# Patient Record
Sex: Female | Born: 1985 | Race: White | Hispanic: No | Marital: Single | Smoking: Former smoker
Health system: Southern US, Community
[De-identification: ages and names within clinical notes are randomized; demographics above are authoritative.]

## PROBLEM LIST (undated history)

## (undated) DIAGNOSIS — F32A Depression, unspecified: Secondary | ICD-10-CM

## (undated) DIAGNOSIS — F319 Bipolar disorder, unspecified: Secondary | ICD-10-CM

## (undated) DIAGNOSIS — F329 Major depressive disorder, single episode, unspecified: Secondary | ICD-10-CM

## (undated) DIAGNOSIS — F419 Anxiety disorder, unspecified: Secondary | ICD-10-CM

## (undated) DIAGNOSIS — I10 Essential (primary) hypertension: Secondary | ICD-10-CM

## (undated) HISTORY — PX: TONSILLECTOMY: SUR1361

## (undated) HISTORY — PX: WISDOM TOOTH EXTRACTION: SHX21

## (undated) HISTORY — DX: Bipolar disorder, unspecified: F31.9

## (undated) HISTORY — PX: DILATION AND CURETTAGE OF UTERUS: SHX78

---

## 2007-02-26 ENCOUNTER — Other Ambulatory Visit: Admission: RE | Admit: 2007-02-26 | Discharge: 2007-02-26 | Payer: Self-pay | Admitting: Family Medicine

## 2008-05-30 ENCOUNTER — Inpatient Hospital Stay (HOSPITAL_COMMUNITY): Admission: EM | Admit: 2008-05-30 | Discharge: 2008-05-31 | Payer: Self-pay | Admitting: Emergency Medicine

## 2011-03-22 ENCOUNTER — Inpatient Hospital Stay (HOSPITAL_COMMUNITY): Admission: AD | Admit: 2011-03-22 | Payer: Self-pay | Source: Home / Self Care | Admitting: Family Medicine

## 2011-04-05 NOTE — Consult Note (Signed)
Ana Best, WARWICK             ACCOUNT NO.:  [id number]   MEDICAL RECORD NO.:  [id number]          PATIENT TYPE:  INP   LOCATION:  2917                         FACILITY:  [facility]   PHYSICIAN:  Antonietta Breach, M.D.  DATE OF BIRTH:  1986/11/03   DATE OF CONSULTATION:  05/30/2008  DATE OF DISCHARGE:                                 CONSULTATION   REASON FOR CONSULTATION:  Drug overdose.   REQUESTING PHYSICIAN:  [employer], Ramiro Harvest.   HISTORY OF PRESENT ILLNESS:  Ms. Lagan is a 25 year old female  admitted to the [facility] on May 30, 2008, after a heroin  overdose.   The patient states that she drank too much alcohol and then took her  normal quantity of heroin IV.  She had no intention of killing herself.  She has not had any suicidal thoughts.  She concludes that the heroin  that she received was unusually potent.  The patient was discovered by a  friend quickly after the patient became comatose.  The friend applied  CPR.  By the time that emergency medical services arrived, the patient  was in cardiac arrest.  CPR was continued, and the patient was  intubated.  She became conscious and try to extubate herself in the  emergency room.  Once she was consistently conscious, she was extubated.   By the time of the undersigned's visit, the patient has remained alert,  oriented and extubated.   The patient does not have any thoughts of harming herself or others.  She has no hallucinations or delusions.  Her orientation is intact.  Her  memory function is normal.  She is cooperative with bedside care.   She has constructive regret about her substance abuse and a desire to  enter sobriety and abstinence from substance abuse.   PAST PSYCHIATRIC HISTORY:  The patient has successfully been treated  with Wellbutrin for depression.  She states that her maintenance dosage  is 150 mg SR b.i.d.  Previous trials with Prozac resulted in a very  negative emotional  state.   Therefore, for her anxiety, she has been treated with Klonopin.  She  denies ever abusing the Klonopin.  She states that she rarely uses  alcohol.   She states that her anxiety is intolerable and that the Klonopin has  helped her to maintain a normal life.   She does admit that she became involved in the heroin habit recently  with her ex-boyfriend.   FAMILY PSYCHIATRIC HISTORY:  None known.   SOCIAL HISTORY:  The patient has 3-1/2 years of college.  She is getting  ready to re-enroll at [employer].  She has single. She has been staying with  a friend.   PAST MEDICAL HISTORY:  Status post heroin overdose and cardiac arrest.   MEDICATIONS:  MAR is reviewed.  The patient is on:  1. Wellbutrin 100 mg b.i.d.  2. Klonopin 0.5 mg b.i.d.  3. She is also on an Ativan taper.  4. Thiamine 100 mg daily.  5. A multivitamin daily.   No known drug allergies.   Her TCA screen  was positive.  INR normal.  Platelets 273, hemoglobin  12.1.  SGOT 35, SGPT 23.  Urine drug screen was positive for THC,  opioids and benzodiazepines.  Alcohol level was 145 upon arrival to the  emergency room.  Urine HCG negative.  Tylenol and aspirin negative.   REVIEW OF SYSTEMS:  Noncontributory.   MENTAL STATUS EXAM:  Ms. Louis is alert and oriented to all spheres.  Her affect is broad and appropriate.  Mood within normal limits.  Memory  function intact except for the blackout.  Speech normal.  Thought  process:  Logical, coherent, goal-directed.  No looseness of  associations.  Thought content:  No thoughts of harming herself, no  thoughts of harming others, no delusions, no hallucinations.  Insight is  intact.  Judgment intact.   ASSESSMENT:  AXIS I:  293.84 anxiety disorder not otherwise specified.  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  Opioid dependence, polysubstance dependence.  AXIS II:  Deferred.  AXIS III:  See past medical history above.  AXIS IV:  Primary support  group.  AXIS V:  55.   Ms. Tineo is not at risk to harm herself or others.  She does agree to  call emergency services immediately for any thoughts of harming herself,  thoughts of harming others or other psychiatric emergency symptoms.   The undersigned provided ego-supportive psychotherapy and education.  The undersigned recommended that the patient enter an inpatient chemical  dependency rehabilitation program.  However, the patient declined, and  she is no longer committable now that she is recovered from her  intoxicated state.   The patient wants to enter outpatient 12-step method and groups.   RECOMMENDATIONS:  Would increase the patient's Wellbutrin back to 150 mg  SR p.o. b.i.d. which has been her affect antidepressant prevention  dosage.  Would ask the social worker to set this patient up with  outpatient psychiatric follow-up which can be found at one of the  clinics attached to [facility], [facility] or [facility].  Also, the [facility] mental health center is an option.   Would ask the social worker to help this patient obtain 12-step  materials and information on meetings.     Antonietta Breach, M.D.  Electronically Signed    JW/MEDQ  D:  05/30/2008  T:  05/30/2008  Job:  [id number]

## 2011-04-05 NOTE — Discharge Summary (Signed)
[name], Best NO.:  [id number]   MEDICAL RECORD NO.:  [id number]          PATIENT TYPE:  INP   LOCATION:  2917                         FACILITY:  [facility]   PHYSICIAN:  Ramiro Harvest, MD    DATE OF BIRTH:  1985/12/05   DATE OF ADMISSION:  05/30/2008  DATE OF DISCHARGE:  05/31/2008                               DISCHARGE SUMMARY   ATTENDING PHYSICIAN:  Ramiro Harvest, MD.   PRIMARY CARE PHYSICIAN:  Duncan Dull, MD, of [facility].   DISCHARGE DIAGNOSES:  1. Drug overdose.  2. Depression.  3. Anxiety.  4. Polysubstance abuse.  5. Hypokalemia.   DISCHARGE MEDICATIONS:  1. Wellbutrin SR 150 mg p.o. b.i.d.  2. Klonopin 0.5 mg 1-1/2 tablets p.o. q.a.m. and q.p.m.  3. Ambien 10 mg p.o. nightly.  4. Tylenol Extra Strength 500 mg 1 tablet every 4 hours as needed for      pain.   DISPOSITION AND FOLLOWUP:  The patient will be discharged home under the  care of her mother to her mother's home.  The patient was advised of a  12-step program.  The patient will need outpatient psychiatric followup  at one of the clinics attached to [facility] or  [facility] as an option.  The  patient is also to follow up with PCP in 2 weeks, followup basic  metabolic profile needs to be checked, to followup on the patient's  electrolytes, and also to followup on the patient's polysubstance abuse  to make sure that the patient has followed up with outpatient  psychiatric program and our possible 12-step program as well.   CONSULTANTS:  Dr. Jeanie Sewer of psychiatry was consulted.  The patient  was seen on May 30, 2008.   PROCEDURES PERFORMED:  A chest x-ray was performed on May 30, 2008,  that showed no evidence of acute cardiopulmonary disease.   BRIEF ADMISSION HISTORY AND PHYSICAL:  Ana Best is a 25 year old  white female with history of polysubstance abuse who presented to the  ED; via EMS, in  cardiopulmonary arrest secondary to a drug overdose.  History was obtained from the patient and per ED records.  The patient  stated that she shot a dose of heroin the night prior to admission and  went unresponsive and cannot remember after that.  Per records of  friends/family members, started CPR and called 911; when paramedics  arrived, the patient was unresponsive, apneic, and pulseless.  The  patient was intubated immediately and CPR was continued for 5 minutes.  Pulse was noted and the blood pressure 115 was palpated.  The patient  was brought to the emergency room.  In the emergency room, the patient  was following commands, alert and oriented, and seemed neurologically  intact and thus extubated.  The patient was talking and following  commands.  The patient states that she had been under a lot of stress  recently and had used a dose of heroin.  The patient denied any chest  pain.  No shortness of breath, no nausea, no vomiting, no abdominal  pain, no hematemesis, no hematochezia, no focal neurological deficits,  and no other associated symptoms.  Labs obtained in the ED showed a EKG  with normal sinus rhythm.  Chest x-ray was normal.  CBC within normal  limits.  Sodium of 132, potassium of 2.9, chloride of 98, bicarb 80, BUN  9, creatinine 0.79, glucose 167, bilirubin 1.3, and rest of the liver  function tests were within normal limits.  Point-of-care cardiac markers  were negative.  UA was planned.  Acetaminophen level was less than 10.  Salicylate level was less than 4.  Alcohol level was 145.  UDS was  positive for benzos, opiates, and THCs.  Serum TCA's were also positive.  We called to admit the patient for further evaluation and management.   PHYSICAL EXAMINATION ON ADMISSION:  VITAL SIGNS:  Temperature 97.6,  blood pressure 135/78, pulse of 99, respiratory rate 16, and satting  100% on 2 L nasal cannula.  GENERAL:  The patient in bed, crying, speaking in full sentences,  in no  apparent distress.  HEENT:  Normocephalic and atraumatic.  Pupils are equal, round, and  reactive to light.  Sluggishly, extraocular movements intact.  Oropharynx was clear.  No lesions.  No exudate.  NECK:  Supple.  No lymphadenopathy.  LUNGS:  Clear to auscultation bilaterally.  No wheezes, crackles, or  rhonchi.  CARDIOVASCULAR:  Regular rate and rhythm.  No murmurs, rubs, or gallops.  ABDOMEN:  Soft, nontender, and nondistended.  Positive bowel sounds.  No  rebound or guarding.  EXTREMITIES:  No clubbing, cyanosis, or edema.  NEURO:  The patient was alert and oriented x3.  Cranial nerves II-XII  were grossly intact.  No focal deficits.  Sensation is intact.  Cerebellum is intact.  Gait was not tested.   ADMISSION LABORATORY DATA:  White count 9.7, hemoglobin 12.1, platelets  273, hematocrit 35.4, sodium 132, potassium 2.9, chloride 98, bicarb 18,  BUN 9, creatinine 0.79, glucose 167, calcium of 8.5, bilirubin 1.3, alk  phosphatase 48, AST 35, ALT 23, protein 6.8, and albumin 4.4.  Point-of-  care cardiac markers; CK-MB less than 1, troponin-I less than 0.05, and  myoglobin 30.7.  Urine pregnancy was negative.  Acetaminophen level was  less than 10.  Salicylate level less than 4.  Alcohol level 145.  Amylase 20.  UA was yellow and clear.  Specific gravity of 1.011, pH of  5, glucose negative, bilirubin negative, ketones negative, blood  negative, protein negative, urobilinogen 0.2, nitrite negative, and  leukocytes negative.  UDS was positive for benzos, opiates, and THCs.  Serum tricyclic was positive.  Chest x-ray showed no evidence of acute  cardiopulmonary disease.  EKG was normal sinus rhythm with a T-wave  inversion in lead III only.   HOSPITAL COURSE:  1. Drug overdose.  The patient was admitted to the step-down unit for      close observation.  Urine checked every 2 hours and magnesium level      was checked, which was 2.0.  TSH was checked, which was within       normal limits.  The patient was placed on seizure precautions,      placed on oxygen, and IV fluids.  The patient's potassium was also      monitored as well.  The patient remained stable and psychiatric      consult was also obtained.  The patient was seen in consultation by      Dr. Jeanie Sewer for drug overdose.  It  was felt that the patient was      not at risk to harm herself or others.  The patient agreed to call      emergency services immediately if she had any thoughts of hurting      herself or hurting others, and also to call for other psychiatric      emergency symptoms.  The patient was provided some supportive      psychotherapy information and education.  It was recommended to the      patient to enter inpatient chemical dependency rehab program;      however, the patient declined.  The patient was not committable at      that time and the patient had recovered from her intoxicated state.      The patient wanted to attend outpatient 12-method step group and      the patient was also advised to follow up with outpatient      psychiatric group.  The patient remained stable.  There were no      evidence of seizures throughout the hospitalization.  The patient's      respiratory status was stable throughout the hospitalization.  The      patient did not have any arrhythmias during the hospitalization and      the patient will be discharged in stable and improved condition.  2. Depression, stable.  The patient was maintained on her home dose of      Wellbutrin SR 150 mg twice a day.  3. Anxiety, stable.  The patient was put on some Ativan as needed, and      the patient was monitored.  4. Polysubstance abuse, stable.  See on problem #1.  5. Hypokalemia.  The patient's potassium was repeated and magnesium      level was checked, which was within normal limits.  The patient      will be discharged home in stable and improved condition to the      care of her mother.   DISCHARGE  VITAL SIGNS:  Vital signs on the day of discharge; temperature  98.3, blood pressure 102/56, pulse of 65, respiratory rate 17, and  satting 98% on room air.   DISCHARGE LABS:  Sodium 136, potassium 3.7, chloride 106, bicarb 23, BUN  4, creatinine 0.63, glucose of 77, bilirubin 1.3, alk phosphatase 43,  AST 20, ALT 18, protein 5.6, albumin 3.5, and calcium of 8.3.  CBC:  White count 9.0, hemoglobin 10.8, platelets 201, hematocrit 31.8, and  magnesium of 2.0.   It was a pleasure taking care of Ana Best.      Ramiro Harvest, MD  Electronically Signed     DT/MEDQ  D:  05/31/2008  T:  05/31/2008  Job:  [id number]   cc:   Duncan Dull, M.D.  Antonietta Breach, M.D.

## 2011-04-05 NOTE — H&P (Signed)
NAMEOMAYA, Best             ACCOUNT NO.:  [id number]   MEDICAL RECORD NO.:  [id number]          PATIENT TYPE:  INP   LOCATION:  2917                         FACILITY:  [facility]   PHYSICIAN:  Donnetta Hutching, MD         DATE OF BIRTH:  [birth year]   DATE OF ADMISSION:  05/30/2008  DATE OF DISCHARGE:                              HISTORY & PHYSICAL   PRIMARY CARE PHYSICIAN:  Duncan Dull, M.D.   HISTORY OF PRESENT ILLNESS:  Ana Best is a 25 year old white  female with history of polysubstance abuse who presented to the ED via  EMS in cardiopulmonary arrest secondary to a drug overdose.  History was  obtained from the patient and ED records.  The patient states she shot a  dose of heroin last night and went unresponsive.  Per records,  friends/family members started CPR and called 911.  When paramedics  arrived, the patient was unresponsive, apneic, and pulseless.  The  patient was intubated immediately, and CPR was continued for 5 minutes,  pulse was noted, and a blood pressure of 115 was palpated.  The patient  was brought to the emergency room.  In the ED, the patient was pulling  at the tube,  following commands, alert and oriented, and seemed  neurologically intact.  The patient was thus extubated, talking, and  following commands.  The patient states that she has been under a lot of  stress recently. Patient denied any chest pain, no SOB, no cough, no  nausea no diarrhea, no constipation, no abdominal pain, no hemetemesis,  no melena, no hematochezia, no focal neurological sxs, no other  associated symptoms.   Labs obtained in the ED:  EKG normal sinus rhythm.  Chest x-ray was  normal.  CBC was within normal limits.  Sodium of 132, potassium of 2.9,  chloride of 98, bicarb 80, BUN 9, creatinine 0.79, glucose 167,  bilirubin 1.3.  Rest of the liver function tests were within normal  limits.  Point-of-care cardiac markers were negative.  UA was bland.  Acetaminophen level  less than 10.  Salicylate level less than 4.  Alcohol level 145.  UDS was positive for benzos, opiates, and THCs.  TCAs was also positive.  We were called to admit the patient for further  evaluation and management.   ALLERGIES:  No known drug allergies.   PAST MEDICAL HISTORY:  1. Polysubstance abuse.  2. Depression.  3. Anxiety.   HOME MEDICATIONS:  1. The patient is on Wellbutrin, dose unknown.  2. Klonopin 0.5 mg p.o. q.a.m. and q.p.m.  3. Ambien 10 mg p.o. nightly.   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  Positive tobacco use, positive alcohol use, positive  IV drug use of heroin.  Also, uses cocaine, marijuana, and ecstasy.   FAMILY HISTORY:  Mother alive at age 58 and healthy.  Father alive at  age 53 with anxiety.  One brother alive at age 66 with OCD.   REVIEW OF SYSTEMS:  As per HPI, otherwise negative.  A 14-point review  of systems reviewed and was negative.   PHYSICAL EXAMINATION:  VITALS:  Temperature 97.6, blood pressure 135/78,  pulse of 99, respiratory rate 16, satting 100% on 2 L nasal cannula.  GENERAL:  Patient in bed, crying, speaking in full sentences, in no  apparent distress.  HEENT:  Normocephalic, atraumatic.  Pupils equal, round, and reactive to  light sluggishly.  Extraocular movements intact.  Oropharynx is clear.  No lesions, no exudates.  NECK:  Supple.  No lymphadenopathy.  LUNGS:  Clear to auscultation bilaterally.  No wheezes, no crackles.  No  rhonchi.  CARDIOVASCULAR:  Regular rate and rhythm.  No murmurs, rubs, or gallops.  ABDOMEN:  Soft, nontender, nondistended.  Positive bowel sounds.  No  rebound, no guarding.  EXTREMITIES:  No clubbing, cyanosis or edema.  NEUROLOGIC:  The patient is alert and oriented x3.  Cranial nerves II  through XII are grossly intact.  No focal deficit.  Sensation is intact.  Cerebellum is intact.  Gait not tested.   LABORATORY DATA:  CBC:  White count  9.7, hemoglobin 12.1, platelets 273,  hematocrit 35.4, sodium 132, potassium 2.9, chloride 98, bicarbonate 18,  BUN 9, creatinine 0.79, glucose 167, calcium of 8.5, bilirubin 1.3, alk  phosphatase 48, AST 35, ALT 23, protein 6.8, and albumin 4.4.  Point-of-  care cardiac markers, CK-MB less than 1, troponin I less than 0.05,  myoglobin 30.7.  Urine pregnancy test negative.  Acetaminophen level  less than 10.  Salicylate level less than 4.  Alcohol level 145.  Amylase of 20.  UA was yellow, clear, specific gravity 1.011, pH of 5,  glucose negative, bilirubin negative, ketones negative, blood negative,  protein negative, urobilinogen 0.2, nitrite negative, and leukocytes  negative.  UDS positive for benzos, opiates, and THC.  Serum tricyclics  was positive.  Chest x-ray:  No evidence of acute cardiopulmonary  disease.  EKG with normal sinus rhythm and T-wave inversion in lead III  only.   ASSESSMENT AND PLAN:  Ana Best is a 25 year old female with past  medical history of depression, anxiety, and polysubstance abuse who  presents to the ED with a cardiopulmonary arrest after a drug overdose.  1. Drug overdose.  We will admit the patient to step-down unit for      close observation.  Neuro checks every 2 hours.  Check a magnesium      level.  Check a TSH.  Check coags, seizure precautions.  Place on      oxygen, IV fluids.  Replete potassium and monitor.  We will get a      psychiatric consult for further evaluation.  The patient also is      referred for psychiatric evaluation.  The patient denies any      suicidal or homicidal ideation.  2. Depression.  The patient denies any suicidal or homicidal ideation.      We will continue home dose Wellbutrin.  We will get a psychiatric      consult.  3. Anxiety.  Continue home dose Klonopin.  4. Polysubstance abuse.  Monitor in step-down unit.  Seizure      precautions.  Ativan, withdrawal protocol, and psych consult.  5. Hypokalemia.   Check a magnesium level.  Replete potassium.  Recheck      BMET at 1300 hours.  6. Prophylaxis.  Protonix for GI prophylaxis.  SCDs for DVT      prophylaxis.   It has been a pleasure taking care of Ana Best.  Ramiro Harvest, MD  Electronically Signed     ______________________________  Donnetta Hutching, MD    DT/MEDQ  D:  05/30/2008  T:  05/30/2008  Job:  [id number]   cc:   Duncan Dull, M.D.

## 2011-08-18 LAB — RAPID URINE DRUG SCREEN, HOSP PERFORMED
Barbiturates: NOT DETECTED
Opiates: POSITIVE — AB
Tetrahydrocannabinol: POSITIVE — AB

## 2011-08-18 LAB — POCT PREGNANCY, URINE
Operator id: 277751
Preg Test, Ur: NEGATIVE

## 2011-08-18 LAB — DIFFERENTIAL
Lymphocytes Relative: 44
Monocytes Absolute: 0.7
Monocytes Relative: 8
Neutro Abs: 4.6

## 2011-08-18 LAB — URINALYSIS, ROUTINE W REFLEX MICROSCOPIC
Glucose, UA: NEGATIVE
Hgb urine dipstick: NEGATIVE
Specific Gravity, Urine: 1.011
Urobilinogen, UA: 0.2
pH: 5

## 2011-08-18 LAB — CBC
HCT: 35.4 — ABNORMAL LOW
MCHC: 34
MCV: 89.9
Platelets: 201
Platelets: 273
RDW: 11.8

## 2011-08-18 LAB — COMPREHENSIVE METABOLIC PANEL
AST: 20
Albumin: 4.4
Alkaline Phosphatase: 48
BUN: 4 — ABNORMAL LOW
BUN: 9
CO2: 23
Calcium: 8.3 — ABNORMAL LOW
Creatinine, Ser: 0.63
Creatinine, Ser: 0.79
GFR calc Af Amer: >60
GFR calc non Af Amer: >60
Potassium: 2.9 — ABNORMAL LOW
Total Protein: 6.8

## 2011-08-18 LAB — ACETAMINOPHEN LEVEL: Acetaminophen (Tylenol), Serum: <10 — ABNORMAL LOW

## 2011-08-18 LAB — POCT CARDIAC MARKERS
CKMB, poc: <1 — ABNORMAL LOW
Myoglobin, poc: 30.7
Troponin i, poc: <0.05

## 2011-08-18 LAB — BASIC METABOLIC PANEL
BUN: 6
CO2: 24
Chloride: 105
Creatinine, Ser: 0.56

## 2011-08-18 LAB — MAGNESIUM: Magnesium: 2

## 2011-08-18 LAB — PROTIME-INR: Prothrombin Time: 16.4 — ABNORMAL HIGH

## 2011-08-18 LAB — AMYLASE: Amylase: 20 — ABNORMAL LOW

## 2011-08-31 ENCOUNTER — Inpatient Hospital Stay (HOSPITAL_COMMUNITY)
Admission: AD | Admit: 2011-08-31 | Discharge: 2011-09-02 | DRG: 759 | Disposition: A | Discharge status: Home / Self Care | Payer: PRIVATE HEALTH INSURANCE | Source: Ambulatory Visit | Attending: Family Medicine | Admitting: Family Medicine

## 2011-08-31 ENCOUNTER — Encounter (HOSPITAL_COMMUNITY): Payer: Self-pay

## 2011-08-31 DIAGNOSIS — N760 Acute vaginitis: Principal | ICD-10-CM | POA: Diagnosis present

## 2011-08-31 DIAGNOSIS — N764 Abscess of vulva: Secondary | ICD-10-CM

## 2011-08-31 DIAGNOSIS — N762 Acute vulvitis: Secondary | ICD-10-CM

## 2011-08-31 DIAGNOSIS — N949 Unspecified condition associated with female genital organs and menstrual cycle: Secondary | ICD-10-CM | POA: Diagnosis present

## 2011-08-31 HISTORY — DX: Anxiety disorder, unspecified: F41.9

## 2011-08-31 HISTORY — DX: Major depressive disorder, single episode, unspecified: F32.9

## 2011-08-31 HISTORY — DX: Depression, unspecified: F32.A

## 2011-08-31 LAB — BASIC METABOLIC PANEL
BUN: 10 mg/dL (ref 6–23)
Calcium: 9.3 mg/dL (ref 8.4–10.5)
GFR calc non Af Amer: >90 mL/min (ref >90)
Glucose, Bld: 100 mg/dL — ABNORMAL HIGH (ref 70–99)

## 2011-08-31 MED ORDER — SODIUM CHLORIDE 0.9 % IJ SOLN: 3.0000 mL | INTRAMUSCULAR | Status: DC | PRN

## 2011-08-31 MED ORDER — OXYCODONE-ACETAMINOPHEN 5-325 MG PO TABS
1.0000 | ORAL_TABLET | ORAL | Status: DC | PRN
  Administered 2011-08-31 – 2011-09-01 (×5): 2 via ORAL
  Administered 2011-09-02: 1 via ORAL
  Filled 2011-08-31 (×2): qty 2
  Filled 2011-08-31: qty 1
  Filled 2011-08-31 (×3): qty 2

## 2011-08-31 MED ORDER — SODIUM CHLORIDE 0.9 % IJ SOLN
3.0000 mL | Freq: Two times a day (BID) | INTRAMUSCULAR | Status: DC
  Administered 2011-09-01 (×3): 3 mL via INTRAVENOUS

## 2011-08-31 MED ORDER — ARIPIPRAZOLE 10 MG PO TABS
10.0000 mg | ORAL_TABLET | Freq: Every day | ORAL | Status: DC
  Administered 2011-08-31 – 2011-09-01 (×2): 10 mg via ORAL
  Filled 2011-08-31 (×2): qty 1

## 2011-08-31 MED ORDER — PROMETHAZINE HCL 25 MG/ML IJ SOLN: 12.5000 mg | INTRAMUSCULAR | Status: DC | PRN

## 2011-08-31 MED ORDER — MORPHINE SULFATE 4 MG/ML IJ SOLN: 1.0000 mg | INTRAMUSCULAR | Status: DC | PRN

## 2011-08-31 MED ORDER — ALUM & MAG HYDROXIDE-SIMETH 200-200-20 MG/5ML PO SUSP: 30.0000 mL | ORAL | Status: DC | PRN

## 2011-08-31 MED ORDER — MENTHOL 3 MG MT LOZG: 1.0000 | LOZENGE | OROMUCOSAL | Status: DC | PRN

## 2011-08-31 MED ORDER — VANCOMYCIN HCL IN DEXTROSE 1-5 GM/200ML-% IV SOLN
1000.0000 mg | Freq: Two times a day (BID) | INTRAVENOUS | Status: DC
  Administered 2011-08-31 – 2011-09-02 (×4): 1000 mg via INTRAVENOUS
  Filled 2011-08-31 (×5): qty 200

## 2011-08-31 MED ORDER — ARIPIPRAZOLE 10 MG PO TABS
10.0000 mg | ORAL_TABLET | Freq: Every day | ORAL | Status: DC
  Filled 2011-08-31 (×2): qty 1

## 2011-08-31 MED ORDER — TEMAZEPAM 15 MG PO CAPS: 15.0000 mg | ORAL_CAPSULE | Freq: Every evening | ORAL | Status: DC | PRN

## 2011-08-31 MED ORDER — BISACODYL 10 MG RE SUPP: 10.0000 mg | Freq: Every day | RECTAL | Status: DC | PRN

## 2011-08-31 MED ORDER — IBUPROFEN 600 MG PO TABS
600.0000 mg | ORAL_TABLET | Freq: Four times a day (QID) | ORAL | Status: DC | PRN
  Administered 2011-09-02: 600 mg via ORAL
  Filled 2011-08-31: qty 1

## 2011-08-31 MED ORDER — PNEUMOCOCCAL VAC POLYVALENT 25 MCG/0.5ML IJ INJ
0.5000 mL | INJECTION | INTRAMUSCULAR | Status: DC
  Filled 2011-08-31: qty 0.5

## 2011-08-31 MED ORDER — CLONAZEPAM 1 MG PO TABS: 2.0000 mg | ORAL_TABLET | Freq: Every evening | ORAL | Status: DC | PRN

## 2011-08-31 MED ORDER — LAMOTRIGINE 150 MG PO TABS
150.0000 mg | ORAL_TABLET | Freq: Every day | ORAL | Status: DC
  Administered 2011-08-31 – 2011-09-01 (×2): 150 mg via ORAL
  Filled 2011-08-31 (×2): qty 1

## 2011-08-31 MED ORDER — SODIUM CHLORIDE 0.9 % IV SOLN
250.0000 mL | INTRAVENOUS | Status: DC
  Administered 2011-08-31: 1000 mL via INTRAVENOUS

## 2011-08-31 MED ORDER — GUAIFENESIN 100 MG/5ML PO SOLN: 15.0000 mL | ORAL | Status: DC | PRN

## 2011-08-31 MED ORDER — DOCUSATE SODIUM 100 MG PO CAPS
100.0000 mg | ORAL_CAPSULE | Freq: Every day | ORAL | Status: DC
  Administered 2011-08-31 – 2011-09-02 (×3): 100 mg via ORAL
  Filled 2011-08-31 (×3): qty 1

## 2011-08-31 MED ORDER — LAMOTRIGINE 150 MG PO TABS
150.0000 mg | ORAL_TABLET | Freq: Every day | ORAL | Status: DC
  Filled 2011-08-31 (×2): qty 1

## 2011-08-31 NOTE — H&P (Signed)
  Ana Best is an 25 y.o. [id number] Caucasian female.   Chief Complaint: Labial swelling and pain HPI: Patient comes in today with 3 day h/o labial swelling.  She states that it started out as a bump and she tried to pop it.  It is on the right side.  It has gotten increasingly bigger and more painful.  It is not draining.  She has not taken anything for it.  She denies fever, chills, headache, nausea, vomiting.  Past Medical History  Diagnosis Date  . Anxiety   . Depression     Past Surgical History  Procedure Date  . Tonsillectomy   . Dilation and curettage of uterus   . Wisdom tooth extraction     No family history on file. Social History:  does not have a smoking history on file. She does not have any smokeless tobacco history on file. Her alcohol and drug histories not on file.  Allergies: No Known Allergies  No current facility-administered medications on file as of 08/31/2011.   No current outpatient prescriptions on file as of 08/31/2011.    No results found for this or any previous visit (from the past 48 hour(s)). No results found.  A comprehensive review of systems was negative except for: Constitutional: positive for fatigue and appetite change Neurological: positive for headaches  Blood pressure 103/72, pulse 102, temperature 98.7 F (37.1 C), temperature source Oral, resp. rate 20, height 5\' 5"  (1.651 m), weight 65.318 kg (144 lb), last menstrual period 08/16/2011. General: alert and cooperative Resp: clear to auscultation bilaterally Cardio: regular rate and rhythm, S1, S2 normal, no murmur, click, rub or gallop GI: soft, non-tender; bowel sounds normal; no masses,  no organomegaly Extremities: extremities normal, atraumatic, no cyanosis or edema GU: reveals erythematous, indurated right labia majora, no fluctuance. HEENT:  [place]/AT, sclera anicteric Neck:  Supple, Nml thyroid Neuro:  Oriented x 3 Skin: C/D/I except in GU.  WBC: 20K at Urgent Care  today.  Assessment/Plan Right Labial abscess Admit for pain control and IV antibiotics.  Vern Prestia S 08/31/2011, 12:32 PM

## 2011-08-31 NOTE — ED Provider Notes (Signed)
Chart reviewed and agree with management and plan.  

## 2011-08-31 NOTE — ED Provider Notes (Signed)
History     CSN: [id number] Arrival date & time: 08/31/2011  9:37 AM  Chief Complaint  Patient presents with  . Bartholin's Cyst    HPI Ana Best is a 25 y.o. female who was evaluated earlier today at [facility] for swelling and pain of right labia. Symptoms started 3 days ago with small knot. Patient tried to squeeze the area to see if it would drain and since the it has gotten much worse. Uses OC's for birth control. No history of STD's. Last pap smear 2 years ago and was normal.  Past Medical History  Diagnosis Date  . Anxiety   . Depression     Past Surgical History  Procedure Date  . Tonsillectomy   . Dilation and curettage of uterus   . Wisdom tooth extraction     No family history on file.  History  Substance Use Topics  . Smoking status: Not on file  . Smokeless tobacco: Not on file  . Alcohol Use:     OB History    Grav Para Term Preterm Abortions TAB SAB Ect Mult Living   2    2 2           Review of Systems  Constitutional: Positive for appetite change and fatigue. Negative for fever, chills and diaphoresis.  HENT: Negative for ear pain, congestion, sore throat, neck pain, neck stiffness, dental problem and sinus pressure.   Eyes: Negative for photophobia, pain and discharge.  Respiratory: Negative for cough, chest tightness and wheezing.   Cardiovascular: Negative.   Gastrointestinal: Negative for nausea, vomiting, abdominal pain, diarrhea, constipation and abdominal distention.  Genitourinary: Negative for dysuria, frequency, flank pain and difficulty urinating.       Swelling and redness of right labia.  Musculoskeletal: Positive for back pain. Negative for myalgias and gait problem.  Skin:       See GU  Neurological: Positive for headaches. Negative for dizziness, speech difficulty, weakness, light-headedness and numbness.  Psychiatric/Behavioral: Negative for confusion and agitation. The patient is nervous/anxious.    Bipolar    Allergies  Review of patient's allergies indicates no known allergies.  Home Medications  No current outpatient prescriptions on file.  BP 103/72  Pulse 102  Temp(Src) 98.7 F (37.1 C) (Oral)  Resp 20  Ht 5\' 5"  (1.651 m)  Wt 144 lb (65.318 kg)  BMI 23.96 kg/m2  LMP 08/16/2011  Physical Exam  Nursing note and vitals reviewed. Constitutional: She is oriented to person, place, and time. She appears well-developed and well-nourished.  HENT:  Head: Normocephalic.  Eyes: EOM are normal.  Neck: Neck supple.  Cardiovascular:       tachycardia  Pulmonary/Chest: Effort normal.  Abdominal: Soft. There is no tenderness.  Genitourinary:       Right labia with edema, erythema and tenderness. I can not appreciate any swelling of the Bartholin's gland. There is no fluctuant area at this time to drain.   Musculoskeletal: Normal range of motion.       Right inguinal nodes tender.  Neurological: She is alert and oriented to person, place, and time. No cranial nerve deficit.  Skin: Skin is warm and dry.   Per Urgent Care documents the patient has an elevated white count. ED Course: Consult with Dr. Shawnie Pons and she evaluated the patient in [facility]. Will admit for IV antibiotics.  Procedures   Assessment: Right labial abscess   Cellulitis  Plan:  Admit for IV antibiotics   Dr. Shawnie Pons witting  admission orders. MDM          [name], NP 08/31/11 1245

## 2011-08-31 NOTE — Progress Notes (Signed)
Pt states noted small cyst 3 days ago, squeezed it, then cyst became larger. Whole r side of labia is red and swollen. Denies vag d/c changes. Did try warm bath.

## 2011-08-31 NOTE — Progress Notes (Signed)
Pt states she was seen at the [facility] and sent to [facility] for a Bartholin cyst. Pt is in a lot of pain and has been unable to sleep.

## 2011-09-01 DIAGNOSIS — N764 Abscess of vulva: Secondary | ICD-10-CM

## 2011-09-01 LAB — CBC
HCT: 32.8 % — ABNORMAL LOW (ref 36.0–46.0)
MCH: 30.6 pg (ref 26.0–34.0)
MCV: 92.9 fL (ref 78.0–100.0)
Platelets: 264 10*3/uL (ref 150–400)
RBC: 3.53 MIL/uL — ABNORMAL LOW (ref 3.87–5.11)
RDW: 12.8 % (ref 11.5–15.5)
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
## 2011-09-01 NOTE — Progress Notes (Signed)
  Ana Best is a 25 y.o. female patient. 1. Labial abscess    Past Medical History  Diagnosis Date  . Anxiety   . Depression    Current Facility-Administered Medications  Medication Dose Route Frequency Provider Last Rate Last Dose  . 0.9 %  sodium chloride infusion  250 mL Intravenous Continuous Reva Bores, MD 1 mL/hr at 08/31/11 1328 1,000 mL at 08/31/11 1328  . alum & mag hydroxide-simeth (MAALOX/MYLANTA) 200-200-20 MG/5ML suspension 30 mL  30 mL Oral Q4H PRN Reva Bores, MD      . ARIPiprazole (ABILIFY) tablet 10 mg  10 mg Oral QHS Reva Bores, MD   10 mg at 08/31/11 2214  . bisacodyl (DULCOLAX) suppository 10 mg  10 mg Rectal Daily PRN Reva Bores, MD      . clonazePAM [name]) tablet 2 mg  2 mg Oral QHS PRN Reva Bores, MD      . docusate sodium (COLACE) capsule 100 mg  100 mg Oral Daily Reva Bores, MD   100 mg at 08/31/11 1637  . guaiFENesin (ROBITUSSIN) 100 MG/5ML solution 300 mg  15 mL Oral Q4H PRN Reva Bores, MD      . ibuprofen (ADVIL,MOTRIN) tablet 600 mg  600 mg Oral Q6H PRN Reva Bores, MD      . lamoTRIgine (LAMICTAL) tablet 150 mg  150 mg Oral QHS Reva Bores, MD   150 mg at 08/31/11 2214  . menthol-cetylpyridinium (CEPACOL) lozenge 3 mg  1 lozenge Oral Q2H PRN Reva Bores, MD      . morphine 4 MG/ML injection 1-2 mg  1-2 mg Intravenous Q3H PRN Reva Bores, MD      . oxyCODONE-acetaminophen (PERCOCET) 5-325 MG per tablet 1-2 tablet  1-2 tablet Oral Q3H PRN Reva Bores, MD   2 tablet at 08/31/11 1832  . pneumococcal 23 valent vaccine (PNU-IMMUNE) injection 0.5 mL  0.5 mL Intramuscular Tomorrow-1000 Reva Bores, MD      . promethazine (PHENERGAN) injection 12.5 mg  12.5 mg Intravenous Q4H PRN Reva Bores, MD      . sodium chloride 0.9 % injection 3 mL  3 mL Intravenous Q12H Reva Bores, MD   3 mL at 09/01/11 0154  . sodium chloride 0.9 % injection 3 mL  3 mL Intravenous PRN Reva Bores, MD      . temazepam (RESTORIL) capsule 15-30  mg  15-30 mg Oral QHS PRN Reva Bores, MD      . vancomycin (VANCOCIN) IVPB 1000 mg/200 mL premix  1,000 mg Intravenous Q12H Reva Bores, MD   1,000 mg at 09/01/11 0154  . DISCONTD: ARIPiprazole (ABILIFY) tablet 10 mg  10 mg Oral Daily Reva Bores, MD      . DISCONTD: lamoTRIgine (LAMICTAL) tablet 150 mg  150 mg Oral Daily Reva Bores, MD       No Known Allergies Active Problems:  * No active hospital problems. *   Blood pressure 94/54, pulse 68, temperature 98.5 F (36.9 C), temperature source Oral, resp. rate 18, height 5\' 5"  (1.651 m), weight 65.772 kg (145 lb), last menstrual period 08/16/2011, SpO2 97.00%.  Subjective: Still having some shooting pains in vulva, but overall somewhat improved  Objective: generalized fullness of left vulva, no discrete pocket Assessment & Plan: Left labial abscess- change to dicloxacillin as she is MRSA negative  [name] C. 09/01/2011

## 2011-09-01 NOTE — Progress Notes (Signed)
  Pt examined at 9 am today.  Area marked with black marker.  At 7 pm, the area is slightly improved (approx 10%) better.  Pt still having lots of pain.  No fluctuant area for I & D.  Pt advised to stay and receive more IV antibiotics.  Pt and mother agreeable.  Reiterated no more shaving. Adelae Yodice H. 7:11 PM

## 2011-09-02 DIAGNOSIS — N762 Acute vulvitis: Secondary | ICD-10-CM

## 2011-09-02 MED ORDER — OXYCODONE-ACETAMINOPHEN 5-325 MG PO TABS: 1.0000 | ORAL_TABLET | ORAL | Status: AC | PRN

## 2011-09-02 MED ORDER — SULFAMETHOXAZOLE-TRIMETHOPRIM 800-160 MG PO TABS: 1.0000 | ORAL_TABLET | Freq: Two times a day (BID) | ORAL | Status: AC

## 2011-09-02 NOTE — Discharge Summary (Signed)
Physician Discharge Summary  Patient ID: Ana Best MRN: [id number] DOB/AGE: [birth year] 25 y.o.  Admit date: 08/31/2011 Discharge date: 09/02/2011  Admission Diagnoses:  Discharge Diagnoses:  Active Problems:  Cellulitis of labia majora   Discharged Condition: good  Hospital Course: Pt admitted 2 days ago for pain and swelling of left labia.  Pt had elevated white count.  Diagnosis consistent with labia majora cellulitis.  There was no fluctuant area.  Pt placed on antibiotics for 2 days with 75% improvement.  Will continue antibiotics for 10 days at home.  Consults: none  Significant Diagnostic Studies: labs: cbc  Treatments: antibiotics: vancomycin  Discharge Exam: Blood pressure 95/58, pulse 69, temperature 97.7 F (36.5 C), temperature source Oral, resp. rate 16, height 5\' 5"  (1.651 m), weight 68.04 kg (150 lb), last menstrual period 08/16/2011, SpO2 97.00%.  See progress note  Disposition:    Current Discharge Medication List    START taking these medications   Details  oxyCODONE-acetaminophen (ROXICET) 5-325 MG per tablet Take 1 tablet by mouth every 4 (four) hours as needed for pain. Qty: 15 tablet, Refills: 0    sulfamethoxazole-trimethoprim (BACTRIM DS) 800-160 MG per tablet Take 1 tablet by mouth 2 (two) times daily. Qty: 20 tablet, Refills: 0      CONTINUE these medications which have NOT CHANGED   Details  ARIPiprazole (ABILIFY) 10 MG tablet Take 10 mg by mouth daily.      clonazePAM (KLONOPIN) 2 MG tablet Take 2 mg by mouth at bedtime as needed. Patient takes for anxiety     lamoTRIgine (LAMICTAL) 150 MG tablet Take 150 mg by mouth daily.         Follow-up Information    Follow up with [facility] [place]. Make an appointment in 2 weeks. [phone])          SignedElsie Lincoln H. 09/02/2011, 7:16 AM

## 2011-09-02 NOTE — Progress Notes (Signed)
Subjective: Patient reports decrease in pain and swelling  Objective: I have reviewed patient's vital signs, medications, labs and microbiology.  General: alert, cooperative and no distress GI: normal findings: nontender Extremities: extremities normal, atraumatic, no cyanosis or edema Right labia has decreased in size by half.  Less tender.   Assessment/Plan: 25 yo female with right labia majora cellulitis -stable to d/c home. -Bactrim DS bid for 10 days. -Advised to return if area worsens.  LOS: 2 days    Giovanny Dugal H. 09/02/2011, 6:05 AM

## 2012-01-18 ENCOUNTER — Ambulatory Visit: Payer: PRIVATE HEALTH INSURANCE | Admitting: Family Medicine

## 2012-01-18 VITALS — BP 110/64 | HR 66 | Temp 98.4°F | Resp 16 | Ht 66.0 in | Wt 151.4 lb

## 2012-01-18 DIAGNOSIS — N390 Urinary tract infection, site not specified: Secondary | ICD-10-CM

## 2012-01-18 DIAGNOSIS — R350 Frequency of micturition: Secondary | ICD-10-CM

## 2012-01-18 DIAGNOSIS — F411 Generalized anxiety disorder: Secondary | ICD-10-CM

## 2012-01-18 DIAGNOSIS — F419 Anxiety disorder, unspecified: Secondary | ICD-10-CM

## 2012-01-18 LAB — POCT UA - MICROSCOPIC ONLY: Yeast, UA: NEGATIVE

## 2012-01-18 LAB — POCT URINALYSIS DIPSTICK
Protein, UA: 100
Urobilinogen, UA: 0.2

## 2012-01-18 MED ORDER — CLONAZEPAM 2 MG PO TABS: 2.0000 mg | ORAL_TABLET | Freq: Three times a day (TID) | ORAL | Status: DC | PRN

## 2012-01-18 MED ORDER — PHENAZOPYRIDINE HCL 100 MG PO TABS: 100.0000 mg | ORAL_TABLET | Freq: Three times a day (TID) | ORAL | Status: AC | PRN

## 2012-01-18 MED ORDER — CIPROFLOXACIN HCL 500 MG PO TABS: 500.0000 mg | ORAL_TABLET | Freq: Two times a day (BID) | ORAL | Status: AC

## 2012-01-18 NOTE — Progress Notes (Signed)
Subjective:    Patient ID: Ana Best, female    DOB: 05/27/1986, 26 y.o.   MRN: [id number]  HPI Ana Best is a 26 y.o. female Urinary frequency, dysuria for 2 days,  Cold chills yesterday, but no known fever.  Nausea, no vomiting.  No abd/pelvic pain.  3-4 UTI's per year.  Prior on post-coital antibiotic that helped sx's, but hasn't refilled this - unknown name.  Needs sprintec refilled. Doesn't run out until May - last pap smear a year or two ago - normal.  Always normal pap smears, and no hx of STI's.  Same partner for 2 years.  Anxiety - takes Klonopin 2mg  tid last filled January 21st,  Took last dose last night.  sees Dr. Milana Kidney for Abilify and Lamictal, but has Klonopin filled here.  Denies other prescriber of controlled meds.  Student teacher - pre-K. No illicit drug use.  Review of Systems  Constitutional: Positive for chills. Negative for fever.  Genitourinary: Positive for dysuria, urgency and frequency. Negative for pelvic pain.       Objective:   Physical Exam  Constitutional: She is oriented to person, place, and time. She appears well-developed and well-nourished.  HENT:  Head: Normocephalic and atraumatic.  Cardiovascular: Normal rate.   Pulmonary/Chest: Effort normal.  Abdominal: Soft. Bowel sounds are normal. She exhibits no distension. There is no tenderness. There is no rebound and no CVA tenderness.  Neurological: She is alert and oriented to person, place, and time.  Skin: Skin is warm and dry.  Psychiatric: She has a normal mood and affect. Her behavior is normal. Thought content normal.   Results for orders placed in visit on 01/18/12  POCT URINALYSIS DIPSTICK      Component Value Range   Color, UA yellow     Clarity, UA turbid     Glucose, UA negative     Bilirubin, UA negative     Ketones, UA negative     Spec Grav, UA 1.025     Blood, UA moderate     pH, UA 7.0     Protein, UA 100     Urobilinogen, UA 0.2     Nitrite, UA positive      Leukocytes, UA moderate (2+)    POCT UA - MICROSCOPIC ONLY      Component Value Range   WBC, Ur, HPF, POC TNTC     RBC, urine, microscopic TNTC     Bacteria, U Microscopic 4+     Mucus, UA negative     Epithelial cells, urine per micros 2-6     Crystals, Ur, HPF, POC negative     Casts, Ur, LPF, POC negative     Yeast, UA negative           Assessment & Plan:  UTI- doubt pyelo as afebrile, no cvat.  Start cipro 500BID x 5days, but if any persistence of sx's, rtc. Return to the clinic or go to the nearest emergency room if  symptoms worsen or new symptoms occur.  Understanding expressed. Pt to check status of post coital abx rx as may have refills  If not, will call back to discuss options.  Anxiety/Depression - Bipolar D/O - managed by Dr. Tomasa Rand with Abilify and Lamictal, and Klonopin here. CSRS database reviewed - no concerning findings.  Rx Klonopin 2mg  #90, 1 rf only, then needs to follow up with Dr. Merla Riches.  Contraceptive Counseling - has 3 more months sprintec, will RTC/schedule appt as may need  pap/pelvic.

## 2012-01-22 LAB — URINE CULTURE

## 2012-03-23 ENCOUNTER — Other Ambulatory Visit: Payer: Self-pay | Admitting: Family Medicine

## 2012-03-30 ENCOUNTER — Ambulatory Visit (INDEPENDENT_AMBULATORY_CARE_PROVIDER_SITE_OTHER): Payer: PRIVATE HEALTH INSURANCE | Admitting: Family Medicine

## 2012-03-30 VITALS — BP 117/81 | HR 91 | Temp 98.2°F | Resp 18 | Ht 66.0 in | Wt 146.0 lb

## 2012-03-30 DIAGNOSIS — IMO0001 Reserved for inherently not codable concepts without codable children: Secondary | ICD-10-CM

## 2012-03-30 DIAGNOSIS — F32A Depression, unspecified: Secondary | ICD-10-CM

## 2012-03-30 DIAGNOSIS — F341 Dysthymic disorder: Secondary | ICD-10-CM

## 2012-03-30 DIAGNOSIS — R300 Dysuria: Secondary | ICD-10-CM

## 2012-03-30 DIAGNOSIS — N3090 Cystitis, unspecified without hematuria: Secondary | ICD-10-CM

## 2012-03-30 DIAGNOSIS — Z111 Encounter for screening for respiratory tuberculosis: Secondary | ICD-10-CM

## 2012-03-30 DIAGNOSIS — F329 Major depressive disorder, single episode, unspecified: Secondary | ICD-10-CM

## 2012-03-30 DIAGNOSIS — F319 Bipolar disorder, unspecified: Secondary | ICD-10-CM

## 2012-03-30 DIAGNOSIS — Z309 Encounter for contraceptive management, unspecified: Secondary | ICD-10-CM

## 2012-03-30 LAB — COMPREHENSIVE METABOLIC PANEL WITH GFR
AST: 35 U/L (ref 0–37)
Albumin: 4.6 g/dL (ref 3.5–5.2)
Alkaline Phosphatase: 39 U/L (ref 39–117)
Calcium: 9.6 mg/dL (ref 8.4–10.5)
Chloride: 102 meq/L (ref 96–112)
Glucose, Bld: 86 mg/dL (ref 70–99)
Potassium: 4.3 meq/L (ref 3.5–5.3)
Sodium: 137 meq/L (ref 135–145)
Total Protein: 7.2 g/dL (ref 6.0–8.3)

## 2012-03-30 LAB — POCT URINALYSIS DIPSTICK
Bilirubin, UA: NEGATIVE
Glucose, UA: NEGATIVE
Leukocytes, UA: NEGATIVE
Nitrite, UA: NEGATIVE
pH, UA: 5.5

## 2012-03-30 LAB — COMPREHENSIVE METABOLIC PANEL
ALT: 46 U/L — ABNORMAL HIGH (ref 0–35)
BUN: 14 mg/dL (ref 6–23)
CO2: 25 mEq/L (ref 19–32)
Creat: 0.77 mg/dL (ref 0.50–1.10)
Total Bilirubin: 0.7 mg/dL (ref 0.3–1.2)

## 2012-03-30 LAB — POCT UA - MICROSCOPIC ONLY: Yeast, UA: NEGATIVE

## 2012-03-30 LAB — TSH: TSH: 0.942 u[IU]/mL (ref 0.350–4.500)

## 2012-03-30 MED ORDER — CLONAZEPAM 2 MG PO TABS: 2.0000 mg | ORAL_TABLET | Freq: Three times a day (TID) | ORAL | Status: DC | PRN

## 2012-03-30 MED ORDER — NORGESTIMATE-ETH ESTRADIOL 0.25-35 MG-MCG PO TABS: 1.0000 | ORAL_TABLET | Freq: Every day | ORAL | Status: DC

## 2012-03-30 NOTE — Progress Notes (Signed)
[facility]:  Office Visit  Chief Complaint:  Chief Complaint  Patient presents with  . Annual Exam    with TB test    HPI: Ana Best is a 26 y.o. female who complains of: 1. Work PE-TB screening 2. Refill of Clonazepam and birth control-will see Dr. Merla Best (last visit was in October for refill) , last LMP 10 days ago, she is still on birth control. She is not up to date on pap and annual 3. Dysuria without any other symptoms  Patient denies SI/HI,  any hallucinations. She is stable on meds and also sees Dr. Tomasa Best regular for her psych appointments. She only gets her Cl[facility]azepam from [facility], she gets her Abilify and Lamictal from Dr. Tomasa Best however has not had any recent labs done while on meds.    Past Medical History  Diagnosis Date  . Anxiety   . Depression   . Bipolar 1 disorder    Past Surgical History  Procedure Date  . Tonsillectomy   . Dilation and curettage of uterus   . Wisdom tooth extraction    History   Social History  . Marital Status: Single    Spouse Name: N/A    Number of Children: N/A  . Years of Education: N/A   Social History Main Topics  . Smoking status: Current Everyday Smoker -- 0.5 packs/day for 10 years    Types: Cigarettes  . Smokeless tobacco: Never Used  . Alcohol Use: Yes  . Drug Use: No  . Sexually Active: Yes -- Female partner(s)   Other Topics Concern  . None   Social History Narrative  . None   No family history on file. No Known Allergies Prior to Admission medications   Medication Sig Start Date End Date Taking? Authorizing Provider  ARIPiprazole (ABILIFY) 10 MG tablet Take 15 mg by mouth daily.    Yes Historical Provider, MD  clonazePAM (KLONOPIN) 2 MG tablet Take 1 tablet (2 mg total) by mouth 3 (three) times daily as needed for anxiety. Needs office visit for additional refills. 03/23/12  Yes Ana S Jeffery, PA-C  lamoTRIgine (LAMICTAL) 150 MG tablet Take 150 mg by mouth daily.     Yes  Historical Provider, MD  norgestimate-ethinyl estradiol (ORTHO-CYCLEN,SPRINTEC,PREVIFEM) 0.25-35 MG-MCG tablet Take 1 tablet by mouth daily.   Yes Historical Provider, MD     ROS: The patient denies fevers, chills, night sweats, unintentional weight loss, chest pain, palpitations, wheezing, dyspnea on exertion, nausea, vomiting, abdominal pain, dysuria, hematuria, melena, numbness, weakness, or tingling.   All other systems have been reviewed and were otherwise negative with the exception of those mentioned in the HPI and as above.    PHYSICAL EXAM: Filed Vitals:   03/30/12 1312  BP: 117/81  Pulse: 91  Temp: 98.2 F (36.8 C)  Resp: 18   Filed Vitals:   03/30/12 1312  Height: 5\' 6"  (1.676 m)  Weight: 146 lb (66.225 kg)   Body mass index is 23.56 kg/(m^2).  General: Alert, no acute distress HEENT:  Normocephalic, atraumatic, oropharynx patent. EOMI, PERRLA, no thyroidmegaly Cardiovascular:  Regular rate and rhythm, no rubs murmurs or gallops.  No Carotid bruits, radial pulse intact. No pedal edema.  Respiratory: Clear to auscultation bilaterally.  No wheezes, rales, or rhonchi.  No cyanosis, no use of accessory musculature GI: No organomegaly, abdomen is soft and non-tender, positive bowel sounds.  No masses. Skin: No rashes. Neurologic: Facial musculature symmetric. Psychiatric: Patient is appropriate throughout our interaction.  Lymphatic: No cervical lymphadenopathy Musculoskeletal: Gait intact. No CVA tenderness   LABS: Results for orders placed in visit on 03/30/12  POCT UA - MICROSCOPIC ONLY      Component Value Range   WBC, Ur, HPF, POC 25-50     RBC, urine, microscopic 0-1     Bacteria, U Microscopic TRACE     Mucus, UA NEG     Epithelial cells, urine per micros 2-5     Crystals, Ur, HPF, POC NEG     Casts, Ur, LPF, POC NEG     Yeast, UA NEG    POCT URINALYSIS DIPSTICK      Component Value Range   Color, UA YELLOW     Clarity, UA CLEAR     Glucose, UA NEG       Bilirubin, UA NEG     Ketones, UA NEG     Spec Grav, UA >=1.030     Blood, UA SMALL     pH, UA 5.5     Protein, UA 300     Urobilinogen, UA 0.2     Nitrite, UA NEG     Leukocytes, UA Negative       EKG/XRAY:   Primary read interpreted by Ana Best at [facility].   ASSESSMENT/PLAN: Encounter Diagnoses  Name Primary?  . Bladder infection Yes  . Bipolar 1 disorder   . Birth control   . Anxiety and depression    1. UA was negative, urine culture pending. Asked patient to push fluids, to take AZO OTC and cranberry juice until culture returns.  2. Refill on Klonazepam x 2 months only ( she recently got a rx refill on 03/24/12, I gave her another rx for refills to  be picked up on 04/24/12 and 05/24/2012) , need f/u with [facility] for more refills. No exceptions, patient understands this.  3. Refill birth control x 3 months until pap, will not give more since overdue for annual ( patient does not want pap/pelvic today) 4. Pending labs since on abilify, lamictal: TSH, CMP 5. TB screening  F/u in 48-72 hours for PPD reading   Ana Mcdonnell PHUONG, DO 03/30/2012 2:40 PM

## 2012-04-01 LAB — URINE CULTURE
Colony Count: NO GROWTH
Organism ID, Bacteria: NO GROWTH

## 2012-04-02 ENCOUNTER — Ambulatory Visit (INDEPENDENT_AMBULATORY_CARE_PROVIDER_SITE_OTHER): Payer: PRIVATE HEALTH INSURANCE

## 2012-04-02 DIAGNOSIS — Z111 Encounter for screening for respiratory tuberculosis: Secondary | ICD-10-CM

## 2012-04-15 LAB — TB SKIN TEST
Induration: 0 mm
TB Skin Test: NEGATIVE

## 2012-06-13 ENCOUNTER — Encounter: Payer: PRIVATE HEALTH INSURANCE | Admitting: Physician Assistant

## 2012-06-13 ENCOUNTER — Ambulatory Visit (INDEPENDENT_AMBULATORY_CARE_PROVIDER_SITE_OTHER): Payer: PRIVATE HEALTH INSURANCE | Admitting: Internal Medicine

## 2012-06-13 ENCOUNTER — Encounter: Payer: Self-pay | Admitting: Internal Medicine

## 2012-06-13 ENCOUNTER — Ambulatory Visit: Payer: PRIVATE HEALTH INSURANCE | Admitting: Internal Medicine

## 2012-06-13 VITALS — BP 110/68 | HR 95 | Temp 98.2°F | Resp 16 | Ht 66.0 in | Wt 152.4 lb

## 2012-06-13 DIAGNOSIS — F988 Other specified behavioral and emotional disorders with onset usually occurring in childhood and adolescence: Secondary | ICD-10-CM

## 2012-06-13 DIAGNOSIS — F419 Anxiety disorder, unspecified: Secondary | ICD-10-CM

## 2012-06-13 DIAGNOSIS — Z01419 Encounter for gynecological examination (general) (routine) without abnormal findings: Secondary | ICD-10-CM

## 2012-06-13 DIAGNOSIS — F39 Unspecified mood [affective] disorder: Secondary | ICD-10-CM

## 2012-06-13 DIAGNOSIS — IMO0001 Reserved for inherently not codable concepts without codable children: Secondary | ICD-10-CM

## 2012-06-13 DIAGNOSIS — F341 Dysthymic disorder: Secondary | ICD-10-CM

## 2012-06-13 MED ORDER — CLONAZEPAM 2 MG PO TABS: 2.0000 mg | ORAL_TABLET | Freq: Three times a day (TID) | ORAL | Status: DC | PRN

## 2012-06-13 MED ORDER — AMPHETAMINE-DEXTROAMPHETAMINE 10 MG PO TABS: 10.0000 mg | ORAL_TABLET | Freq: Two times a day (BID) | ORAL | Status: DC

## 2012-06-13 MED ORDER — NORGESTIMATE-ETH ESTRADIOL 0.25-35 MG-MCG PO TABS: 1.0000 | ORAL_TABLET | Freq: Every day | ORAL | Status: DC

## 2012-06-13 NOTE — Progress Notes (Deleted)
Mom didn't Brother on med age 26 PhD material sci
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
## 2012-06-13 NOTE — Progress Notes (Signed)
  Subjective:    Patient ID: Ana Best, female    DOB: 1986-05-05, 26 y.o.   MRN: [id number]  HPIhere to see Dr. Merla Riches but wants me to do her pap smear.  Denies any problems or high risk sexual activity.   Review of Systems Not done by me    Objective:   Physical Exam  Genitourinary: Vagina normal and uterus normal. No vaginal discharge (there is scant blood in the os (stated her period 2 days ago).  wiped away with a large swab and took the papsmear) found.       Bimanual unremarkable.          Assessment & Plan:  Normal exam ineligible for med refills/all of this was added to the other clinic note by Dr. Merla Riches

## 2012-06-15 LAB — PAP IG, CT-NG, RFX HPV ASCU

## 2012-06-18 ENCOUNTER — Encounter: Payer: Self-pay | Admitting: Family Medicine

## 2012-07-11 ENCOUNTER — Ambulatory Visit (INDEPENDENT_AMBULATORY_CARE_PROVIDER_SITE_OTHER): Payer: BC Managed Care – PPO | Admitting: Internal Medicine

## 2012-07-11 VITALS — BP 120/74 | HR 91 | Temp 98.0°F | Resp 18 | Ht 67.0 in | Wt 156.0 lb

## 2012-07-11 DIAGNOSIS — R4581 Low self-esteem: Secondary | ICD-10-CM | POA: Insufficient documentation

## 2012-07-11 DIAGNOSIS — F39 Unspecified mood [affective] disorder: Secondary | ICD-10-CM | POA: Insufficient documentation

## 2012-07-11 DIAGNOSIS — F988 Other specified behavioral and emotional disorders with onset usually occurring in childhood and adolescence: Secondary | ICD-10-CM

## 2012-07-11 MED ORDER — AMPHETAMINE-DEXTROAMPHETAMINE 20 MG PO TABS: 20.0000 mg | ORAL_TABLET | Freq: Two times a day (BID) | ORAL | Status: DC

## 2012-07-11 NOTE — Progress Notes (Signed)
  Subjective:    Patient ID: Ana Best, female    DOB: 1985/12/30, 26 y.o.   MRN: [id number]  HPIDoing extremely well with Adderall/20 mg twice a day seems to work well without side effects and only a mild irritability during the wear off phase Sleeping well Need for Clonopin has greatly decreased this month Continues on medicines for mood disorder from Dr. Tomasa Rand would love to reduce those/started about 2-1/2 years ago Lots of depression and moodiness related to self-esteem problems stemming from her role as his stupid one in the family who could never get anything right Mother has noticed quite a difference since starting Adderall Boyfriend of 3 years and she lives with has also noticed intermittent is difference She has a much improved sense of self Things much improved at work as well    Review of Systems     Objective:   Physical Exam None needed       Assessment & Plan:   1. ADD (attention deficit disorder) - Meds ordered this encounter  Medications  . amphetamine-dextroamphetamine (ADDERALL) 20 MG tablet    Sig: Take 1 tablet (20 mg total) by mouth 2 (two) times daily.    Dispense:  60 tablet    Refill:  0  . amphetamine-dextroamphetamine (ADDERALL) 20 MG tablet    Sig: Take 1 tablet (20 mg total) by mouth 2 (two) times daily.    Dispense:  60 tablet    Refill:  0  . amphetamine-dextroamphetamine (ADDERALL) 20 MG tablet    Sig: Take 1 tablet (20 mg total) by mouth 2 (two) times daily.    Dispense:  60 tablet    Refill:  0   Discussed ADD and relationship/ADD in self-esteem/aDD and  depression  2. Mood disorder -To see Dr. Tomasa Rand 08/18/2012-I question how much of her mood disorder was related to her sense of self-esteem in the face of everything that [id number] On prior to her diagnosis of ADD  3. Poor self esteem -Discussed the longer term implications of needing family approval and its effect on self-esteem

## 2012-09-01 ENCOUNTER — Ambulatory Visit (INDEPENDENT_AMBULATORY_CARE_PROVIDER_SITE_OTHER): Payer: BC Managed Care – PPO | Admitting: Emergency Medicine

## 2012-09-01 VITALS — BP 110/62 | HR 104 | Temp 98.1°F | Resp 16 | Ht 66.5 in | Wt 155.2 lb

## 2012-09-01 DIAGNOSIS — L0231 Cutaneous abscess of buttock: Secondary | ICD-10-CM

## 2012-09-01 MED ORDER — HYDROCODONE-ACETAMINOPHEN 5-325 MG PO TABS: 1.0000 | ORAL_TABLET | ORAL | Status: AC | PRN

## 2012-09-01 MED ORDER — SULFAMETHOXAZOLE-TRIMETHOPRIM 800-160 MG PO TABS: 2.0000 | ORAL_TABLET | Freq: Two times a day (BID) | ORAL | Status: DC

## 2012-09-01 MED ORDER — CIPROFLOXACIN HCL 500 MG PO TABS: 500.0000 mg | ORAL_TABLET | Freq: Two times a day (BID) | ORAL | Status: DC

## 2012-09-01 NOTE — Progress Notes (Signed)
[facility] [address] [place] [address] [id number] [phone]  Date:  09/01/2012   Name:  Ana Best   DOB:  24-Aug-1986   MRN:  [id number]  PCP:  No primary provider on file.    Chief Complaint: Abscess, Chills and Nausea   History of Present Illness:  Ana Best is a 26 y.o. very pleasant female patient who presents with the following:  1 week duration abscess on right buttock that is worsening with time.  Has pain and increasing erythema.  No improvement with home remedies.  No fever or chills  Patient Active Problem List  Diagnosis  . Cellulitis of labia majora  . ADD (attention deficit disorder)  . Mood disorder  . Poor self esteem    Past Medical History  Diagnosis Date  . Anxiety   . Depression   . Bipolar 1 disorder     Past Surgical History  Procedure Date  . Tonsillectomy   . Dilation and curettage of uterus   . Wisdom tooth extraction     History  Substance Use Topics  . Smoking status: Current Every Day Smoker -- 0.5 packs/day for 10 years    Types: Cigarettes  . Smokeless tobacco: Never Used  . Alcohol Use: Yes    No family history on file.  No Known Allergies  Medication list has been reviewed and updated.  Current Outpatient Prescriptions on File Prior to Visit  Medication Sig Dispense Refill  . amphetamine-dextroamphetamine (ADDERALL) 20 MG tablet Take 1 tablet (20 mg total) by mouth 2 (two) times daily.  60 tablet  0  . ARIPiprazole (ABILIFY) 10 MG tablet Take 15 mg by mouth daily.       . clonazePAM (KLONOPIN) 2 MG tablet Take 1 tablet (2 mg total) by mouth 3 (three) times daily as needed for anxiety. DO NOT FILL UNTIL 04/24/2012, 05/24/2012  90 tablet  5  . lamoTRIgine (LAMICTAL) 150 MG tablet Take 150 mg by mouth daily.        . norgestimate-ethinyl estradiol (ORTHO-CYCLEN,SPRINTEC,PREVIFEM) 0.25-35 MG-MCG tablet Take 1 tablet by mouth daily.  3 Package  3  . amphetamine-dextroamphetamine (ADDERALL) 20 MG  tablet Take 1 tablet (20 mg total) by mouth 2 (two) times daily.  60 tablet  0  . amphetamine-dextroamphetamine (ADDERALL) 20 MG tablet Take 1 tablet (20 mg total) by mouth 2 (two) times daily.  60 tablet  0    Review of Systems:  As per HPI, otherwise negative.    Physical Examination: Filed Vitals:   09/01/12 1409  BP: 110/62  Pulse: 104  Temp: 98.1 F (36.7 C)  Resp: 16   Filed Vitals:   09/01/12 1409  Height: 5' 6.5" (1.689 m)  Weight: 155 lb 3.2 oz (70.398 kg)   Body mass index is 24.67 kg/(m^2). Ideal Body Weight: Weight in (lb) to have BMI = 25: 156.9    GEN: WDWN, NAD, Non-toxic, Alert & Oriented x 3 HEENT: Atraumatic, Normocephalic.  Ears and Nose: No external deformity. EXTR: No clubbing/cyanosis/edema NEURO: Normal gait.  PSYCH: Normally interactive. Conversant. Not depressed or anxious appearing.  Calm demeanor.  Right buttock large indurated area with medial pustule  Assessment and Plan: Unroofed pustule with small amount drainage Septra for MRSA Cipro for enterics Local heat follow up Monday  Carmelina Dane, MD  I have reviewed and agree with documentation. Robert P. Merla Riches, M.D.

## 2012-09-03 ENCOUNTER — Ambulatory Visit: Payer: BC Managed Care – PPO | Admitting: Emergency Medicine

## 2012-09-03 VITALS — BP 100/69 | HR 92 | Temp 97.8°F | Resp 18 | Ht 66.5 in | Wt 156.0 lb

## 2012-09-03 DIAGNOSIS — L0231 Cutaneous abscess of buttock: Secondary | ICD-10-CM

## 2012-09-03 NOTE — Progress Notes (Signed)
  Subjective:    Patient ID: Ana Best, female    DOB: 1986/02/03, 26 y.o.   MRN: [id number]  Wound Check She was originally treated 2 to 3 days ago. Previous treatment included I&D of abscess. Her temperature was unmeasured prior to arrival. There has been bloody and colored discharge from the wound. The redness has improved. The swelling has improved. The pain has improved.      Review of Systems  Constitutional: Negative.   HENT: Negative.   Genitourinary: Negative.   Musculoskeletal: Negative.        Objective:   Physical Exam  Constitutional: She appears well-developed and well-nourished.  Eyes: Conjunctivae normal are normal. Pupils are equal, round, and reactive to light. No scleral icterus.  Abdominal: Soft.  Musculoskeletal: Normal range of motion.  Skin: There is erythema.          Assessment & Plan:  Improving Continue antibiotics and warm soaks Follow up for increased pain and fever  I have reviewed and agree with documentation. Robert P. Merla Riches, M.D.

## 2012-09-26 ENCOUNTER — Ambulatory Visit: Payer: BC Managed Care – PPO | Admitting: Internal Medicine

## 2012-09-26 ENCOUNTER — Encounter: Payer: Self-pay | Admitting: Internal Medicine

## 2012-09-26 VITALS — BP 106/66 | HR 73 | Temp 98.4°F | Resp 16 | Ht 66.0 in | Wt 156.0 lb

## 2012-09-26 DIAGNOSIS — F988 Other specified behavioral and emotional disorders with onset usually occurring in childhood and adolescence: Secondary | ICD-10-CM

## 2012-09-26 DIAGNOSIS — Z23 Encounter for immunization: Secondary | ICD-10-CM

## 2012-09-26 MED ORDER — AMPHETAMINE-DEXTROAMPHETAMINE 20 MG PO TABS: 20.0000 mg | ORAL_TABLET | Freq: Two times a day (BID) | ORAL | Status: DC

## 2012-09-26 MED ORDER — AMPHETAMINE-DEXTROAMPHETAMINE 10 MG PO TABS: 10.0000 mg | ORAL_TABLET | Freq: Every day | ORAL | Status: DC

## 2012-09-26 NOTE — Progress Notes (Signed)
  Subjective:    Patient ID: Ana Best, female    DOB: [birth year], 26 y.o.   MRN: [id number]  CC: 26 yo W F here for f/u on ADD medicine.  HPI She says the medicine is working well, but sometimes she needs to take it 3x/day: 7:30 and 1pm regularly, and takes 10 mg again to help finish homework (occasionally) -- sometimes she has to pull an "all nighter"  She takes 20 mg now, and would like to add a 10 mg to her regimen prn.  She is having trouble juggling school and work. 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  She plans on graduating in December 2013, in just a few weeks.  She has several large projects she needs to wrap up and is having trouble passing [place].  She thinks the extra studying will help her graduate.    She is continuing to see Dr. Tomasa Rand and she thinks her moods are fairly stable.  Her other medications are working well.Lamictal and Abilify.  Pt received flu shot.   Review of Systems Sleeping well    Objective:   Physical Exam General: 26 yo F is pleasant and cooperative. Vitals:  Filed Vitals:   09/26/12 1255  BP: 106/66  Pulse: 73  Temp: 98.4 F (36.9 C)  Resp: 16  Neuro: Alert, oriented, CN II - XII grossly IT Psych: Mood and affect appropriate     Assessment & Plan:  1) ADD - Adderall 20 mg and 10 mg 2) Depression - Mood disorder-continue followup with Dr. Tomasa Rand 3) Note for school  Meds ordered this encounter  Medications  . amphetamine-dextroamphetamine (ADDERALL) 20 MG tablet    Sig: Take 1 tablet (20 mg total) by mouth 2 (two) times daily.    Dispense:  60 tablet    Refill:  0    For after 11/25/12  . amphetamine-dextroamphetamine (ADDERALL) 20 MG tablet    Sig: Take 1 tablet (20 mg total) by mouth 2 (two) times daily.    Dispense:  60 tablet    Refill:  0  . amphetamine-dextroamphetamine (ADDERALL) 20 MG tablet    Sig: Take 1 tablet (20 mg total) by mouth 2 (two) times daily. For after 10/26/12    Dispense:   60 tablet    Refill:  0  . amphetamine-dextroamphetamine (ADDERALL) 10 MG tablet    Sig: Take 1 tablet (10 mg total) by mouth daily. At 5pm if needed    Dispense:  60 tablet    Refill:  0   3 mos f/u

## 2012-12-26 ENCOUNTER — Ambulatory Visit: Payer: BC Managed Care – PPO | Admitting: Internal Medicine

## 2013-01-02 ENCOUNTER — Ambulatory Visit (INDEPENDENT_AMBULATORY_CARE_PROVIDER_SITE_OTHER): Payer: BC Managed Care – PPO | Admitting: Internal Medicine

## 2013-01-02 ENCOUNTER — Encounter: Payer: Self-pay | Admitting: Internal Medicine

## 2013-01-02 VITALS — BP 116/66 | HR 69 | Temp 98.1°F | Resp 16 | Ht 66.0 in | Wt 146.0 lb

## 2013-01-02 DIAGNOSIS — F988 Other specified behavioral and emotional disorders with onset usually occurring in childhood and adolescence: Secondary | ICD-10-CM

## 2013-01-02 DIAGNOSIS — F39 Unspecified mood [affective] disorder: Secondary | ICD-10-CM

## 2013-01-02 DIAGNOSIS — R590 Localized enlarged lymph nodes: Secondary | ICD-10-CM

## 2013-01-02 DIAGNOSIS — F341 Dysthymic disorder: Secondary | ICD-10-CM

## 2013-01-02 DIAGNOSIS — R4581 Low self-esteem: Secondary | ICD-10-CM

## 2013-01-02 DIAGNOSIS — F909 Attention-deficit hyperactivity disorder, unspecified type: Secondary | ICD-10-CM

## 2013-01-02 DIAGNOSIS — F17200 Nicotine dependence, unspecified, uncomplicated: Secondary | ICD-10-CM | POA: Insufficient documentation

## 2013-01-02 DIAGNOSIS — F329 Major depressive disorder, single episode, unspecified: Secondary | ICD-10-CM

## 2013-01-02 MED ORDER — AMPHETAMINE-DEXTROAMPHETAMINE 30 MG PO TABS: 30.0000 mg | ORAL_TABLET | Freq: Two times a day (BID) | ORAL | Status: DC

## 2013-01-02 MED ORDER — CLONAZEPAM 2 MG PO TABS: 2.0000 mg | ORAL_TABLET | Freq: Three times a day (TID) | ORAL | Status: DC | PRN

## 2013-01-02 NOTE — Progress Notes (Signed)
  Subjective:    Patient ID: Ana Best, female    DOB: 06-Jul-1986, 27 y.o.   MRN: [id number]  HPI here for followup for attention deficit disorder and anxiety related to poor self-esteem and mood disorder. 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 This started during a URI with cough which has resolved in the last day Nontender  Review of Systems Remainder of review of systems within normal limits   continues to smoke No heart or lung symptoms No headache or vision changes No depression Objective:   Physical Exam Vital signs stable HEENT clear except for 0.5 cm node in the postauricular area that is firm and nontender but very regular Mood excellent/affect appropriate/judgment intact       Assessment & Plan:  Problem #1 attention deficit disorder Adderall 30 mg #60 to use two thirds twice a day with one third late afternoon for a total of 50 mg a day Prescriptions given for 3 months/she may call in 3 months for another 3 and followup in 6 months  Problem #2 mood disorder Followed by Dr. Tomasa Rand with Lamictal and Abilify  Problem #3 anxiety insomnia related to self-esteem disorder Continue with Klonopin as needed and hopefully will be able to discontinue   Problem #4 nicotine addiction At her followup we will begin to work on cessation wants her life is more  stable  Problem #5 reactive postauricular node Recheck in 3-4 weeks if not totally resolved

## 2013-03-29 ENCOUNTER — Telehealth: Payer: Self-pay

## 2013-03-29 NOTE — Telephone Encounter (Signed)
Patient needs a refill on her adderall [phone]

## 2013-04-01 MED ORDER — AMPHETAMINE-DEXTROAMPHETAMINE 30 MG PO TABS: 30.0000 mg | ORAL_TABLET | Freq: Two times a day (BID) | ORAL | Status: DC

## 2013-04-01 NOTE — Telephone Encounter (Signed)
Meds ordered this encounter  Medications  . amphetamine-dextroamphetamine (ADDERALL) 30 MG tablet    Sig: Take 1 tablet (30 mg total) by mouth 2 (two) times daily. For 06/01/13    Dispense:  60 tablet    Refill:  0  . amphetamine-dextroamphetamine (ADDERALL) 30 MG tablet    Sig: Take 1 tablet (30 mg total) by mouth 2 (two) times daily. For 05/02/13    Dispense:  60 tablet    Refill:  0  . amphetamine-dextroamphetamine (ADDERALL) 30 MG tablet    Sig: Take 1 tablet (30 mg total) by mouth 2 (two) times daily.    Dispense:  60 tablet    Refill:  0

## 2013-04-01 NOTE — Telephone Encounter (Signed)
Left message on machine that rx was ready for pickup

## 2013-06-26 ENCOUNTER — Ambulatory Visit: Payer: BC Managed Care – PPO | Admitting: Internal Medicine

## 2013-06-27 ENCOUNTER — Telehealth: Payer: Self-pay
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
## 2013-06-28 NOTE — Telephone Encounter (Signed)
Please advise 

## 2013-07-01 ENCOUNTER — Telehealth: Payer: Self-pay

## 2013-07-01 NOTE — Telephone Encounter (Signed)
Patient advised she is due for follow up with Dr Merla Riches prior to Adderall refills he will be back in office on Aug 15th

## 2013-07-01 NOTE — Telephone Encounter (Signed)
Pt is returning our call 
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
## 2013-07-02 NOTE — Telephone Encounter (Signed)
Called again she needs appt with Dr Merla Riches for Adderall, she did not keep her last appt. She must be seen.

## 2013-07-03 ENCOUNTER — Ambulatory Visit: Payer: Self-pay | Admitting: Internal Medicine

## 2013-07-03 DIAGNOSIS — F329 Major depressive disorder, single episode, unspecified: Secondary | ICD-10-CM

## 2013-07-03 DIAGNOSIS — R350 Frequency of micturition: Secondary | ICD-10-CM

## 2013-07-03 DIAGNOSIS — F988 Other specified behavioral and emotional disorders with onset usually occurring in childhood and adolescence: Secondary | ICD-10-CM

## 2013-07-03 LAB — POCT URINALYSIS DIPSTICK
Nitrite, UA: POSITIVE
Protein, UA: 300
Urobilinogen, UA: 4
pH, UA: 5

## 2013-07-03 LAB — POCT UA - MICROSCOPIC ONLY
Casts, Ur, LPF, POC: NEGATIVE
Crystals, Ur, HPF, POC: NEGATIVE
Yeast, UA: NEGATIVE

## 2013-07-03 MED ORDER — CIPROFLOXACIN HCL 250 MG PO TABS: 250.0000 mg | ORAL_TABLET | Freq: Two times a day (BID) | ORAL | Status: DC

## 2013-07-03 MED ORDER — AMPHETAMINE-DEXTROAMPHETAMINE 30 MG PO TABS: 30.0000 mg | ORAL_TABLET | Freq: Two times a day (BID) | ORAL | Status: DC

## 2013-07-03 MED ORDER — CLONAZEPAM 2 MG PO TABS: 2.0000 mg | ORAL_TABLET | Freq: Three times a day (TID) | ORAL | Status: DC | PRN
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
## 2013-10-03 ENCOUNTER — Telehealth: Payer: Self-pay

## 2013-10-03 MED ORDER — AMPHETAMINE-DEXTROAMPHETAMINE 30 MG PO TABS: 30.0000 mg | ORAL_TABLET | Freq: Two times a day (BID) | ORAL | Status: DC

## 2013-10-03 NOTE — Telephone Encounter (Signed)
Patient says she is in need of a refill on her amphetamine-dextroamphetamine (ADDERALL) 30 MG tablet medication. She says she was seen in August and did the office visit as required before. She was hoping to get it picked up today because her medication runs out tomorrow. Please advise. Merla Riches will be off this week. Who is her doctor that refills her medication.  Patient says she does not have voicemail set up on her phone. She says she will call later to check status.     [phone]

## 2013-10-03 NOTE — Telephone Encounter (Signed)
Patient advised this is ready for pick up.  

## 2013-10-03 NOTE — Telephone Encounter (Signed)
Meds ordered this encounter  Medications  . amphetamine-dextroamphetamine (ADDERALL) 30 MG tablet    Sig: Take 1 tablet (30 mg total) by mouth 2 (two) times daily. 12/04/13    Dispense:  60 tablet    Refill:  0  . amphetamine-dextroamphetamine (ADDERALL) 30 MG tablet    Sig: Take 1 tablet (30 mg total) by mouth 2 (two) times daily. For 10/04/13    Dispense:  60 tablet    Refill:  0  . amphetamine-dextroamphetamine (ADDERALL) 30 MG tablet    Sig: Take 1 tablet (30 mg total) by mouth 2 (two) times daily. For12/14/14    Dispense:  60 tablet    Refill:  0

## 2013-10-03 NOTE — Telephone Encounter (Signed)
° °  [phone] °

## 2013-10-03 NOTE — Telephone Encounter (Signed)
I spoke w/pt and reviewed controlled subst policy. Advised her in future to allow several days for RF to make sure that Dr Merla Riches will be in to write RF. Pt voiced understanding. I advised her that I will send this message to Dr Merla Riches and that he normally checks his messages daily.
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
## 2013-12-31 ENCOUNTER — Telehealth: Payer: Self-pay

## 2013-12-31 NOTE — Telephone Encounter (Signed)
Patient needs a refill on her adderall please call [phone] when ready for pick up

## 2014-01-02 NOTE — Telephone Encounter (Signed)
It has been 6 months---I can see her sat or sun for refills

## 2014-01-02 NOTE — Telephone Encounter (Signed)
Patient notified and voiced understanding.

## 2014-01-04 ENCOUNTER — Ambulatory Visit (INDEPENDENT_AMBULATORY_CARE_PROVIDER_SITE_OTHER): Payer: BC Managed Care – PPO | Admitting: Internal Medicine

## 2014-01-04 VITALS — BP 122/70 | HR 75 | Temp 98.0°F | Resp 16 | Ht 66.5 in | Wt 159.0 lb

## 2014-01-04 DIAGNOSIS — F909 Attention-deficit hyperactivity disorder, unspecified type: Secondary | ICD-10-CM

## 2014-01-04 DIAGNOSIS — F988 Other specified behavioral and emotional disorders with onset usually occurring in childhood and adolescence: Secondary | ICD-10-CM

## 2014-01-04 DIAGNOSIS — F39 Unspecified mood [affective] disorder: Secondary | ICD-10-CM

## 2014-01-04 DIAGNOSIS — F419 Anxiety disorder, unspecified: Secondary | ICD-10-CM

## 2014-01-04 DIAGNOSIS — F329 Major depressive disorder, single episode, unspecified: Secondary | ICD-10-CM

## 2014-01-04 DIAGNOSIS — F411 Generalized anxiety disorder: Secondary | ICD-10-CM

## 2014-01-04 MED ORDER — AMPHETAMINE-DEXTROAMPHETAMINE 30 MG PO TABS: 30.0000 mg | ORAL_TABLET | Freq: Two times a day (BID) | ORAL | Status: DC

## 2014-01-04 MED ORDER — CLONAZEPAM 2 MG PO TABS: 2.0000 mg | ORAL_TABLET | Freq: Three times a day (TID) | ORAL | Status: DC | PRN
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
## 2014-02-04 ENCOUNTER — Telehealth: Payer: Self-pay

## 2014-02-04 NOTE — Telephone Encounter (Signed)
PA approved for Adderall through 02/04/15. Pharm notified.

## 2014-04-02 ENCOUNTER — Telehealth: Payer: Self-pay

## 2014-04-02 MED ORDER — AMPHETAMINE-DEXTROAMPHETAMINE 30 MG PO TABS: 30.0000 mg | ORAL_TABLET | Freq: Two times a day (BID) | ORAL | Status: DC

## 2014-04-02 NOTE — Telephone Encounter (Signed)
PT IN NEED OF HER ADDERALL 30MG S. PLEASE CALL [phone] WHEN READY FOR PICK UP

## 2014-04-02 NOTE — Telephone Encounter (Signed)
Done Meds ordered this encounter  Medications  . amphetamine-dextroamphetamine (ADDERALL) 30 MG tablet    Sig: Take 1 tablet (30 mg total) by mouth 2 (two) times daily.    Dispense:  60 tablet    Refill:  0  . amphetamine-dextroamphetamine (ADDERALL) 30 MG tablet    Sig: Take 1 tablet (30 mg total) by mouth 2 (two) times daily. Fill 30 days after day signed    Dispense:  60 tablet    Refill:  0  . amphetamine-dextroamphetamine (ADDERALL) 30 MG tablet    Sig: Take 1 tablet (30 mg total) by mouth 2 (two) times daily. fill 60 days after day signed    Dispense:  60 tablet    Refill:  0

## 2014-04-02 NOTE — Telephone Encounter (Signed)
Pt notified that this is up front for p/u 

## 2014-04-25 LAB — OB RESULTS CONSOLE ANTIBODY SCREEN: Antibody Screen: NEGATIVE

## 2014-04-25 LAB — OB RESULTS CONSOLE HIV ANTIBODY (ROUTINE TESTING): HIV: NONREACTIVE

## 2014-04-25 LAB — OB RESULTS CONSOLE RPR: RPR: NONREACTIVE

## 2014-04-25 LAB — OB RESULTS CONSOLE GC/CHLAMYDIA
Chlamydia: NEGATIVE
GC PROBE AMP, GENITAL: NEGATIVE

## 2014-04-25 LAB — OB RESULTS CONSOLE HEPATITIS B SURFACE ANTIGEN: Hepatitis B Surface Ag: NEGATIVE

## 2014-04-25 LAB — OB RESULTS CONSOLE RUBELLA ANTIBODY, IGM: Rubella: IMMUNE

## 2014-04-25 LAB — OB RESULTS CONSOLE ABO/RH: RH TYPE: POSITIVE

## 2014-05-31 ENCOUNTER — Emergency Department (HOSPITAL_COMMUNITY)
Admission: EM | Admit: 2014-05-31 | Discharge: 2014-06-02 | Disposition: A | Discharge status: Home / Self Care | Payer: BC Managed Care – PPO | Attending: Emergency Medicine | Admitting: Emergency Medicine

## 2014-05-31 ENCOUNTER — Encounter (HOSPITAL_COMMUNITY): Payer: Self-pay | Admitting: Emergency Medicine

## 2014-05-31 DIAGNOSIS — F3289 Other specified depressive episodes: Secondary | ICD-10-CM | POA: Insufficient documentation

## 2014-05-31 DIAGNOSIS — F329 Major depressive disorder, single episode, unspecified: Secondary | ICD-10-CM | POA: Diagnosis not present

## 2014-05-31 DIAGNOSIS — F411 Generalized anxiety disorder: Secondary | ICD-10-CM | POA: Insufficient documentation

## 2014-05-31 DIAGNOSIS — R45851 Suicidal ideations: Secondary | ICD-10-CM | POA: Insufficient documentation

## 2014-05-31 DIAGNOSIS — Z79899 Other long term (current) drug therapy: Secondary | ICD-10-CM | POA: Diagnosis not present

## 2014-05-31 DIAGNOSIS — R454 Irritability and anger: Secondary | ICD-10-CM | POA: Diagnosis present

## 2014-05-31 DIAGNOSIS — O26899 Other specified pregnancy related conditions, unspecified trimester: Secondary | ICD-10-CM | POA: Diagnosis not present

## 2014-05-31 DIAGNOSIS — G479 Sleep disorder, unspecified: Secondary | ICD-10-CM | POA: Diagnosis not present

## 2014-05-31 DIAGNOSIS — F17200 Nicotine dependence, unspecified, uncomplicated: Secondary | ICD-10-CM | POA: Diagnosis not present

## 2014-05-31 DIAGNOSIS — F39 Unspecified mood [affective] disorder: Secondary | ICD-10-CM | POA: Diagnosis present

## 2014-05-31 DIAGNOSIS — F3175 Bipolar disorder, in partial remission, most recent episode depressed: Secondary | ICD-10-CM

## 2014-05-31 DIAGNOSIS — F319 Bipolar disorder, unspecified: Secondary | ICD-10-CM | POA: Diagnosis not present

## 2014-05-31 LAB — URINALYSIS, ROUTINE W REFLEX MICROSCOPIC
BILIRUBIN URINE: NEGATIVE
GLUCOSE, UA: NEGATIVE mg/dL
HGB URINE DIPSTICK: NEGATIVE
Ketones, ur: NEGATIVE mg/dL
Nitrite: NEGATIVE
PH: 5.5 (ref 5.0–8.0)
Protein, ur: NEGATIVE mg/dL
SPECIFIC GRAVITY, URINE: 1.019 (ref 1.005–1.030)
Urobilinogen, UA: 0.2 mg/dL (ref 0.0–1.0)

## 2014-05-31 LAB — COMPREHENSIVE METABOLIC PANEL
ALT: 10 U/L (ref 0–35)
AST: 13 U/L (ref 0–37)
Albumin: 2.9 g/dL — ABNORMAL LOW (ref 3.5–5.2)
Alkaline Phosphatase: 40 U/L (ref 39–117)
Anion gap: 13 (ref 5–15)
BUN: 5 mg/dL — ABNORMAL LOW (ref 6–23)
CALCIUM: 9.1 mg/dL (ref 8.4–10.5)
CO2: 18 meq/L — AB (ref 19–32)
Chloride: 106 mEq/L (ref 96–112)
Creatinine, Ser: 0.5 mg/dL (ref 0.50–1.10)
GFR calc Af Amer: >90 mL/min (ref >90)
GFR calc non Af Amer: >90 mL/min (ref >90)
Glucose, Bld: 120 mg/dL — ABNORMAL HIGH (ref 70–99)
POTASSIUM: 3.6 meq/L — AB (ref 3.7–5.3)
SODIUM: 137 meq/L (ref 137–147)
TOTAL PROTEIN: 6.6 g/dL (ref 6.0–8.3)
Total Bilirubin: 0.2 mg/dL — ABNORMAL LOW (ref 0.3–1.2)

## 2014-05-31 LAB — PREGNANCY, URINE: Preg Test, Ur: POSITIVE — AB

## 2014-05-31 LAB — URINE MICROSCOPIC-ADD ON

## 2014-05-31 LAB — CBC
HCT: 33.6 % — ABNORMAL LOW (ref 36.0–46.0)
Hemoglobin: 11.5 g/dL — ABNORMAL LOW (ref 12.0–15.0)
MCH: 30.1 pg (ref 26.0–34.0)
MCHC: 34.2 g/dL (ref 30.0–36.0)
MCV: 88 fL (ref 78.0–100.0)
PLATELETS: 270 10*3/uL (ref 150–400)
RBC: 3.82 MIL/uL — AB (ref 3.87–5.11)
RDW: 12.5 % (ref 11.5–15.5)
WBC: 12.4 10*3/uL — ABNORMAL HIGH (ref 4.0–10.5)

## 2014-05-31 LAB — ETHANOL: Alcohol, Ethyl (B): <11 mg/dL (ref 0–11)

## 2014-05-31 LAB — HCG, QUANTITATIVE, PREGNANCY: hCG, Beta Chain, Quant, S: 8334 m[IU]/mL — ABNORMAL HIGH (ref <5)

## 2014-05-31 LAB — SALICYLATE LEVEL: Salicylate Lvl: <2 mg/dL — ABNORMAL LOW (ref 2.8–20.0)

## 2014-05-31 LAB — RAPID URINE DRUG SCREEN, HOSP PERFORMED
AMPHETAMINES: POSITIVE — AB
BENZODIAZEPINES: POSITIVE — AB
Barbiturates: NOT DETECTED
Cocaine: NOT DETECTED
OPIATES: NOT DETECTED
Tetrahydrocannabinol: POSITIVE — AB

## 2014-05-31 LAB — ACETAMINOPHEN LEVEL: Acetaminophen (Tylenol), Serum: <15 ug/mL (ref 10–30)

## 2014-05-31 MED ORDER — NITROFURANTOIN MONOHYD MACRO 100 MG PO CAPS: 100.0000 mg | ORAL_CAPSULE | Freq: Once | ORAL | Status: DC

## 2014-05-31 MED ORDER — CLONAZEPAM 1 MG PO TABS
1.0000 mg | ORAL_TABLET | Freq: Three times a day (TID) | ORAL | Status: DC | PRN
  Administered 2014-05-31 – 2014-06-01 (×2): 1 mg via ORAL
  Filled 2014-05-31 (×3): qty 1

## 2014-05-31 MED ORDER — AMPHETAMINE-DEXTROAMPHETAMINE 10 MG PO TABS: 30.0000 mg | ORAL_TABLET | Freq: Two times a day (BID) | ORAL | Status: DC

## 2014-05-31 MED ORDER — ACETAMINOPHEN 325 MG PO TABS: 650.0000 mg | ORAL_TABLET | ORAL | Status: DC | PRN

## 2014-05-31 MED ORDER — LAMOTRIGINE 150 MG PO TABS
150.0000 mg | ORAL_TABLET | Freq: Every day | ORAL | Status: DC
  Administered 2014-05-31: 150 mg via ORAL
  Filled 2014-05-31 (×3): qty 1

## 2014-05-31 MED ORDER — NITROFURANTOIN MONOHYD MACRO 100 MG PO CAPS
100.0000 mg | ORAL_CAPSULE | Freq: Two times a day (BID) | ORAL | Status: DC
  Administered 2014-05-31 – 2014-06-02 (×4): 100 mg via ORAL
  Filled 2014-05-31 (×5): qty 1

## 2014-05-31 NOTE — BH Assessment (Signed)
Received call for assessment. Spoke to Ana Mallingavid Yao, MD who said Pt is [redacted] weeks pregnant and she has good outpatient support. Since Pt has been taken off medications she has been very depressed, angry and has made suicidal statements to mother. Tele-assessment will be initiated.  Harlin RainFord Ellis Ria CommentWarrick Jr, LPC, [facility] Triage Specialist [phone]

## 2014-05-31 NOTE — ED Provider Notes (Signed)
CSN: [id number]     Arrival date & time 05/31/14  1659 History   First MD Initiated Contact with Patient 05/31/14 1726     Chief Complaint  Patient presents with  . Suicidal      (Consider location/radiation/quality/duration/timing/severity/associated sxs/prior Treatment) The history is provided by the patient and a parent.  Ana Best is a 28 y.o. female hx of anxiety, depression, bipolar here with suicidal ideation. She is [redacted] weeks pregnant and has documented IUP in the office. She has been off of her meds since being pregnant and was only on lamictal, klonopin as per psychiatrist. For the last several weeks, as per mother, she has been more depressed and angry. Yesterday, she broke down and was screaming and punching things. She also told mother that she wants to drive off the cliff with her car. Denies hallucinations or homicidal ideations. Denies ingestions. Denies abdominal pain or vaginal bleeding.    Past Medical History  Diagnosis Date  . Anxiety   . Depression   . Bipolar 1 disorder    Past Surgical History  Procedure Laterality Date  . Tonsillectomy    . Dilation and curettage of uterus    . Wisdom tooth extraction     Family History  Problem Relation Age of Onset  . Depression Father   . Anxiety disorder Father   . ADD / ADHD Brother    History  Substance Use Topics  . Smoking status: Current Every Day Smoker -- 0.50 packs/day for 10 years    Types: Cigarettes  . Smokeless tobacco: Never Used  . Alcohol Use: Yes     Comment: drinks- 3-4 beers   OB History   Grav Para Term Preterm Abortions TAB SAB Ect Mult Living   3    2 2          Review of Systems  Psychiatric/Behavioral: Positive for suicidal ideas and sleep disturbance. The patient is nervous/anxious.   All other systems reviewed and are negative.     Allergies  Review of patient's allergies indicates no known allergies.  Home Medications   Prior to Admission medications   Medication  Sig Start Date End Date Taking? Authorizing Provider  amphetamine-dextroamphetamine (ADDERALL) 30 MG tablet Take 30 mg by mouth 2 (two) times daily.   Yes Historical Provider, MD  clonazePAM (KLONOPIN) 2 MG tablet Take 1 mg by mouth 3 (three) times daily as needed for anxiety.   Yes Historical Provider, MD  lamoTRIgine (LAMICTAL) 150 MG tablet Take 150 mg by mouth daily.     Yes Historical Provider, MD   BP 99/60  Pulse 79  Temp(Src) 97.5 F (36.4 C) (Oral)  Resp 14  SpO2 100%  LMP 12/22/2013 Physical Exam  Nursing note and vitals reviewed. Constitutional: She is oriented to person, place, and time.  Depressed   HENT:  Head: Normocephalic.  Mouth/Throat: Oropharynx is clear and moist.  Eyes: Conjunctivae are normal. Pupils are equal, round, and reactive to light.  Neck: Normal range of motion. Neck supple.  Cardiovascular: Normal rate, regular rhythm and normal heart sounds.   Pulmonary/Chest: Effort normal and breath sounds normal. No respiratory distress. She has no wheezes. She has no rales.  Abdominal: Soft. Bowel sounds are normal. She exhibits no distension. There is no tenderness. There is no rebound.  Small gravid uterus, nontender   Musculoskeletal: Normal range of motion. She exhibits no edema and no tenderness.  Neurological: She is alert and oriented to person, place, and time. No cranial  nerve deficit. Coordination normal.  Skin: Skin is warm and dry.  Psychiatric:  Depressed, suicidal     ED Course  Procedures (including critical care time) Labs Review Labs Reviewed  CBC - Abnormal; Notable for the following:    WBC 12.4 (*)    RBC 3.82 (*)    Hemoglobin 11.5 (*)    HCT 33.6 (*)    All other components within normal limits  COMPREHENSIVE METABOLIC PANEL - Abnormal; Notable for the following:    Potassium 3.6 (*)    CO2 18 (*)    Glucose, Bld 120 (*)    BUN 5 (*)    Albumin 2.9 (*)    Total Bilirubin 0.2 (*)    All other components within normal limits   SALICYLATE LEVEL - Abnormal; Notable for the following:    Salicylate Lvl <2.0 (*)    All other components within normal limits  URINE RAPID DRUG SCREEN (HOSP PERFORMED) - Abnormal; Notable for the following:    Benzodiazepines POSITIVE (*)    Amphetamines POSITIVE (*)    Tetrahydrocannabinol POSITIVE (*)    All other components within normal limits  HCG, QUANTITATIVE, PREGNANCY - Abnormal; Notable for the following:    hCG, Beta Chain, Quant, S 8334 (*)    All other components within normal limits  ACETAMINOPHEN LEVEL  ETHANOL  PREGNANCY, URINE  URINALYSIS, ROUTINE W REFLEX MICROSCOPIC    Imaging Review No results found.   EKG Interpretation None      MDM   Final diagnoses:  None   Ana Best is a 28 y.o. female here with suicidal ideations. Slightly tachy from not drinking much yesterday. Bicarb 18. Given food and will give oral fluids. HCG 8000, no signs of ectopic or miscarriage. Will transfer to psych ED for psych assessment.     Ana Best H Ana Sieg, MD 05/31/14 [phone]

## 2014-05-31 NOTE — BH Assessment (Signed)
Assessment complete. Ana Best, [facility], confirmed adult unit is currently at capacity. Gave clinical report to Dr. Oletta DarterSalina Agarwal who said Pt meets inpatient psychiatric criteria and recommends TTS contact facilities that specialize in mental health and pregnancy. [facility] when a bed becomes [facility]. Notified Dr. Chaney Mallingavid Yao and Ardelia MemsYolanda Miller, RN of recommendation.  Harlin RainFord Ellis Ria CommentWarrick Jr, LPC, [facility] Triage Specialist [phone]
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
## 2014-06-01 ENCOUNTER — Encounter (HOSPITAL_COMMUNITY): Payer: Self-pay | Admitting: Registered Nurse

## 2014-06-01 DIAGNOSIS — R45851 Suicidal ideations: Secondary | ICD-10-CM | POA: Diagnosis not present

## 2014-06-01 DIAGNOSIS — F4325 Adjustment disorder with mixed disturbance of emotions and conduct: Secondary | ICD-10-CM

## 2014-06-01 DIAGNOSIS — F319 Bipolar disorder, unspecified: Secondary | ICD-10-CM

## 2014-06-01 DIAGNOSIS — R454 Irritability and anger: Secondary | ICD-10-CM | POA: Diagnosis present

## 2014-06-01 NOTE — ED Notes (Signed)
Dr Tawni Carnessaranga and shuvon np into see

## 2014-06-01 NOTE — ED Notes (Signed)
Shuvon NP into see 

## 2014-06-01 NOTE — Progress Notes (Signed)
CSW was instructed to interview the patient's mother and develop potential option for the patient.  [name] reports that the patient is not currently safe to return home because of her pregnancy her medication were discontinued.  The patient is frequently going into rages which included threats to harm herself and others.  Mother reports that her psychiatrist instructed the patient to see the OBGYN for mental health medications and the OBGYN instructed her to see the psychiatrist.  She believes the patient is just not getting the help she needs therefore do not feel safe with her returning home.  She provided CSW with the patient's primary OBGYN contact information for assistance [facility] [phone], Dr. Steele BergPenn on-call [phone]).    CSW called [facility] spoke with Kathalene FramesShannan as she report there are no perinatal beds at this time.    Ana Best, MSW, Marlton[place]CSWA, 06/01/2014 Evening Clinical Social Worker [phone]

## 2014-06-01 NOTE — Progress Notes (Signed)
MHT contacted the following psychiatric facilities with bed availability:  1)[facility]-faxed referral 2)[facility]-no beds 3)[facility]-no beds 4)[facility]-no beds 5)[facility] 7)[facility]-no beds [facility]-no beds, call in AM 9)[facility]-declined due to pregnancy, no OB service 10)[facility]-declined due to pregnancy, no OB service 11)[facility]-declined due to pregnancy, no OB service 12)[facility]-faxed referral; under review 13)[facility]-faxed referral 14)[facility]-faxed referral  Blain PaisMichelle L Kaaren Nass, MHT/NS

## 2014-06-01 NOTE — ED Notes (Signed)
Dr Tawni CarnesSaranga and Denice BorsShuvon NP aware of mom's concerns about patients safety and of lamictal dosing.

## 2014-06-01 NOTE — ED Notes (Signed)
Pt's mom into see 

## 2014-06-01 NOTE — ED Notes (Signed)
Up to the bathroom 

## 2014-06-01 NOTE — ED Notes (Signed)
Dr saranga and shuvon into see 

## 2014-06-01 NOTE — ED Notes (Signed)
Mom's cell # [phone]
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
## 2014-06-01 NOTE — ED Notes (Addendum)
Mom at bedside, FHT mid lower abd 126 bpm.  Pt now reports that she does not want to keep the baby.  Mom is very concerned about the patient coming home and stated that "it is not an option" for her to come home.  Mom again states that she is afraid that the patient will hurt herself if she leaves.  Shuvon NP aware.

## 2014-06-01 NOTE — Consult Note (Signed)
Pt was interviewed with NP. Chart reviewed. Agree with above assessment and plan.  Kendrix Orman, MD 

## 2014-06-01 NOTE — ED Notes (Signed)
Shovon NP into talk w/ mom and patient

## 2014-06-01 NOTE — Progress Notes (Signed)
Attempted to Secure placement at the following facilities:  [facility]- Dannielle Huhanny- faxed information  [facility]- Jerilynn SomCalvin- will call back [facility]- Tia- can fax for tomorrow discharges, faxed information [facility]io BeringDavis- [facility]- [facility]- Dorathy [facility]aftKayla- [facility] beds [facility]- left message Constance [facility]- Patricia- no beds [facility]- Carollee [facility]annon- [facility] beds [facility]- [facility]lee [facility]- faxed information for possible discharges [facility]- Ama- no beds Nita SellsGaston- Olivia- no Adult beds, only child/adolescent Turner DanielsRowan- left message Jettie PaganStaley- Rebecca- no beds   Maryelizabeth Rowanressa Amely Voorheis, MSW, AmandaLCSWA, 06/01/2014 Evening Clinical Social Worker [phone]

## 2014-06-01 NOTE — ED Notes (Signed)
Dc lamictal [facility]OR[facility] dr Tawni Carnessaranga

## 2014-06-01 NOTE — Progress Notes (Signed)
Follow-up calls placed to the following: [place]- per Dorathy DaftKayla at capacity [facility] Alvia Grove[facility]rynn [facility]- per Wylene MenLacey at capacity  The following facilities have been contacted but are at capacity: [facility]- per Zadie Rhinealvin [facility]- per Troy SineKinley Brynn [facility]- per Pilar JarvisLacey [facility]- per Lenward Chancellorlivia [facility]- [facility]- per Reather LaurenceJoAnn [facility]- per Hetty BlendJill [facility]- [facility]- per Ellison CarwinLiz [place]- per Denny PeonErin not reviewing referral until Mon 7.13 [facility]- per Drema Pryanny Rowan- no answer  Pt [facility] declined [facility] the following facilities: [facility]   Maximino Cozzolino Disposition MHT

## 2014-06-01 NOTE — ED Notes (Signed)
Pt's mother into see 

## 2014-06-02 ENCOUNTER — Encounter (HOSPITAL_COMMUNITY): Payer: Self-pay | Admitting: Registered Nurse

## 2014-06-02 DIAGNOSIS — R45851 Suicidal ideations: Secondary | ICD-10-CM | POA: Diagnosis not present
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
## 2014-06-02 NOTE — Progress Notes (Signed)
  CARE MANAGEMENT ED NOTE 06/02/2014  Patient:  Adron BeneLAMPERT,Astra G   Account Number:  [id number]  Date Initiated:  06/02/2014  Documentation initiated by:  Edd ArbourGIBBS,Cia Garretson  Subjective/Objective Assessment:   28 yr old [employer] [place] pt hx of bipolar disorder and depression [redacted] week pregnant with increasling depression and angrer since being off meds No pcp     Subjective/Objective Assessment Detail:   pt states pcp is Dr Merla Richesoolittle     Action/Plan:   EPIC updated   Action/Plan Detail:   Anticipated DC Date:  06/02/2014     Status Recommendation to Physician:   Result of Recommendation:    Other ED Services  Consult Working Plan    DC Planning Services  Other  PCP issues  Outpatient Services - Pt will follow up    Choice offered to / List presented to:            Status of service:  Completed, signed off  ED Comments:   ED Comments Detail:

## 2014-06-02 NOTE — ED Notes (Signed)
Notified MD and Np that pt's mother would like to speak with MD as soon as possible.

## 2014-06-02 NOTE — ED Notes (Signed)
Pt's mother at bedside, requesting to speak with pt's social worker and also to the doctor ASAP, states that the patient "has been here for 3 days and nothing has been done, her needs are not being met, something needs to be done today! I want her to be moved to a place where her needs can be met because she is pregnant."

## 2014-06-02 NOTE — ED Notes (Signed)
Pt's mother was just in visiting with pt, spoke with LCSW and NP during visit, asked pt's mother to please step out to waiting room, pt's mother upset about this but does comply with request, MHT found food in pt room that was brought in from outside source, discarded in waste basket, explained to pt that food cannot be brought in from outside hospital, pt verbalized understanding.

## 2014-06-02 NOTE — [facility] Suicide Risk Assessment (Cosign Needed)
Suicide Risk Assessment  Discharge Assessment     Demographic Factors:  Caucasian and Female  Total Time spent with patient: 30 minutes Psychiatric Specialty Exam:      Blood pressure 105/71, pulse 71, temperature 98.1 F (36.7 C), temperature source Oral, resp. rate 20, last menstrual period 12/22/2013, SpO2 98.00%.There is no weight on file to calculate BMI.   General Appearance: Casual   Eye Contact:: Good   Speech: Clear and Coherent and Normal Rate   Volume: Normal   Mood: "I'm better"   Affect: Congruent   Thought Process: Circumstantial and Goal Directed   Orientation: Full (Time, Place, and Person)   Thought Content: Rumination   Suicidal Thoughts: No   Homicidal Thoughts: No   Memory: Immediate; Good  Recent; Good  Remote; Good   Judgement: Fair   Insight: Present   Psychomotor Activity: Normal   Concentration: Fair   Recall: Good   Fund of Knowledge:Good   Language: Good   Akathisia: No   Handed: Right   AIMS (if indicated):   Assets: Communication Skills   Sleep:   Musculoskeletal:  Strength & Muscle Tone: within normal limits  Gait & Station: normal  Patient leans: Right and N/A    Mental Status Per Nursing Assessment::   On Admission:     Current Mental Status by Physician: Patient denies suicidal/homicidal ideation, psychosis, and paranoia  Loss Factors: NA  Historical Factors: NA  Risk Reduction Factors:   Religious beliefs about death, Living with another person, especially a relative and Positive social support  Continued Clinical Symptoms:  Previous Psychiatric Diagnoses and Treatments  Cognitive Features That Contribute To Risk:  None noted    Suicide Risk:  Minimal: No identifiable suicidal ideation.  Patients presenting with no risk factors but with morbid ruminations; may be classified as minimal risk based on the severity of the depressive symptoms  Discharge Diagnoses:  AXIS I: Adjustment Disorder with Mixed Disturbance of  Emotions and Conduct and Bipolar Disorder  AXIS II: Deferred  AXIS III:  Past Medical History   Diagnosis  Date   .  Anxiety    .  Depression    .  Bipolar 1 disorder     AXIS IV: other psychosocial or environmental problems  AXIS V: 41-50 serious symptoms   Plan Of Care/Follow-up recommendations:  Activity:  Resume ususal activity Diet:  Resume usual diet  Is patient on multiple antipsychotic therapies at discharge:  No   Has Patient had three or more failed trials of antipsychotic monotherapy by history:  No  Recommended Plan for Multiple Antipsychotic Therapies: NA    Rankin, Shuvon, FNP-BC 06/02/2014, 1:59 PM

## 2014-06-02 NOTE — ED Notes (Signed)
Pt smiling and answering questions appropriately, denies SI/HI/AVH and anxiety.

## 2014-06-02 NOTE — Progress Notes (Addendum)
CSW spoke with patient and mother Ana Best who was at the bedside. Mother shared her concerns that her daughter is not receiving psychiatric medications or counseling at the hospital and stated that she would like her daughter transferred immediately to a psychiatric treatment facility for pregnant women. Mother concerned regarding patient's mood swings and anger. Patient has not been on medication recently and mother wishes for patient to start back on psychiatric medication. CSW updated ED ChiropodistAssistant Director and NP. CSW sent [facility] [phone] and Agustin Creearlene at [facility]. CSW also obtained consent from patient to contact her psychiatrist Dr. Tomasa Randunningham [phone]. NP asked to contact psychiatrist regarding medication recommendations.   CSW left message with office staff of Dr. Tomasa Randunningham. Awaiting call back.   Samuella BruinKristin Tray Best, MSW, [facility] Clinical Social Worker [facility] [phone]

## 2014-06-02 NOTE — Discharge Instructions (Signed)
Bipolar Disorder Bipolar disorder is a mental illness. The term bipolar disorder actually is used to describe a group of disorders that all share varying degrees of emotional highs and lows that can interfere with daily functioning, such as work, school, or relationships. Bipolar disorder also can lead to drug abuse, hospitalization, and suicide. The emotional highs of bipolar disorder are periods of elation or irritability and high energy. These highs can range from a mild form (hypomania) to a severe form (mania). People experiencing episodes of hypomania may appear energetic, excitable, and highly productive. People experiencing mania may behave impulsively or erratically. They often make poor decisions. They may have difficulty sleeping. The most severe episodes of mania can involve having very distorted beliefs or perceptions about the world and seeing or hearing things that are not real (psychotic delusions and hallucinations).  The emotional lows of bipolar disorder (depression) also can range from mild to severe. Severe episodes of bipolar depression can involve psychotic delusions and hallucinations. Sometimes people with bipolar disorder experience a state of mixed mood. Symptoms of hypomania or mania and depression are both present during this mixed-mood episode. SIGNS AND SYMPTOMS There are signs and symptoms of the episodes of hypomania and mania as well as the episodes of depression. The signs and symptoms of hypomania and mania are similar but vary in severity. They include:  Inflated self-esteem or feeling of increased self-confidence.  Decreased need for sleep.  Unusual talkativeness (rapid or pressured speech) or the feeling of a need to keep talking.  Sensation of racing thoughts or constant talking, with quick shifts between topics that may or may not be related (flight of ideas).  Decreased ability to focus or concentrate.  Increased purposeful activity, such as work, studies,  or social activity, or nonproductive activity, such as pacing, squirming and fidgeting, or finger and toe tapping.  Impulsive behavior and use of poor judgment, resulting in high-risk activities, such as having unprotected sex or spending excessive amounts of money. Signs and symptoms of depression include the following:   Feelings of sadness, hopelessness, or helplessness.  Frequent or uncontrollable episodes of crying.  Lack of feeling anything or caring about anything.  Difficulty sleeping or sleeping too much.  Inability to enjoy the things you used to enjoy.   Desire to be alone all the time.   Feelings of guilt or worthlessness.  Lack of energy or motivation.   Difficulty concentrating, remembering, or making decisions.  Change in appetite or weight beyond normal fluctuations.  Thoughts of death or the desire to harm yourself. DIAGNOSIS  Bipolar disorder is diagnosed through an assessment by your caregiver. Your caregiver will ask questions about your emotional episodes. There are two main types of bipolar disorder. People with type I bipolar disorder have manic episodes with or without depressive episodes. People with type II bipolar disorder have hypomanic episodes and major depressive episodes, which are more serious than mild depression. The type of bipolar disorder you have can make an important difference in how your illness is monitored and treated. Your caregiver may ask questions about your medical history and use of alcohol or drugs, including prescription medication. Certain medical conditions and substances also can cause emotional highs and lows that resemble bipolar disorder (secondary bipolar disorder).  TREATMENT  Bipolar disorder is a long-term illness. It is best controlled with continuous treatment rather than treatment only when symptoms occur. The following treatments can be prescribed for bipolar disorders:  Medication--Medication can be prescribed by  a doctor that  is an expert in treating mental disorders (psychiatrists). Medications called mood stabilizers are usually prescribed to help control the illness. Other medications are sometimes added if symptoms of mania, depression, or psychotic delusions and hallucinations occur despite the use of a mood stabilizer.  Talk therapy--Some forms of talk therapy are helpful in providing support, education, and guidance. A combination of medication and talk therapy is best for managing the disorder over time. A procedure in which electricity is applied to your brain through your scalp (electroconvulsive therapy) is used in cases of severe mania when medication and talk therapy do not work or work too slowly. Document Released: 02/13/2001 Document Revised: 03/04/2013 Document Reviewed: 12/03/2012 [facility] Patient Information 2015 [place], [place]. This information is not intended to replace advice given to you by your health care provider. Make sure you discuss any questions you have with your health care provider.  Stress and Stress Management Stress is a normal reaction to life events. It is what you feel when life demands more than you are used to or more than you can handle. Some stress can be useful. For example, the stress reaction can help you catch the last bus of the day, study for a test, or meet a deadline at work. But stress that occurs too often or for too long can cause problems. It can affect your emotional health and interfere with relationships and normal daily activities. Too much stress can weaken your immune system and increase your risk for physical illness. If you already have a medical problem, stress can make it worse. CAUSES  All sorts of life events may cause stress. An event that causes stress for one person may not be stressful for another person. Major life events commonly cause stress. These may be positive or negative. Examples include losing your job, moving into a new home,  getting married, having a baby, or losing a loved one. Less obvious life events may also cause stress, especially if they occur day after day or in combination. Examples include working long hours, driving in traffic, caring for children, being in debt, or being in a difficult relationship. SIGNS AND SYMPTOMS Stress may cause emotional symptoms including, the following:  Anxiety--This is feeling worried, afraid, on edge, overwhelmed, or out of control.  Anger--This is feeling irritated or impatient.  Depression--This is feeling sad, down, helpless, or guilty.  Difficulty focusing, remembering, or making decisions. Stress may cause physical symptoms, including the following:   Aches and pains--These may affect your head, neck, back, stomach, or other areas of your body.  Tight muscles or clenched jaw.  Low energy or trouble sleeping. Stress may cause unhealthy behaviors, including the following:   Eating to feel better (overeating) or skipping meals.  Sleeping too little, too much, or both.  Working too much or putting off tasks (procrastination).  Smoking, drinking alcohol, or using drugs to feel better. DIAGNOSIS  Stress is diagnosed through an assessment by your health care provider. Your health care provider will ask questions about your symptoms and any stressful life events.Your health care provider will also ask about your medical history and may order blood tests or other tests. Certain medical conditions and medicine can cause physical symptoms similar to stress. Mental illness can cause emotional symptoms and unhealthy behaviors similar to stress. Your health care provider may refer you to a mental health professional for further evaluation.  TREATMENT  Stress management is the recommended treatment for stress.The goals of stress management are reducing stressful life events and  coping with stress in healthy ways.  Techniques for reducing stressful life events include the  following:  Stress identification--Self-monitor for stress and identify what causes stress for you. These skills may help you to avoid some stressful events.  Time management--Set your priorities, keep a calendar of events, and learn to say "no." These tools can help you avoid making too many commitments. Techniques for coping with stress include the following:  Rethinking the problem--Try to think realistically about stressful events rather than ignoring them or overreacting. Try to find the positives in a stressful situation rather than focusing on the negatives.  Exercise--Physical exercise can release both physical and emotional tension. The key is to find a form of exercise you enjoy and do it regularly.  Relaxation techniques. These relax the body and mind. Examples include yoga, meditation, tai chi, biofeedback, deep breathing, progressive muscle relaxation, listening to music, being out in nature, journaling, and other hobbies. Again, the key is to find one or more that you enjoy and can do regularly.  Healthy lifestyle--Eat a balanced diet, get plenty of sleep, and do not smoke. Avoid using alcohol or drugs to relax.  Strong support network--Spend time with family, friends, or other people you enjoy being around.Express your feelings and talk things over with someone you trust. Counseling or talktherapy with a mental health professional may be helpful if you are having difficulty managing stress on your own. Medicine is typically not recommended for the treatment of stress.Talk to your health care provider if you think you need medicine for symptoms of stress. HOME CARE INSTRUCTIONS:  Keep all follow up appointments with your health care provider.  Only take any prescribed medicines as directed by your health care provider.  Talk to your health care provider before starting any new prescription or over-the-counter medicines. SEEK MEDICAL CARE IF:  Your symptoms get worse or  you start having new symptoms.  You feel overwhelmed by your problems and can no longer manage them on your own. SEEK IMMEDIATE MEDICAL CARE IF:  You feel like hurting yourself or someone else. Document Released: 05/03/2001 Document Revised: 11/12/2013 Document Reviewed: 07/02/2013 [facility] Patient Information 2015 [place], [place]. This information is not intended to replace advice given to you by your health care provider. Make sure you discuss any questions you have with your health care provider.

## 2014-06-02 NOTE — ED Notes (Signed)
Pt's mother [phone]

## 2014-06-02 NOTE — [facility] Counselor (Signed)
Shelly at [facility] - pt has been declined d/t having no beds. They aren't expecting any discharges today and don't keep referrals.  Fidencio Duddy Paige Dariella Gillihan, LCSWA Assessment Counselor 11:58   

## 2014-06-02 NOTE — Progress Notes (Signed)
Patient returning home with mother today. [facility], bed availability expected this week. Patient and mother thanked CSW for assistance, no questions at this time.   Samuella BruinKristin Demarr Kluever, MSW, [facility] Center[facility]WA Clinical Social Worker [facility] [phone]

## 2014-06-24 ENCOUNTER — Other Ambulatory Visit (HOSPITAL_COMMUNITY): Payer: Self-pay | Admitting: Obstetrics and Gynecology

## 2014-06-24 DIAGNOSIS — O289 Unspecified abnormal findings on antenatal screening of mother: Secondary | ICD-10-CM

## 2014-07-02 ENCOUNTER — Telehealth: Payer: Self-pay

## 2014-07-02 NOTE — Telephone Encounter (Signed)
See ED 7/11 Needs f/u before any refills

## 2014-07-02 NOTE — Telephone Encounter (Signed)
PT IN NEED OF HER ADDERALL 30MGS. PLEASE CALL [phone] WHEN READY FOR PICK UP °

## 2014-07-02 NOTE — Telephone Encounter (Signed)
Looks like pt is due for f/up. Do you want to give 1 more refill or have her RTC first?

## 2014-07-03 NOTE — Telephone Encounter (Signed)
Patient advised. She is provided his hours and will try to come in tomorrow before 3:30

## 2014-07-04 ENCOUNTER — Ambulatory Visit (HOSPITAL_COMMUNITY): Payer: BC Managed Care – PPO

## 2014-07-04 ENCOUNTER — Ambulatory Visit (INDEPENDENT_AMBULATORY_CARE_PROVIDER_SITE_OTHER): Payer: BC Managed Care – PPO | Admitting: Internal Medicine

## 2014-07-04 VITALS — BP 122/64 | HR 91 | Temp 97.8°F | Resp 16 | Ht 67.0 in | Wt 191.0 lb

## 2014-07-04 DIAGNOSIS — F988 Other specified behavioral and emotional disorders with onset usually occurring in childhood and adolescence: Secondary | ICD-10-CM

## 2014-07-04 DIAGNOSIS — F39 Unspecified mood [affective] disorder: Secondary | ICD-10-CM

## 2014-07-04 MED ORDER — AMPHETAMINE-DEXTROAMPHETAMINE 30 MG PO TABS: 30.0000 mg | ORAL_TABLET | Freq: Two times a day (BID) | ORAL | Status: DC

## 2014-07-04 MED ORDER — ARIPIPRAZOLE 5 MG PO TABS: 5.0000 mg | ORAL_TABLET | Freq: Every day | ORAL | Status: DC

## 2014-07-04 MED ORDER — LAMOTRIGINE 25 MG PO TABS: 25.0000 mg | ORAL_TABLET | Freq: Every day | ORAL | Status: DC

## 2014-07-04 NOTE — Progress Notes (Signed)
Subjective:  This chart was scribed for Ellamae Sia, MD by Andrew Au, ED Scribe. This patient was seen in room 8 and the patient's care was started at 10:34 AM.   Patient ID: Ana Best, female    DOB: [birth year], 28 y.o.   MRN: [id number]  HPI Chief Complaint  Patient presents with  . Medication Refill    dr Merla Riches     HPI Comments: Ana Best is a 28 y.o. female who presents to the [facility] for a medication refill.  Since her last February visit, she has had an episode of decompensation of her control of bipolar disorder which resulted in a hospitalization. She had been taken off of all of her medications and she feels like this was a miscommunication between her treating physicians. Pt states she has been frustrated due to constant medication changes between GYN and psychiatrist. After her acute hospitalization she was transferred to the neonatal psychiatric unit at [facility] restarted Lamictal at a low dose and added Abilify at a low dose. 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  Pt has been on adderall 2 doses a day.  Pt takes 1x 25 mg of lamictal and 5 mg of abilify.     Past Medical History  Diagnosis Date  . Anxiety   . Depression   . Bipolar 1 disorder    Past Surgical History  Procedure Laterality Date  . Tonsillectomy    . Dilation and curettage of uterus    . Wisdom tooth extraction     Prior to Admission medications   Medication Sig Start Date End Date Taking? Authorizing Provider  amphetamine-dextroamphetamine (ADDERALL) 30 MG tablet Take 30 mg by mouth 2 (two) times daily.   Yes Historical Provider, MD  clonazePAM (KLONOPIN) 2 MG tablet Take 1 mg by mouth 3 (three) times daily as needed for anxiety.   Yes Historical Provider, MD  lamoTRIgine (LAMICTAL) 150 MG tablet Take 25 mg by mouth daily.    Yes  Historical Provider, MD    Review of Systems Noncontributory at this point  Objective:   Physical Exam  Nursing note and vitals reviewed. Constitutional: She is oriented to person, place, and time. She appears well-developed and well-nourished. No distress.  HENT:  Head: Normocephalic and atraumatic.  Eyes: Conjunctivae and EOM are normal. Pupils are equal, round, and reactive to light.  Neck: Neck supple.  Cardiovascular: Normal rate.   Pulmonary/Chest: Effort normal.  Musculoskeletal: Normal range of motion.  Neurological: She is alert and oriented to person, place, and time.  Skin: Skin is warm and dry.  Psychiatric: She has a normal mood and affect. Her behavior is normal. Judgment and thought content normal.     Assessment & Plan:  ADD (attention deficit disorder)  Mood disorder  We discussed the concerns regarding the use of amphetamines during pregnancy. She feels like the benefits outweigh the risks, as she does with her mood disorder medication. Her psychiatrist here agrees.  Her OB/GYN agrees also.Now that she is shifting her psychiatric care and OB care to [facility] I have asked her to discuss the use of this medication with them as well although [facility] did indicate that they had no problem with this when she was there for her psychiatric treatment  Meds ordered this encounter  Medications  . amphetamine-dextroamphetamine (ADDERALL) 30 MG tablet  Sig: Take 1 tablet (30 mg total) by mouth 2 (two) times daily.    Dispense:  60 tablet    Refill:  0  . amphetamine-dextroamphetamine (ADDERALL) 30 MG tablet    Sig: Take 1 tablet (30 mg total) by mouth 2 (two) times daily. For 60 days after signed    Dispense:  60 tablet    Refill:  0  . amphetamine-dextroamphetamine (ADDERALL) 30 MG tablet    Sig: Take 1 tablet (30 mg total) by mouth 2 (two) times daily. For 30 days after signed    Dispense:  60 tablet    Refill:  0   Lamictal [facility] Abilify [facility]  I personally performed the services described in this documentation, which was scribed in my presence. The recorded information has been reviewed and is accurate.

## 2014-08-01 ENCOUNTER — Encounter (HOSPITAL_COMMUNITY): Payer: Self-pay | Admitting: Emergency Medicine

## 2014-08-01 ENCOUNTER — Emergency Department (HOSPITAL_COMMUNITY)
Admission: EM | Admit: 2014-08-01 | Discharge: 2014-08-01 | Disposition: A | Discharge status: Home / Self Care | Payer: BC Managed Care – PPO | Attending: Emergency Medicine | Admitting: Emergency Medicine

## 2014-08-01 DIAGNOSIS — F411 Generalized anxiety disorder: Secondary | ICD-10-CM | POA: Insufficient documentation

## 2014-08-01 DIAGNOSIS — E860 Dehydration: Secondary | ICD-10-CM

## 2014-08-01 DIAGNOSIS — Z3490 Encounter for supervision of normal pregnancy, unspecified, unspecified trimester: Secondary | ICD-10-CM

## 2014-08-01 DIAGNOSIS — Z87891 Personal history of nicotine dependence: Secondary | ICD-10-CM | POA: Diagnosis not present

## 2014-08-01 DIAGNOSIS — F319 Bipolar disorder, unspecified: Secondary | ICD-10-CM | POA: Insufficient documentation

## 2014-08-01 DIAGNOSIS — R55 Syncope and collapse: Secondary | ICD-10-CM | POA: Insufficient documentation

## 2014-08-01 DIAGNOSIS — Z79899 Other long term (current) drug therapy: Secondary | ICD-10-CM | POA: Diagnosis not present

## 2014-08-01 DIAGNOSIS — O211 Hyperemesis gravidarum with metabolic disturbance: Secondary | ICD-10-CM | POA: Insufficient documentation

## 2014-08-01 LAB — URINALYSIS, ROUTINE W REFLEX MICROSCOPIC
BILIRUBIN URINE: NEGATIVE
Glucose, UA: NEGATIVE mg/dL
HGB URINE DIPSTICK: NEGATIVE
KETONES UR: NEGATIVE mg/dL
Leukocytes, UA: NEGATIVE
Nitrite: NEGATIVE
PH: 6 (ref 5.0–8.0)
Protein, ur: NEGATIVE mg/dL
SPECIFIC GRAVITY, URINE: 1.024 (ref 1.005–1.030)
Urobilinogen, UA: 0.2 mg/dL (ref 0.0–1.0)

## 2014-08-01 LAB — TROPONIN I

## 2014-08-01 LAB — CBC WITH DIFFERENTIAL/PLATELET
Basophils Absolute: 0 10*3/uL (ref 0.0–0.1)
Basophils Relative: 0 % (ref 0–1)
EOS ABS: 0.1 10*3/uL (ref 0.0–0.7)
Eosinophils Relative: 1 % (ref 0–5)
HCT: 35.9 % — ABNORMAL LOW (ref 36.0–46.0)
HEMOGLOBIN: 12 g/dL (ref 12.0–15.0)
LYMPHS ABS: 1.4 10*3/uL (ref 0.7–4.0)
Lymphocytes Relative: 11 % — ABNORMAL LOW (ref 12–46)
MCH: 29.9 pg (ref 26.0–34.0)
MCHC: 33.4 g/dL (ref 30.0–36.0)
MCV: 89.3 fL (ref 78.0–100.0)
MONO ABS: 1.2 10*3/uL — AB (ref 0.1–1.0)
MONOS PCT: 10 % (ref 3–12)
Neutro Abs: 9.7 10*3/uL — ABNORMAL HIGH (ref 1.7–7.7)
Neutrophils Relative %: 78 % — ABNORMAL HIGH (ref 43–77)
Platelets: 235 10*3/uL (ref 150–400)
RBC: 4.02 MIL/uL (ref 3.87–5.11)
RDW: 13.6 % (ref 11.5–15.5)
WBC: 12.4 10*3/uL — ABNORMAL HIGH (ref 4.0–10.5)

## 2014-08-01 LAB — COMPREHENSIVE METABOLIC PANEL
ALBUMIN: 2.8 g/dL — AB (ref 3.5–5.2)
ALK PHOS: 57 U/L (ref 39–117)
ALT: 47 U/L — ABNORMAL HIGH (ref 0–35)
AST: 34 U/L (ref 0–37)
Anion gap: 15 (ref 5–15)
BUN: 9 mg/dL (ref 6–23)
CO2: 19 mEq/L (ref 19–32)
CREATININE: 0.49 mg/dL — AB (ref 0.50–1.10)
Calcium: 9.2 mg/dL (ref 8.4–10.5)
Chloride: 97 mEq/L (ref 96–112)
GFR calc non Af Amer: >90 mL/min (ref >90)
GLUCOSE: 93 mg/dL (ref 70–99)
POTASSIUM: 4 meq/L (ref 3.7–5.3)
Sodium: 131 mEq/L — ABNORMAL LOW (ref 137–147)
TOTAL PROTEIN: 6.8 g/dL (ref 6.0–8.3)
Total Bilirubin: 0.4 mg/dL (ref 0.3–1.2)

## 2014-08-01 LAB — HCG, QUANTITATIVE, PREGNANCY: HCG, BETA CHAIN, QUANT, S: 8144 m[IU]/mL — AB (ref <5)

## 2014-08-01 LAB — HCG, SERUM, QUALITATIVE: PREG SERUM: POSITIVE — AB

## 2014-08-01 LAB — CBG MONITORING, ED: Glucose-Capillary: 98 mg/dL (ref 70–99)

## 2014-08-01 MED ORDER — SODIUM CHLORIDE 0.9 % IV BOLUS (SEPSIS)
1000.0000 mL | Freq: Once | INTRAVENOUS | Status: AC
  Administered 2014-08-01: 1000 mL via INTRAVENOUS

## 2014-08-01 NOTE — Progress Notes (Signed)
Pt is G3P0 [redacted] week gestation who fainted in grocery store. States she's been having N/V for the past few days and did not eat breakfast today. Has had feelings of dizziness in past but this is the first time she fainted. Unsure how long she was out, does not have any reports of seizure activity. States she walked to her car, called her boyfriend to drive her to ER. At this time she feels hungry and has no dizzy feelings. FH via ext monitor 145bpm with spont accels, no decels. No uterine activity, no ROM or vag discharge.

## 2014-08-01 NOTE — ED Provider Notes (Signed)
CSN: [id number]     Arrival date & time 08/01/14  4098 History   First MD Initiated Contact with Patient 08/01/14 [phone]     Chief Complaint  Patient presents with  . Loss of Consciousness  . Emesis During Pregnancy     (Consider location/radiation/quality/duration/timing/severity/associated sxs/prior Treatment) The history is provided by the patient. No language interpreter was used.  Ana Best is a 28 year old female with past medical history of anxiety, depression, bipolar disorder, 26-[redacted] weeks pregnant presenting to the ED with 2 syncopal episodes that pain. Patient reported that she was getting a drink at the store, reported that she had a sudden onset of feeling "weird" and woke up on the ground. Stated that she felt tingly and dizzy at the same time. Patient reported that she did hit her head, stated that she woke up on the floor landing on her back. Patient is unsure if she had her stomach on the way down. Stated that when she got up she felt dizzy. Reported that she almost syncopized again when she was getting into the car. Stated that her boyfriend you to catch her. Reported that over the past 2 days she has not been eating-reported 2 episodes of emesis yesterday and the day before she had emesis and locked. Stated that she's been having this stomach issue where she feels as if his stretching. Stated that she was seen and assessed by her OB/GYN Dr. Henderson Cloud on 07/22/2014 with a good exam. Patient states she's feeling much better now. Denied fever, headache, vomiting, blurred vision, sudden loss of vision, neck pain, neck stiffness, nausea, abdominal cramping, vaginal bleeding, vaginal discharge. PCP Dr. Merla Riches  Past Medical History  Diagnosis Date  . Anxiety   . Depression   . Bipolar 1 disorder    Past Surgical History  Procedure Laterality Date  . Tonsillectomy    . Dilation and curettage of uterus    . Wisdom tooth extraction     Family History  Problem Relation Age of  Onset  . Depression Father   . Anxiety disorder Father   . ADD / ADHD Brother    History  Substance Use Topics  . Smoking status: Former Smoker -- 0.50 packs/day for 10 years    Types: Cigarettes  . Smokeless tobacco: Never Used  . Alcohol Use: Yes     Comment: drinks- 3-4 beers   OB History   Grav Para Term Preterm Abortions TAB SAB Ect Mult Living   Review of Systems  Constitutional: Negative for fever and chills.  Eyes: Negative for visual disturbance.  Respiratory: Negative for chest tightness and shortness of breath.   Cardiovascular: Negative for chest pain.  Gastrointestinal: Negative for nausea, vomiting, abdominal pain and diarrhea.  Genitourinary: Negative for vaginal bleeding and vaginal discharge.  Musculoskeletal: Negative for back pain and neck pain.  Neurological: Positive for syncope. Negative for dizziness, seizures and headaches.      Allergies  Review of patient's allergies indicates no known allergies.  Home Medications   Prior to Admission medications   Medication Sig Start Date End Date Taking? Authorizing Provider  amphetamine-dextroamphetamine (ADDERALL) 30 MG tablet Take 30 mg by mouth daily.   Yes Historical Provider, MD  ARIPiprazole (ABILIFY) 5 MG tablet Take 1 tablet (5 mg total) by mouth daily. 07/04/14  Yes Tonye Pearson, MD  folic acid (FOLVITE) 400 MCG tablet Take 400 mcg by mouth daily.  Yes Historical Provider, MD  lamoTRIgine (LAMICTAL) 25 MG tablet Take 1 tablet (25 mg total) by mouth daily. 07/04/14  Yes Tonye Pearson, MD  Prenatal Vit-Fe Fumarate-FA (PRENATAL MULTIVITAMIN) TABS tablet Take 1 tablet by mouth daily at 12 noon.   Yes Historical Provider, MD  traZODone (DESYREL) 50 MG tablet Take 50 mg by mouth at bedtime as needed for sleep.   Yes Historical Provider, MD   BP 109/70  Pulse 102  Temp(Src) 98.7 F (37.1 C) (Oral)  Resp 16  SpO2 99%  LMP 12/22/2013 Physical Exam  Nursing note and vitals  reviewed. Constitutional: She is oriented to person, place, and time. She appears well-developed and well-nourished. No distress.  HENT:  Head: Normocephalic and atraumatic. Not macrocephalic and not microcephalic. Head is without raccoon's eyes, without Battle's sign, without abrasion and without contusion.  Mouth/Throat: Oropharynx is clear and moist. No oropharyngeal exudate.  Negative palpation of hematomas Negative palpation of crepitus or depressions of the skull Negative signs of facial trauma-negative lacerations or abrasions noted   Eyes: Conjunctivae and EOM are normal. Pupils are equal, round, and reactive to light. Left eye exhibits no discharge.  Visual fields grossly intact Horizontal nystagmus identified  Neck: Normal range of motion. Neck supple. No tracheal deviation present.  Negative neck stiffness Negative nuchal rigidity Negative cervical lymphadenopathy Negative pain upon palpation to the C-spine  Cardiovascular: Normal rate, regular rhythm and normal heart sounds.  Exam reveals no friction rub.   No murmur heard. Pulmonary/Chest: Effort normal and breath sounds normal. No respiratory distress. She has no wheezes. She has no rales.  Musculoskeletal: Normal range of motion.  Full ROM to upper and lower extremities without difficulty noted, negative ataxia noted.  Lymphadenopathy:    She has no cervical adenopathy.  Neurological: She is alert and oriented to person, place, and time. No cranial nerve deficit. She exhibits normal muscle tone. Coordination normal.  Cranial nerves III-XII grossly intact Strength 5+/5+ to upper and lower extremities bilaterally with resistance applied, equal distribution noted Equal grip strength bilaterally Negative facial droop Negative slurred speech Negative aphasia  Skin: Skin is warm and dry. No rash noted. She is not diaphoretic. No erythema.  Psychiatric: She has a normal mood and affect. Her behavior is normal. Thought content  normal.    ED Course  Procedures (including critical care time)  8:12 AM fetal heart tones nurse at bedside monitoring. Fetal heart tones sounded, baby moving.  8:41 AM This provider spoke with OB/GYN nurse. OB/GYN nurse spoke with Dr. Tenny Craw, on call physician regarding case. As per physician recommended IV fluids the patient is okay can be discharged home with followup as an outpatient-reported the patient is an appointment on 08/14/2014.  9:37 AM This provider re-assessed the patient. Patient reported that she's doing well and feels a lot better. Stated that she eat some of her breakfast and was able to keep it down. Stated that this is the best she's felt a long time. Abdominal exam performed-negative ecchymosis or signs of trauma. Bowel sounds normoactive. Abdomen soft with negative pain upon palpation.  11:39 PM This provider re-assessed the patient. Reported that she is doing well and feels better. Reported that she did not feel dizzy since being here and reported no chest pain, shortness of breath, difficulty breathing, dizziness, headache, abdominal cramping, vaginal bleeding. Reported that her abdomen does not feel full.   11:59 PM This provider spoke with Dr. Fredia Sorrow - discussed case and labs in great detail. As  per physician reported that patient can be discharged home. Recommended outpatient follow-up.   Results for orders placed during the hospital encounter of 08/01/14  CBC WITH DIFFERENTIAL      Result Value Ref Range   WBC 12.4 (*) 4.0 - 10.5 K/uL   RBC 4.02  3.87 - 5.11 MIL/uL   Hemoglobin 12.0  12.0 - 15.0 g/dL   HCT 16.1 (*) 09.6 - 04.5 %   MCV 89.3  78.0 - 100.0 fL   MCH 29.9  26.0 - 34.0 pg   MCHC 33.4  30.0 - 36.0 g/dL   RDW 40.9  81.1 - 91.4 %   Platelets 235  150 - 400 K/uL   Neutrophils Relative % 78 (*) 43 - 77 %   Neutro Abs 9.7 (*) 1.7 - 7.7 K/uL   Lymphocytes Relative 11 (*) 12 - 46 %   Lymphs Abs 1.4  0.7 - 4.0 K/uL   Monocytes Relative 10  3 - 12 %    Monocytes Absolute 1.2 (*) 0.1 - 1.0 K/uL   Eosinophils Relative 1  0 - 5 %   Eosinophils Absolute 0.1  0.0 - 0.7 K/uL   Basophils Relative 0  0 - 1 %   Basophils Absolute 0.0  0.0 - 0.1 K/uL  COMPREHENSIVE METABOLIC PANEL      Result Value Ref Range   Sodium 131 (*) 137 - 147 mEq/L   Potassium 4.0  3.7 - 5.3 mEq/L   Chloride 97  96 - 112 mEq/L   CO2 19  19 - 32 mEq/L   Glucose, Bld 93  70 - 99 mg/dL   BUN 9  6 - 23 mg/dL   Creatinine, Ser 7.82 (*) 0.50 - 1.10 mg/dL   Calcium 9.2  8.4 - 95.6 mg/dL   Total Protein 6.8  6.0 - 8.3 g/dL   Albumin 2.8 (*) 3.5 - 5.2 g/dL   AST 34  0 - 37 U/L   ALT 47 (*) 0 - 35 U/L   Alkaline Phosphatase 57  39 - 117 U/L   Total Bilirubin 0.4  0.3 - 1.2 mg/dL   GFR calc non Af Amer >90  >90 mL/min   GFR calc Af Amer >90  >90 mL/min   Anion gap 15  5 - 15  TROPONIN I      Result Value Ref Range   Troponin I <0.30  <0.30 ng/mL  HCG, SERUM, QUALITATIVE      Result Value Ref Range   Preg, Serum POSITIVE (*) NEGATIVE  HCG, QUANTITATIVE, PREGNANCY      Result Value Ref Range   hCG, Beta Chain, Quant, S 8144 (*) <5 mIU/mL  URINALYSIS, ROUTINE W REFLEX MICROSCOPIC      Result Value Ref Range   Color, Urine AMBER (*) YELLOW   APPearance CLOUDY (*) CLEAR   Specific Gravity, Urine 1.024  1.005 - 1.030   pH 6.0  5.0 - 8.0   Glucose, UA NEGATIVE  NEGATIVE mg/dL   Hgb urine dipstick NEGATIVE  NEGATIVE   Bilirubin Urine NEGATIVE  NEGATIVE   Ketones, ur NEGATIVE  NEGATIVE mg/dL   Protein, ur NEGATIVE  NEGATIVE mg/dL   Urobilinogen, UA 0.2  0.0 - 1.0 mg/dL   Nitrite NEGATIVE  NEGATIVE   Leukocytes, UA NEGATIVE  NEGATIVE  CBG MONITORING, ED      Result Value Ref Range   Glucose-Capillary 98  70 - 99 mg/dL    Labs Review Labs Reviewed  CBC WITH DIFFERENTIAL -  Abnormal; Notable for the following:    WBC 12.4 (*)    HCT 35.9 (*)    Neutrophils Relative % 78 (*)    Neutro Abs 9.7 (*)    Lymphocytes Relative 11 (*)    Monocytes Absolute 1.2 (*)    All  other components within normal limits  COMPREHENSIVE METABOLIC PANEL - Abnormal; Notable for the following:    Sodium 131 (*)    Creatinine, Ser 0.49 (*)    Albumin 2.8 (*)    ALT 47 (*)    All other components within normal limits  HCG, SERUM, QUALITATIVE - Abnormal; Notable for the following:    Preg, Serum POSITIVE (*)    All other components within normal limits  HCG, QUANTITATIVE, PREGNANCY - Abnormal; Notable for the following:    hCG, Beta Chain, Quant, S 8144 (*)    All other components within normal limits  URINALYSIS, ROUTINE W REFLEX MICROSCOPIC - Abnormal; Notable for the following:    Color, Urine AMBER (*)    APPearance CLOUDY (*)    All other components within normal limits  TROPONIN I  CBG MONITORING, ED    Imaging Review No results found.   EKG Interpretation   Date/Time:  Friday August 01 2014 08:00:24 EDT Ventricular Rate:  93 PR Interval:  124 QRS Duration: 82 QT Interval:  347 QTC Calculation: 432 R Axis:   75 Text Interpretation:  Sinus rhythm Low voltage, precordial leads  Borderline T abnormalities, inferior leads Baseline wander in lead(s) II  III aVF No significant change was found Confirmed by [facility]  MD, TREY  ([id number]) on 08/01/2014 10:05:58 AM      MDM   Final diagnoses:  Syncope, unspecified syncope type  Dehydration  Pregnant    Medications  sodium chloride 0.9 % bolus 1,000 mL (0 mLs Intravenous Stopped 08/01/14 1010)   Filed Vitals:   08/01/14 0907 08/01/14 0908 08/01/14 0909 08/01/14 1121  BP: 111/72 115/63 120/69 109/70  Pulse: 91 75 83 102  Temp:    98.7 F (37.1 C)  TempSrc:    Oral  Resp:    16  SpO2:    99%    EKG noted normal sinus rhythm with heart rate of 92 beats per minute-no significant change since last tracing. Troponin negative elevation. CBC noted mildly elevated white blood cell count 12.4. CMP noted mildly low creatinine of 0.49. CBG 98. Beta hCG 8144. Urinalysis negative for infection. Patient given IV  fluids while in the ED setting. Patient able to tolerate fluids PO without difficulty and negative episodes of emesis while in the ED setting. Patient monitored for at least 4 hours without changes to status or onset of dizziness/syncope. Patient ambulated well with negative ataxia or shifting in gait - negative red flags. Fetal heart tones noted to be 145 bpm - baby moving well - good exam according to OBGYN nurse - no decelerations. Patient seen and assessed by attending physician, Dr. Rodney Cruise who agrees to plan of discharge. This provider spoke with OBGYN physician, Dr. Fredia Sorrow - on call physician - who agreed to plan of discharge. Reported that patient has an appointment next week with OBGYN. Patient feels comfortable to go home. Suspicion a syncopal episode secondary to dehydration and not eating enough. Patient stable, afebrile. Patient not septic appearing. discharged patient. Discussed with patient to rest and stay hydrated - discussed with patient the importance of eating. Discussed with patient to closely monitor symptoms and if symptoms are to worsen  or change to report back to the ED - strict return instructions given.  Patient agreed to plan of care, understood, all questions answered.   Raymon Mutton, PA-C 08/01/14 [address], PA-C 08/01/14 1217

## 2014-08-01 NOTE — ED Notes (Signed)
Pt was at gas station getting a drink then woke up on the floor with people standing around her.  Pt states that she is around 26-[redacted] weeks pregnant.  Pt states that she has been nauseated and vomiting the past several days.   Pt states that she has gotten lightheaded in the past but never went to doctor about it.

## 2014-08-01 NOTE — Progress Notes (Signed)
Dr Fredia Sorrow notified of pt's status. FH remains with spont accels, no decels, 145bpm, baby active. No uterine cramping or contractions.Pt to eat at this time. Pt cleared obstetrically. To keep next scheduled OB appoint, eat small frequent meals, reviewed signs and symptoms of ROM and labor. To call MD office if dizzy spelss, N/V continue.

## 2014-08-01 NOTE — Discharge Instructions (Signed)
Please call your doctor for a followup appointment within 24-48 hours. When you talk to your doctor please let them know that you were seen in the emergency department and have them acquire all of your records so that they can discuss the findings with you and formulate a treatment plan to fully care for your new and ongoing problems. Please call and set up an appointment with your primary care provider Please keep appointment with OBGYN next week Please rest and stay hydrated - please eat every day  Please avoid any physical or strenuous activity  Please continue to monitor symptoms closely and if symptoms are to worsen or change (fever greater than 101, chills, sweating, nausea, vomiting, chest pain, shortness of breathe, difficulty breathing, weakness, numbness, tingling, worsening or changes to pain pattern, fainting, dizziness, headache, abdominal pain, abdominal cramping, vaginal bleeding/spotting, discharge) please report back to the Emergency Department immediately.   Syncope Syncope is a medical term for fainting or passing out. This means you lose consciousness and drop to the ground. People are generally unconscious for less than 5 minutes. You may have some muscle twitches for up to 15 seconds before waking up and returning to normal. Syncope occurs more often in older adults, but it can happen to anyone. While most causes of syncope are not dangerous, syncope can be a sign of a serious medical problem. It is important to seek medical care.  CAUSES  Syncope is caused by a sudden drop in blood flow to the brain. The specific cause is often not determined. Factors that can bring on syncope include:  Taking medicines that lower blood pressure.  Sudden changes in posture, such as standing up quickly.  Taking more medicine than prescribed.  Standing in one place for too long.  Seizure disorders.  Dehydration and excessive exposure to heat.  Low blood sugar (hypoglycemia).  Straining  to have a bowel movement.  Heart disease, irregular heartbeat, or other circulatory problems.  Fear, emotional distress, seeing blood, or severe pain. SYMPTOMS  Right before fainting, you may:  Feel dizzy or light-headed.  Feel nauseous.  See all white or all black in your field of vision.  Have cold, clammy skin. DIAGNOSIS  Your health care provider will ask about your symptoms, perform a physical exam, and perform an electrocardiogram (ECG) to record the electrical activity of your heart. Your health care provider may also perform other heart or blood tests to determine the cause of your syncope which may include:  Transthoracic echocardiogram (TTE). During echocardiography, sound waves are used to evaluate how blood flows through your heart.  Transesophageal echocardiogram (TEE).  Cardiac monitoring. This allows your health care provider to monitor your heart rate and rhythm in real time.  Holter monitor. This is a portable device that records your heartbeat and can help diagnose heart arrhythmias. It allows your health care provider to track your heart activity for several days, if needed.  Stress tests by exercise or by giving medicine that makes the heart beat faster. TREATMENT  In most cases, no treatment is needed. Depending on the cause of your syncope, your health care provider may recommend changing or stopping some of your medicines. HOME CARE INSTRUCTIONS  Have someone stay with you until you feel stable.  Do not drive, use machinery, or play sports until your health care provider says it is okay.  Keep all follow-up appointments as directed by your health care provider.  Lie down right away if you start feeling like you might  faint. Breathe deeply and steadily. Wait until all the symptoms have passed.  Drink enough fluids to keep your urine clear or pale yellow.  If you are taking blood pressure or heart medicine, get up slowly and take several minutes to sit  and then stand. This can reduce dizziness. SEEK IMMEDIATE MEDICAL CARE IF:   You have a severe headache.  You have unusual pain in the chest, abdomen, or back.  You are bleeding from your mouth or rectum, or you have black or tarry stool.  You have an irregular or very fast heartbeat.  You have pain with breathing.  You have repeated fainting or seizure-like jerking during an episode.  You faint when sitting or lying down.  You have confusion.  You have trouble walking.  You have severe weakness.  You have vision problems. If you fainted, call your local emergency services (911 in [place] Do not drive yourself to the hospital.  MAKE SURE YOU:  Understand these instructions.  Will watch your condition.  Will get help right away if you are not doing well or get worse. Document Released: 11/07/2005 Document Revised: 11/12/2013 Document Reviewed: 01/06/2012 [facility] Patient Information 2015 [place], [place]. This information is not intended to replace advice given to you by your health care provider. Make sure you discuss any questions you have with your health care provider.

## 2014-08-08 NOTE — ED Provider Notes (Signed)
Medical screening examination/treatment/procedure(s) were conducted as a shared visit with non-physician practitioner(s) and myself.  I personally evaluated the patient during the encounter.   EKG Interpretation   Date/Time:  Friday August 01 2014 08:00:24 EDT Ventricular Rate:  93 PR Interval:  124 QRS Duration: 82 QT Interval:  347 QTC Calculation: 432 R Axis:   75 Text Interpretation:  Sinus rhythm Low voltage, precordial leads  Borderline T abnormalities, inferior leads Baseline wander in lead(s) II  III aVF No significant change was found Confirmed by [facility]  MD, TREY  ([id number]) on 08/01/2014 10:05:58 AM      28 yo pregnant female presenting after syncope.  On exam, well appearing, nontoxic, not distressed, normal respiratory effort, normal perfusion, heart sounds normal with RRR, lungs CTAB, abdomen soft and nontender.  Pt remained stable during ED course.  Syncope likely orthostatic based on her history.  OB rapid response evaluated and case was discussed with OB attending prior to discharge.  Clinical Impression: 1. Syncope, unspecified syncope type   2. Dehydration   3. Pregnant       Candyce Churn III, MD 08/08/14 [phone]

## 2014-08-08 NOTE — ED Provider Notes (Signed)
Medical screening examination/treatment/procedure(s) were conducted as a shared visit with non-physician practitioner(s) and myself.  I personally evaluated the patient during the encounter.   EKG Interpretation   Date/Time:  Friday August 01 2014 08:00:24 EDT Ventricular Rate:  93 PR Interval:  124 QRS Duration: 82 QT Interval:  347 QTC Calculation: 432 R Axis:   75 Text Interpretation:  Sinus rhythm Low voltage, precordial leads  Borderline T abnormalities, inferior leads Baseline wander in lead(s) II  III aVF No significant change was found Confirmed by [facility]  MD, TREY  ([id number]) on 08/01/2014 10:05:58 AM        Candyce Churn III, MD 08/08/14 1525

## 2014-09-19 ENCOUNTER — Encounter (HOSPITAL_COMMUNITY): Payer: Self-pay | Admitting: *Deleted

## 2014-09-19 ENCOUNTER — Inpatient Hospital Stay (HOSPITAL_COMMUNITY)
Admission: AD | Admit: 2014-09-19 | Discharge: 2014-09-21 | DRG: 781 | Disposition: A | Discharge status: Home / Self Care | Payer: BC Managed Care – PPO | Source: Ambulatory Visit | Attending: Obstetrics and Gynecology | Admitting: Obstetrics and Gynecology

## 2014-09-19 DIAGNOSIS — Z87891 Personal history of nicotine dependence: Secondary | ICD-10-CM | POA: Diagnosis not present

## 2014-09-19 DIAGNOSIS — O1203 Gestational edema, third trimester: Secondary | ICD-10-CM | POA: Diagnosis present

## 2014-09-19 DIAGNOSIS — Z3A34 34 weeks gestation of pregnancy: Secondary | ICD-10-CM | POA: Diagnosis present

## 2014-09-19 DIAGNOSIS — O1403 Mild to moderate pre-eclampsia, third trimester: Secondary | ICD-10-CM | POA: Diagnosis present

## 2014-09-19 DIAGNOSIS — O1490 Unspecified pre-eclampsia, unspecified trimester: Secondary | ICD-10-CM | POA: Insufficient documentation

## 2014-09-19 DIAGNOSIS — O1493 Unspecified pre-eclampsia, third trimester: Secondary | ICD-10-CM

## 2014-09-19 DIAGNOSIS — O139 Gestational [pregnancy-induced] hypertension without significant proteinuria, unspecified trimester: Secondary | ICD-10-CM | POA: Diagnosis present

## 2014-09-19 HISTORY — DX: Essential (primary) hypertension: I10

## 2014-09-19 LAB — COMPREHENSIVE METABOLIC PANEL
ALT: 28 U/L (ref 0–35)
ANION GAP: 11 (ref 5–15)
AST: 31 U/L (ref 0–37)
Albumin: 2.3 g/dL — ABNORMAL LOW (ref 3.5–5.2)
Alkaline Phosphatase: 111 U/L (ref 39–117)
BILIRUBIN TOTAL: 0.2 mg/dL — AB (ref 0.3–1.2)
BUN: 8 mg/dL (ref 6–23)
CHLORIDE: 102 meq/L (ref 96–112)
CO2: 20 meq/L (ref 19–32)
Calcium: 8.6 mg/dL (ref 8.4–10.5)
Creatinine, Ser: 0.54 mg/dL (ref 0.50–1.10)
GFR calc Af Amer: >90 mL/min (ref >90)
Glucose, Bld: 77 mg/dL (ref 70–99)
POTASSIUM: 3.9 meq/L (ref 3.7–5.3)
Sodium: 133 mEq/L — ABNORMAL LOW (ref 137–147)
Total Protein: 6.5 g/dL (ref 6.0–8.3)

## 2014-09-19 LAB — PROTEIN / CREATININE RATIO, URINE
Creatinine, Urine: 162.68 mg/dL
Protein Creatinine Ratio: 0.53 — ABNORMAL HIGH (ref 0.00–0.15)
Total Protein, Urine: 86.1 mg/dL

## 2014-09-19 LAB — URINALYSIS, ROUTINE W REFLEX MICROSCOPIC
BILIRUBIN URINE: NEGATIVE
Glucose, UA: NEGATIVE mg/dL
Ketones, ur: NEGATIVE mg/dL
Leukocytes, UA: NEGATIVE
Nitrite: NEGATIVE
Protein, ur: 100 mg/dL — AB
SPECIFIC GRAVITY, URINE: 1.025 (ref 1.005–1.030)
Urobilinogen, UA: 1 mg/dL (ref 0.0–1.0)
pH: 6 (ref 5.0–8.0)

## 2014-09-19 LAB — CBC
HEMATOCRIT: 34.3 % — AB (ref 36.0–46.0)
HEMOGLOBIN: 12 g/dL (ref 12.0–15.0)
MCH: 30.8 pg (ref 26.0–34.0)
MCHC: 35 g/dL (ref 30.0–36.0)
MCV: 88.2 fL (ref 78.0–100.0)
Platelets: 213 10*3/uL (ref 150–400)
RBC: 3.89 MIL/uL (ref 3.87–5.11)
RDW: 13.2 % (ref 11.5–15.5)
WBC: 11.4 10*3/uL — AB (ref 4.0–10.5)

## 2014-09-19 LAB — LACTATE DEHYDROGENASE: LDH: 254 U/L — ABNORMAL HIGH (ref 94–250)

## 2014-09-19 LAB — URINE MICROSCOPIC-ADD ON

## 2014-09-19 LAB — URIC ACID: Uric Acid, Serum: 5.2 mg/dL (ref 2.4–7.0)

## 2014-09-19 MED ORDER — LAMOTRIGINE 25 MG PO TABS
25.0000 mg | ORAL_TABLET | Freq: Every day | ORAL | Status: DC
  Administered 2014-09-19 – 2014-09-20 (×2): 25 mg via ORAL
  Filled 2014-09-19 (×3): qty 1

## 2014-09-19 MED ORDER — ARIPIPRAZOLE 5 MG PO TABS
5.0000 mg | ORAL_TABLET | Freq: Every day | ORAL | Status: DC
  Administered 2014-09-19 – 2014-09-20 (×2): 5 mg via ORAL
  Filled 2014-09-19 (×3): qty 1

## 2014-09-19 MED ORDER — ACETAMINOPHEN 325 MG PO TABS: 650.0000 mg | ORAL_TABLET | ORAL | Status: DC | PRN

## 2014-09-19 MED ORDER — ZOLPIDEM TARTRATE 5 MG PO TABS: 5.0000 mg | ORAL_TABLET | Freq: Every evening | ORAL | Status: DC | PRN

## 2014-09-19 MED ORDER — DOCUSATE SODIUM 100 MG PO CAPS
100.0000 mg | ORAL_CAPSULE | Freq: Every day | ORAL | Status: DC
  Filled 2014-09-19 (×2): qty 1

## 2014-09-19 MED ORDER — PRENATAL MULTIVITAMIN CH
1.0000 | ORAL_TABLET | Freq: Every day | ORAL | Status: DC
  Administered 2014-09-19: 1 via ORAL
  Filled 2014-09-19 (×2): qty 1

## 2014-09-19 MED ORDER — CALCIUM CARBONATE ANTACID 500 MG PO CHEW: 2.0000 | CHEWABLE_TABLET | ORAL | Status: DC | PRN

## 2014-09-19 NOTE — MAU Provider Note (Signed)
History     CSN: [id number]  Arrival date and time: 09/19/14 1547   First Provider Initiated Contact with Patient 09/19/14 1724      Chief Complaint  Patient presents with  . Facial Swelling  . Leg Swelling  . Abdominal Pain  . Shortness of Breath   HPI  Ms. Ana Best is 28 y.o.female G3P0020 at 68w0dwho presents with increased swelling, and increased BP readings (patient was seen in the office today) Feet and ankles stared swelling a few weeks ago and has progressed to her upper body. She feels like the swelling is worse in her feet, arms and face. 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 +fetal movement, denies vaginal bleeding.   OB History   Grav Para Term Preterm Abortions TAB SAB Ect Mult Living   _0 Past Medical History  Diagnosis Date  . Anxiety   . Depression   . Bipolar 1 disorder     Past Surgical History  Procedure Laterality Date  . Tonsillectomy    . Dilation and curettage of uterus    . Wisdom tooth extraction      Family History  Problem Relation Age of Onset  . Depression Father   . Anxiety disorder Father   . ADD / ADHD Brother     History  Substance Use Topics  . Smoking status: Former Smoker -- 0.50 packs/day for 10 years    Types: Cigarettes  . Smokeless tobacco: Never Used  . Alcohol Use: Yes     Comment: drinks- 3-4 beers    Allergies: No Known Allergies  Prescriptions prior to admission  Medication Sig Dispense Refill  . amphetamine-dextroamphetamine (ADDERALL) 30 MG tablet Take 30 mg by mouth daily.      . ARIPiprazole (ABILIFY) 5 MG tablet Take 1 tablet (5 mg total) by mouth daily.  30 tablet  1  . lamoTRIgine (LAMICTAL) 25 MG tablet Take 1 tablet (25 mg total) by mouth daily.  30 tablet  1  . Prenatal Vit-Fe Fumarate-FA (PRENATAL MULTIVITAMIN) TABS tablet Take 1 tablet by mouth daily at 12 noon.       Results for  orders placed during the hospital encounter of 09/19/14 (from the past 48 hour(s))  URINALYSIS, ROUTINE W REFLEX MICROSCOPIC     Status: Abnormal   Collection Time    09/19/14  4:20 PM      Result Value Ref Range   Color, Urine YELLOW  YELLOW   APPearance CLEAR  CLEAR   Specific Gravity, Urine 1.025  1.005 - 1.030   pH 6.0  5.0 - 8.0   Glucose, UA NEGATIVE  NEGATIVE mg/dL   Hgb urine dipstick TRACE (*) NEGATIVE   Bilirubin Urine NEGATIVE  NEGATIVE   Ketones, ur NEGATIVE  NEGATIVE mg/dL   Protein, ur 100 (*) NEGATIVE mg/dL   Urobilinogen, UA 1.0  0.0 - 1.0 mg/dL   Nitrite NEGATIVE  NEGATIVE   Leukocytes, UA NEGATIVE  NEGATIVE  PROTEIN / CREATININE RATIO, URINE     Status: Abnormal   Collection Time    09/19/14  4:20 PM      Result Value Ref Range   Creatinine, Urine 162.68     Total Protein, Urine 86.1     Comment: NO NORMAL RANGE ESTABLISHED FOR THIS TEST   Protein Creatinine Ratio  0.53 (*) 0.00 - 0.15  URINE MICROSCOPIC-ADD ON     Status: Abnormal   Collection Time    09/19/14  4:20 PM      Result Value Ref Range   Squamous Epithelial / LPF MANY (*) RARE   WBC, UA 3-6  <3 WBC/hpf   RBC / HPF 0-2  <3 RBC/hpf   Bacteria, UA MANY (*) RARE   Urine-Other MUCOUS PRESENT    CBC     Status: Abnormal   Collection Time    09/19/14  5:31 PM      Result Value Ref Range   WBC 11.4 (*) 4.0 - 10.5 K/uL   RBC 3.89  3.87 - 5.11 MIL/uL   Hemoglobin 12.0  12.0 - 15.0 g/dL   HCT 16.1 (*) 98.8 - 22.0 %   MCV 88.2  78.0 - 100.0 fL   MCH 30.8  26.0 - 34.0 pg   MCHC 35.0  30.0 - 36.0 g/dL   RDW 88.6  85.5 - 25.0 %   Platelets 213  150 - 400 K/uL  COMPREHENSIVE METABOLIC PANEL     Status: Abnormal   Collection Time    09/19/14  5:31 PM      Result Value Ref Range   Sodium 133 (*) 137 - 147 mEq/L   Potassium 3.9  3.7 - 5.3 mEq/L   Chloride 102  96 - 112 mEq/L   CO2 20  19 - 32 mEq/L   Glucose, Bld 77  70 - 99 mg/dL   BUN 8  6 - 23 mg/dL   Creatinine, Ser 6.04  0.50 - 1.10 mg/dL    Calcium 8.6  8.4 - 93.3 mg/dL   Total Protein 6.5  6.0 - 8.3 g/dL   Albumin 2.3 (*) 3.5 - 5.2 g/dL   AST 31  0 - 37 U/L   ALT 28  0 - 35 U/L   Alkaline Phosphatase 111  39 - 117 U/L   Total Bilirubin 0.2 (*) 0.3 - 1.2 mg/dL   GFR calc non Af Amer >90  >90 mL/min   GFR calc Af Amer >90  >90 mL/min   Comment: (NOTE)     The eGFR has been calculated using the CKD EPI equation.     This calculation has not been validated in all clinical situations.     eGFR's persistently <90 mL/min signify possible Chronic Kidney     Disease.   Anion gap 11  5 - 15  URIC ACID     Status: None   Collection Time    09/19/14  5:31 PM      Result Value Ref Range   Uric Acid, Serum 5.2  2.4 - 7.0 mg/dL  LACTATE DEHYDROGENASE     Status: Abnormal   Collection Time    09/19/14  5:31 PM      Result Value Ref Range   LDH 254 (*) 94 - 250 U/L    Review of Systems  Constitutional: Negative for fever and chills.  Eyes: Negative for blurred vision (occasional floaters. ).  Respiratory: Positive for shortness of breath (SOB all the time; has worsened over the past month).   Cardiovascular: Negative for chest pain.  Gastrointestinal: Negative for abdominal pain.  Neurological: Negative for dizziness and headaches.   Physical Exam   Blood pressure 136/89, pulse 63, temperature 97.4 F (36.3 C), temperature source Oral, resp. rate 18, height 5\' 7"  (1.702 m), weight 108.047 kg (238 lb 3.2 oz), last menstrual period 12/22/2013,  SpO2 100.00%.  Physical Exam  Constitutional: She is oriented to person, place, and time. She appears well-developed and well-nourished. No distress.  HENT:  Head: Normocephalic.  Eyes: Pupils are equal, round, and reactive to light.  Neck: Neck supple.  Cardiovascular: Normal rate and normal heart sounds.   Respiratory: Breath sounds normal. No respiratory distress. She has no wheezes.  GI: Soft. She exhibits no distension. There is no tenderness. There is no rebound.   Musculoskeletal:       Right ankle: She exhibits swelling.       Left ankle: She exhibits swelling.       Right hand: She exhibits swelling.       Left hand: She exhibits swelling.  Neurological: She is alert and oriented to person, place, and time. She has normal reflexes.  Skin: Skin is warm. She is not diaphoretic.  Psychiatric: Her behavior is normal.   Fetal Tracing: Baseline: 125 bpm  Variability: Moderate  Accelerations: 15x15 Decelerations: None Toco: None  MAU Course  Procedures None  MDM CBC CMET Uric acid LDH UA Protein creatine ratio NST Consulted with DR. Ross> awaiting lab results  100% on RA> patient does not appear to be in distress. Patient and family informed of lab results and discussed admission for observation> questions and concerns addressed.   Assessment and Plan   A: Preeclampsia in the third trimester  P:  Admit to ante for observation per Dr. Harrington Challenger.  24 hour urine collection   [place], NP 09/19/2014 7:29 PM

## 2014-09-19 NOTE — H&P (Signed)
Ana BrightlyLisbeth Best is a 28 y.o. female presenting for elevated blood pressures  28 year old gravida 3 para 0020 at 3834 weeks and 0 days estimated gestational age who presents to maternity admissions after being evaluated in the office today. The patient was noted to have worsening elevation of her blood pressures and increased edema. She was sent to MAU for evaluation.the patient was noted to have elevated blood pressures at approximately 32 weeks. At that time a 24-hour urine was performed and returned 187 mg of protein. Preeclampsia labs were normal. Her pregnancy has been complicated by an idiopathic elevated maternal serum AFP of 4.19 MOM. Patient was evaluated by maternal fetal medicine. Ultrasound was within normal limits. The plan was to follow the patient with serial growth ultrasounds every 6 weeks and surveille closely for onset of preeclampsia. History OB History   Grav Para Term Preterm Abortions TAB SAB Ect Mult Living   3    2 2          Past Medical History  Diagnosis Date  . Anxiety   . Depression   . Bipolar 1 disorder    Past Surgical History  Procedure Laterality Date  . Tonsillectomy    . Dilation and curettage of uterus    . Wisdom tooth extraction     Family History: family history includes ADD / ADHD in her brother; Anxiety disorder in her father; Depression in her father. Social History:  reports that she has quit smoking. Her smoking use included Cigarettes. She has a 5 pack-year smoking history. She has never used smokeless tobacco. She reports that she drinks alcohol. She reports that she does not use illicit drugs.   ROS: as above    Blood pressure 153/83, pulse 77, resp. rate 20, height 5\' 7"  (1.702 m), weight 108.047 kg (238 lb 3.2 oz), last menstrual period 12/22/2013, SpO2 100.00%. Exam Physical Exam  Prenatal labs: ABO, Rh:   Antibody:   Rubella:   RPR:    HBsAg:    HIV:    GBS:     Assessment/Plan: 1) Admit antenatal for 24 hour urine and serial  blood pressures   Ana Best H. 09/19/2014, 7:08 PM

## 2014-09-19 NOTE — MAU Note (Signed)
Patient states she has been having increasing swelling in the feet, legs, hands and face over the past couple of days. At night has shortness of breath and has caused vomiting last night. Reports some abdominal tightening. Denies bleeding or leaking. Reports less than normal fetal movement. Fetal heart rate in triage between 117-125.

## 2014-09-19 NOTE — MAU Note (Signed)
Urine in lab 

## 2014-09-19 NOTE — MAU Note (Signed)
C/o edema for past month- increasing in the past 2 weeks; sent from OB's office for further revaluation; G1; 34 weeks; increased bps;

## 2014-09-20 LAB — CREATININE CLEARANCE, URINE, 24 HOUR
CREAT CLEAR: 183 mL/min — AB (ref 75–115)
CREATININE, URINE: 47.64 mg/dL
CREATININE: 0.56 mg/dL (ref 0.50–1.10)
Collection Interval-CRCL: 24 hours
Creatinine, 24H Ur: 1477 mg/d (ref 700–1800)
Urine Total Volume-CRCL: 3100 mL

## 2014-09-20 LAB — COMPREHENSIVE METABOLIC PANEL
ALT: 25 U/L (ref 0–35)
AST: 23 U/L (ref 0–37)
Albumin: 1.9 g/dL — ABNORMAL LOW (ref 3.5–5.2)
Alkaline Phosphatase: 102 U/L (ref 39–117)
Anion gap: 11 (ref 5–15)
BUN: 8 mg/dL (ref 6–23)
CO2: 21 mEq/L (ref 19–32)
Calcium: 8.6 mg/dL (ref 8.4–10.5)
Chloride: 102 mEq/L (ref 96–112)
Creatinine, Ser: 0.56 mg/dL (ref 0.50–1.10)
GFR calc non Af Amer: >90 mL/min (ref >90)
Glucose, Bld: 86 mg/dL (ref 70–99)
Potassium: 4.2 mEq/L (ref 3.7–5.3)
SODIUM: 134 meq/L — AB (ref 137–147)
TOTAL PROTEIN: 5.1 g/dL — AB (ref 6.0–8.3)
Total Bilirubin: 0.2 mg/dL — ABNORMAL LOW (ref 0.3–1.2)

## 2014-09-20 LAB — LACTATE DEHYDROGENASE: LDH: 167 U/L (ref 94–250)

## 2014-09-20 LAB — CBC
HEMATOCRIT: 33.1 % — AB (ref 36.0–46.0)
HEMOGLOBIN: 11.5 g/dL — AB (ref 12.0–15.0)
MCH: 31.1 pg (ref 26.0–34.0)
MCHC: 34.7 g/dL (ref 30.0–36.0)
MCV: 89.5 fL (ref 78.0–100.0)
Platelets: 203 10*3/uL (ref 150–400)
RBC: 3.7 MIL/uL — AB (ref 3.87–5.11)
RDW: 13.2 % (ref 11.5–15.5)
WBC: 9.5 10*3/uL (ref 4.0–10.5)

## 2014-09-20 LAB — PROTEIN, URINE, 24 HOUR
Collection Interval-UPROT: 24 hours
PROTEIN 24H UR: 1178 mg/d — AB (ref <150)
Protein, Urine: 38 mg/dL — ABNORMAL HIGH (ref 5–24)
URINE TOTAL VOLUME-UPROT: 3100 mL

## 2014-09-20 LAB — URIC ACID: URIC ACID, SERUM: 5.1 mg/dL (ref 2.4–7.0)

## 2014-09-20 MED ORDER — PRENATAL MULTIVITAMIN CH
1.0000 | ORAL_TABLET | Freq: Every day | ORAL | Status: DC
  Administered 2014-09-20: 1 via ORAL
  Filled 2014-09-20: qty 1

## 2014-09-20 MED ORDER — FAMOTIDINE 20 MG PO TABS
40.0000 mg | ORAL_TABLET | Freq: Two times a day (BID) | ORAL | Status: DC
  Administered 2014-09-20 – 2014-09-21 (×3): 40 mg via ORAL
  Filled 2014-09-20 (×3): qty 2

## 2014-09-20 NOTE — Plan of Care (Signed)
Problem: Consults Goal: Antepartum Patient Education Outcome: Progressing Pt. Informed of plan of care. Goal: Birthing Suites Patient Information Press F2 to bring up selections list   Pt < [redacted] weeks EGA Goal: Skin Care Protocol Initiated - if Braden Score 18 or less If consults are not indicated, leave blank or document N/A  Outcome: Not Applicable Date Met:  24/40/10 Braden scale 21. Goal: Lactation Consult Initiated if indicated Outcome: Not Applicable Date Met:  27/25/36 Pt. Wanting to bottle feed. Goal: Orientation to unit: [place] (smoking, visitation, chaplain services, helpline)  Outcome: Progressing Oriented to unit and plan of care on admission.  Problem: Phase I Progression Outcomes Goal: Contractions < 5-6/hour Outcome: Progressing Pt. With irregular contractions. Goal: Maintains reassuring Fetal Heart Rate Outcome: Progressing Continuous FHR monitoring with reassuring pattern. Goal: OOB as tolerated unless otherwise ordered Outcome: Progressing Pt. With BRP.  Problem: Phase II Progression Outcomes Goal: Electronic fetal monitoring(Doppler,Continuous,Intermittent) EFM (Doppler, Continuous, Intermittent)  Outcome: Progressing Continuous FHR monitoring, reassuring pattern. Goal: Tolerating diet Outcome: Progressing Pt. On reg. Diet, tolerated well. Goal: Mattress in use Mattress in use Geneticist, molecular, Med/Surg, Regular, Birthing Suite, Other)  Outcome: Progressing Reg. Goal: Output > 30 ml/hr or voiding qs Outcome: Progressing Pt. With adequate UO, 24 hr. Urine collection in process. Goal: Labs/tests as ordered Labs/tests as ordered (Magnesium level, CBG's, CBC, CMET, 24 hr Urine, Amniocentesis, Ultrasound, Other)  Outcome: Progressing PIH labs drawn on 09/19/14, for repeat in AM 09/20/14.

## 2014-09-21 ENCOUNTER — Inpatient Hospital Stay (HOSPITAL_COMMUNITY): Payer: BC Managed Care – PPO

## 2014-09-21 NOTE — Progress Notes (Signed)
Discharge teaching completed with patient providing teachback. Patient to follow up with care provider this week in the office. Family member at patient side. Patient states she would like to walk out and not use a wheelchair. S/S to return for treatment reviewed with patient. Patient is aware of limitations.

## 2014-09-21 NOTE — Discharge Instructions (Signed)
Hypertension During Pregnancy Hypertension is also called high blood pressure. Blood pressure moves blood in your body. Sometimes, the force that moves the blood becomes too strong. When you are pregnant, this condition should be watched carefully. It can cause problems for you and your baby. HOME CARE   Make and keep all of your doctor visits.  Take medicine as told by your doctor. Tell your doctor about all medicines you take.  Eat very little salt.  Exercise regularly. Early Elective Birth Early elective birth refers to making a choice to have a baby before the time the baby is due. The length of a pregnancy is 9 months, or 40 weeks, starting from the beginning of a woman's last menstrual period. Most women naturally go into labor around 40 weeks of gestation. A full-term pregnancy is considered between 37 weeks and 42 weeks of gestation. Currently, early elective births can take place sometime after 39 weeks of gestation. Most health care providers practice within the guidelines of delivering a baby no later than 42 weeks of gestation and no earlier than 39 weeks of gestation. There are exceptions to this time interval, and the risks involved to the mother and baby need to be considered in those cases.  Induction of labor refers to the use of medicines to bring aboutcontractions. Labor is when the cervix starts to widen (dilate). Active labor is when there are contractions and the cervix has dilated to at least 4 cm. Oftentimes, the earlier a mother is in her pregnancy, the longer it takes to get induced. When the cervix is ready (dilated and soft), an induction may take less than a day. However, when a cervix is far away from being ready (long, closed, and firm), it may take days in a hospital for labor to start.  Currently, 39 weeks of gestation is considered the earliest a health care providershould start the induction process. This is because the longer the baby stays inside the uterus, the  lower the risks are to both the baby and mother. However, sometimes there are very good reasons for a pregnancy to be induced before 39 weeks of gestation. These exceptions are specific to each individual pregnancy and need to be considered on a case-by-case basis. A good reason to induce one pregnancy may not be a good reason for another pregnancy.  REASONS FOR ELECTIVE BIRTH It may be safer to induce labor before 39 weeks of gestation if:  A woman is carrying more than 1 baby. Current standards are to deliver twin pregnancies at 38 weeks of gestation. A woman is having complications, such as: High blood pressure caused by pregnancy (preeclampsia). Bleeding. Infection. There are conditions affecting the baby's health, such as: Intrauterine growth restriction (IUGR), where the baby is not growing well. Having abnormal fetal heart rate patterns on the monitor (nonreassuring tracing). Having a lack of fluid that surrounds the baby (oligohydramnios). Having placental issues. Fluid that surrounds the baby (amniotic fluid) is leaking. There are many other safety reasons that a pregnancy may need to be induced early. REASONS AGAINST ELECTIVE BIRTH Sometimes early elective birth is not the best choice. It may not be a good idea if:  An early birth is just more convenient. You want the baby to be born on a certain date, like a holiday. You are more likely to need a cesarean delivery before 39 weeks of gestation. A cesarean delivery can lead to other problems. Problems include infection, bleeding, and not having enough iron in your blood (  anemia), which can cause weakness.  Babies born early (34-37 weeks of gestation): May need special care at the hospital or in a special care nursery. Are at a greater risk for: Brain damage. Feeding problems. Breathing problems. Slow physical and mental development. May need special care in a neonatal intensive care unit (NICU), but this is rare. The length of  the baby's stay in the hospital will depend on how quickly he or she progresses to a safe level of care. Are at a greater risk for: Infection. Bleeding inside the brain. Dying during their first year of life. REDUCING EARLY ELECTIVE BIRTHS Carrying a baby longer than 42 weeks of gestation is not good for the baby or the mother. A full-term pregnancy is best for baby and mother. Anything earlier can be risky for you and your baby. Remember: An early elective birth may lead to a cesarean delivery. This can lead to other problems for the mother and baby. An early elective birth can result in developmental problems for your child. A baby's brain continues to develop while in the uterus. A baby's body continues to develop. The baby will be better able to breathe and eat when he or she is born near the due date. A baby who stays in the uterus longer responds better. The baby will also bond better with you. Document Released: 07/20/2011 Document Revised: 03/24/2014 Document Reviewed: 06/06/2013 [facility] Patient Information 2015 [place], [place]. This information is not intended to replace advice given to you by your health care provider. Make sure you discuss any questions you have with your health care provider.  Early Elective Birth Early elective birth refers to making a choice to have a baby before the time the baby is due. The length of a pregnancy is 9 months, or 40 weeks, starting from the beginning of a woman's last menstrual period. Most women naturally go into labor around 40 weeks of gestation. A full-term pregnancy is considered between 37 weeks and 42 weeks of gestation. Currently, early elective births can take place sometime after 39 weeks of gestation. Most health care providers practice within the guidelines of delivering a baby no later than 42 weeks of gestation and no earlier than 39 weeks of gestation. There are exceptions to this time interval, and the risks involved to the mother and  baby need to be considered in those cases.  Induction of labor refers to the use of medicines to bring aboutcontractions. Labor is when the cervix starts to widen (dilate). Active labor is when there are contractions and the cervix has dilated to at least 4 cm. Oftentimes, the earlier a mother is in her pregnancy, the longer it takes to get induced. When the cervix is ready (dilated and soft), an induction may take less than a day. However, when a cervix is far away from being ready (long, closed, and firm), it may take days in a hospital for labor to start.  Currently, 39 weeks of gestation is considered the earliest a health care providershould start the induction process. This is because the longer the baby stays inside the uterus, the lower the risks are to both the baby and mother. However, sometimes there are very good reasons for a pregnancy to be induced before 39 weeks of gestation. These exceptions are specific to each individual pregnancy and need to be considered on a case-by-case basis. A good reason to induce one pregnancy may not be a good reason for another pregnancy.  REASONS FOR ELECTIVE BIRTH It may  be safer to induce labor before 39 weeks of gestation if:  A woman is carrying more than 1 baby. Current standards are to deliver twin pregnancies at 38 weeks of gestation. A woman is having complications, such as: High blood pressure caused by pregnancy (preeclampsia). Bleeding. Infection. There are conditions affecting the baby's health, such as: Intrauterine growth restriction (IUGR), where the baby is not growing well. Having abnormal fetal heart rate patterns on the monitor (nonreassuring tracing). Having a lack of fluid that surrounds the baby (oligohydramnios). Having placental issues. Fluid that surrounds the baby (amniotic fluid) is leaking. There are many other safety reasons that a pregnancy may need to be induced early. REASONS AGAINST ELECTIVE BIRTH Sometimes early  elective birth is not the best choice. It may not be a good idea if:  An early birth is just more convenient. You want the baby to be born on a certain date, like a holiday. You are more likely to need a cesarean delivery before 39 weeks of gestation. A cesarean delivery can lead to other problems. Problems include infection, bleeding, and not having enough iron in your blood (anemia), which can cause weakness.  Babies born early (34-37 weeks of gestation): May need special care at the hospital or in a special care nursery. Are at a greater risk for: Brain damage. Feeding problems. Breathing problems. Slow physical and mental development. May need special care in a neonatal intensive care unit (NICU), but this is rare. The length of the baby's stay in the hospital will depend on how quickly he or she progresses to a safe level of care. Are at a greater risk for: Infection. Bleeding inside the brain. Dying during their first year of life. REDUCING EARLY ELECTIVE BIRTHS Carrying a baby longer than 42 weeks of gestation is not good for the baby or the mother. A full-term pregnancy is best for baby and mother. Anything earlier can be risky for you and your baby. Remember: An early elective birth may lead to a cesarean delivery. This can lead to other problems for the mother and baby. An early elective birth can result in developmental problems for your child. A baby's brain continues to develop while in the uterus. A baby's body continues to develop. The baby will be better able to breathe and eat when he or she is born near the due date. A baby who stays in the uterus longer responds better. The baby will also bond better with you. Document Released: 07/20/2011 Document Revised: 03/24/2014 Document Reviewed: 06/06/2013 [facility] Patient Information 2015 [place], [place]. This information is not intended to replace advice given to you by your health care provider. Make sure you discuss any  questions you have with your health care provider.   Do not drink alcohol.  Do not smoke.  Do not have drinks with caffeine.  Lie on your left side when resting.  Your health care provider may ask you to take one low-dose aspirin (81mg ) each day. GET HELP RIGHT AWAY IF:  You have bad belly (abdominal) pain.  You have sudden puffiness (swelling) in the hands, ankles, or face.  You gain 4 pounds (1.8 kilograms) or more in 1 week.  You throw up (vomit) repeatedly.  You have bleeding from the vagina.  You do not feel the baby moving as much.  You have a headache.  You have blurred or double vision.  You have muscle twitching or spasms.  You have shortness of breath.  You have blue fingernails and lips.  You have blood in your pee (urine). MAKE SURE YOU:  Understand these instructions.  Will watch your condition.  Will get help right away if you are not doing well or get worse. Document Released: 12/10/2010 Document Revised: 03/24/2014 Document Reviewed: 06/06/2013 [facility] Patient Information 2015 [place], [place]. This information is not intended to replace advice given to you by your health care provider. Make sure you discuss any questions you have with your health care provider.  Preeclampsia and Eclampsia Preeclampsia is a serious condition that develops only during pregnancy. It is also called toxemia of pregnancy. This condition causes high blood pressure along with other symptoms, such as swelling and headaches. These may develop as the condition gets worse. Preeclampsia may occur 20 weeks or later into your pregnancy.  Diagnosing and treating preeclampsia early is very important. If not treated early, it can cause serious problems for you and your baby. One problem it can lead to is eclampsia, which is a condition that causes muscle jerking or shaking (convulsions) in the mother. Delivering your baby is the best treatment for preeclampsia or eclampsia.  RISK  FACTORS The cause of preeclampsia is not known. You may be more likely to develop preeclampsia if you have certain risk factors. These include:   Being pregnant for the first time.  Having preeclampsia in a past pregnancy.  Having a family history of preeclampsia.  Having high blood pressure.  Being pregnant with twins or triplets.  Being 8735 or older.  Being African American.  Having kidney disease or diabetes.  Having medical conditions such as lupus or blood diseases.  Being very overweight (obese). SIGNS AND SYMPTOMS  The earliest signs of preeclampsia are:  High blood pressure.  Increased protein in your urine. Your health care provider will check for this at every prenatal visit. Other symptoms that can develop include:   Severe headaches.  Sudden weight gain.  Swelling of your hands, face, legs, and feet.  Feeling sick to your stomach (nauseous) and throwing up (vomiting).  Vision problems (blurred or double vision).  Numbness in your face, arms, legs, and feet.  Dizziness.  Slurred speech.  Sensitivity to bright lights.  Abdominal pain. DIAGNOSIS  There are no screening tests for preeclampsia. Your health care provider will ask you about symptoms and check for signs of preeclampsia during your prenatal visits. You may also have tests, including:  Urine testing.  Blood testing.  Checking your baby's heart rate.  Checking the health of your baby and your placenta using images created with sound waves (ultrasound). TREATMENT  You can work out the best treatment approach together with your health care provider. It is very important to keep all prenatal appointments. If you have an increased risk of preeclampsia, you may need more frequent prenatal exams.  Your health care provider may prescribe bed rest.  You may have to eat as little salt as possible.  You may need to take medicine to lower your blood pressure if the condition does not respond to  more conservative measures.  You may need to stay in the hospital if your condition is severe. There, treatment will focus on controlling your blood pressure and fluid retention. You may also need to take medicine to prevent seizures.  If the condition gets worse, your baby may need to be delivered early to protect you and the baby. You may have your labor started with medicine (be induced), or you may have a cesarean delivery.  Preeclampsia usually goes away after the  baby is born. HOME CARE INSTRUCTIONS   Only take over-the-counter or prescription medicines as directed by your health care provider.  Lie on your left side while resting. This keeps pressure off your baby.  Elevate your feet while resting.  Get regular exercise. Ask your health care provider what type of exercise is safe for you.  Avoid caffeine and alcohol.  Do not smoke.  Drink 6-8 glasses of water every day.  Eat a balanced diet that is low in salt. Do not add salt to your food.  Avoid stressful situations as much as possible.  Get plenty of rest and sleep.  Keep all prenatal appointments and tests as scheduled. SEEK MEDICAL CARE IF:  You are gaining more weight than expected.  You have any headaches, abdominal pain, or nausea.  You are bruising more than usual.  You feel dizzy or light-headed. SEEK IMMEDIATE MEDICAL CARE IF:   You develop sudden or severe swelling anywhere in your body. This usually happens in the legs.  You gain 5 lb (2.3 kg) or more in a week.  You have a severe headache, dizziness, problems with your vision, or confusion.  You have severe abdominal pain.  You have lasting nausea or vomiting.  You have a seizure.  You have trouble moving any part of your body.  You develop numbness in your body.  You have trouble speaking.  You have any abnormal bleeding.  You develop a stiff neck.  You pass out. MAKE SURE YOU:   Understand these instructions.  Will watch your  condition.  Will get help right away if you are not doing well or get worse. Document Released: 11/04/2000 Document Revised: 11/12/2013 Document Reviewed: 08/30/2013 [facility] Patient Information 2015 [place], [place]. This information is not intended to replace advice given to you by your health care provider. Make sure you discuss any questions you have with your health care provider.

## 2014-09-22 ENCOUNTER — Encounter (HOSPITAL_COMMUNITY): Payer: Self-pay | Admitting: *Deleted

## 2014-09-22 DIAGNOSIS — Z3A34 34 weeks gestation of pregnancy: Secondary | ICD-10-CM | POA: Insufficient documentation

## 2014-09-22 DIAGNOSIS — O1490 Unspecified pre-eclampsia, unspecified trimester: Secondary | ICD-10-CM | POA: Insufficient documentation

## 2014-09-23 ENCOUNTER — Inpatient Hospital Stay (HOSPITAL_COMMUNITY): Payer: BC Managed Care – PPO

## 2014-09-23 ENCOUNTER — Encounter (HOSPITAL_COMMUNITY): Payer: Self-pay | Admitting: General Practice

## 2014-09-23 ENCOUNTER — Inpatient Hospital Stay (HOSPITAL_COMMUNITY)
Admission: AD | Admit: 2014-09-23 | Discharge: 2014-09-23 | Disposition: A | Discharge status: Home / Self Care | Payer: BC Managed Care – PPO | Source: Ambulatory Visit | Attending: Obstetrics and Gynecology | Admitting: Obstetrics and Gynecology

## 2014-09-23 DIAGNOSIS — O1403 Mild to moderate pre-eclampsia, third trimester: Secondary | ICD-10-CM

## 2014-09-23 DIAGNOSIS — O368130 Decreased fetal movements, third trimester, not applicable or unspecified: Secondary | ICD-10-CM | POA: Diagnosis present

## 2014-09-23 DIAGNOSIS — Z3A34 34 weeks gestation of pregnancy: Secondary | ICD-10-CM | POA: Diagnosis not present

## 2014-09-23 DIAGNOSIS — Z87891 Personal history of nicotine dependence: Secondary | ICD-10-CM | POA: Insufficient documentation

## 2014-09-23 LAB — CBC
HEMATOCRIT: 36.3 % (ref 36.0–46.0)
Hemoglobin: 12.5 g/dL (ref 12.0–15.0)
MCH: 30.7 pg (ref 26.0–34.0)
MCHC: 34.4 g/dL (ref 30.0–36.0)
MCV: 89.2 fL (ref 78.0–100.0)
PLATELETS: 209 10*3/uL (ref 150–400)
RBC: 4.07 MIL/uL (ref 3.87–5.11)
RDW: 13.3 % (ref 11.5–15.5)
WBC: 11.4 10*3/uL — AB (ref 4.0–10.5)

## 2014-09-23 LAB — COMPREHENSIVE METABOLIC PANEL
ALT: 25 U/L (ref 0–35)
ANION GAP: 12 (ref 5–15)
AST: 24 U/L (ref 0–37)
Albumin: 2.3 g/dL — ABNORMAL LOW (ref 3.5–5.2)
Alkaline Phosphatase: 119 U/L — ABNORMAL HIGH (ref 39–117)
BILIRUBIN TOTAL: 0.2 mg/dL — AB (ref 0.3–1.2)
BUN: 11 mg/dL (ref 6–23)
CO2: 21 meq/L (ref 19–32)
CREATININE: 0.58 mg/dL (ref 0.50–1.10)
Calcium: 8.5 mg/dL (ref 8.4–10.5)
Chloride: 102 mEq/L (ref 96–112)
Glucose, Bld: 110 mg/dL — ABNORMAL HIGH (ref 70–99)
Potassium: 4 mEq/L (ref 3.7–5.3)
Sodium: 135 mEq/L — ABNORMAL LOW (ref 137–147)
Total Protein: 6.1 g/dL (ref 6.0–8.3)

## 2014-09-23 NOTE — MAU Provider Note (Signed)
Chief Complaint:  Proteinuria and Decreased Fetal Movement   First Provider Initiated Contact with Patient 09/23/14 1330      HPI: Kieth BrightlyLisbeth Recktenwald is a 28 y.o. G3P0020 at [id number] who presents to maternity admissions For further evaluation following an office visit where she had reported decreased fetal movement and had a nonreactive NST. He is being followed closely for preeclampsia which has not had severe features to date. Denies contractions, leakage of fluid or vaginal bleeding. Good fetal movement.   Pregnancy Course: Notable for bipolar and preE  Past Medical History: Past Medical History  Diagnosis Date  . Anxiety   . Depression   . Bipolar 1 disorder   . Hypertension     Past obstetric history: OB History  Gravida Para Term Preterm AB SAB TAB Ectopic Multiple Living  3    2  2        # Outcome Date GA Lbr Len/2nd Weight Sex Delivery Anes PTL Lv  3 Current           2 TAB           1 TAB               Past Surgical History: Past Surgical History  Procedure Laterality Date  . Tonsillectomy    . Dilation and curettage of uterus    . Wisdom tooth extraction       Family History: Family History  Problem Relation Age of Onset  . Depression Father   . Anxiety disorder Father   . ADD / ADHD Brother     Social History: History  Substance Use Topics  . Smoking status: Former Smoker -- 0.50 packs/day for 10 years    Types: Cigarettes  . Smokeless tobacco: Never Used  . Alcohol Use: Yes     Comment: drinks- 3-4 beers    Allergies: No Known Allergies  Meds:  Prescriptions prior to admission  Medication Sig Dispense Refill Last Dose  . ARIPiprazole (ABILIFY) 5 MG tablet Take 1 tablet (5 mg total) by mouth daily. 30 tablet 1 09/22/2014 at Unknown time  . lamoTRIgine (LAMICTAL) 25 MG tablet Take 1 tablet (25 mg total) by mouth daily. 30 tablet 1 09/22/2014 at Unknown time  . Prenatal Vit-Fe Fumarate-FA (PRENATAL MULTIVITAMIN) TABS tablet Take 1 tablet by mouth daily  at 12 noon.   09/22/2014 at Unknown time    ROS: Pertinent findings in history of present illness.  Physical Exam  Blood pressure 135/87, pulse 97, temperature 98.6 F (37 C), temperature source Oral, resp. rate 18, weight 105.416 kg (232 lb 6.4 oz), last menstrual period 12/22/2013. GENERAL: Well-developed, well-nourished female in no acute distress.  HEENT: normocephalic HEART: normal rate RESP: normal effort ABDOMEN: Soft, non-tender, gravid appropriate for gestational age EXTREMITIES: Nontender, no edema NEURO: alert and oriented SPECULUM EXAM: NEFG, physiologic discharge, no blood, cervix clean    FHT:  Baseline 130, moderate variability, accelerations present, isolated variable deceleration x 1 90-100 1- 1/2 min Contractions: sl UI   Labs: Results for orders placed or performed during the hospital encounter of 09/23/14 (from the past 24 hour(s))  CBC     Status: Abnormal   Collection Time: 09/23/14  2:04 PM  Result Value Ref Range   WBC 11.4 (H) 4.0 - 10.5 K/uL   RBC 4.07 3.87 - 5.11 MIL/uL   Hemoglobin 12.5 12.0 - 15.0 g/dL   HCT 16.136.3 09.636.0 - 04.546.0 %   MCV 89.2 78.0 - 100.0 fL   MCH 30.7  26.0 - 34.0 pg   MCHC 34.4 30.0 - 36.0 g/dL   RDW 16.113.3 09.611.5 - 04.515.5 %   Platelets 209 150 - 400 K/uL  Comprehensive metabolic panel     Status: Abnormal   Collection Time: 09/23/14  2:04 PM  Result Value Ref Range   Sodium 135 (L) 137 - 147 mEq/L   Potassium 4.0 3.7 - 5.3 mEq/L   Chloride 102 96 - 112 mEq/L   CO2 21 19 - 32 mEq/L   Glucose, Bld 110 (H) 70 - 99 mg/dL   BUN 11 6 - 23 mg/dL   Creatinine, Ser 4.090.58 0.50 - 1.10 mg/dL   Calcium 8.5 8.4 - 81.110.5 mg/dL   Total Protein 6.1 6.0 - 8.3 g/dL   Albumin 2.3 (L) 3.5 - 5.2 g/dL   AST 24 0 - 37 U/L   ALT 25 0 - 35 U/L   Alkaline Phosphatase 119 (H) 39 - 117 U/L   Total Bilirubin 0.2 (L) 0.3 - 1.2 mg/dL   GFR calc non Af Amer >90 >90 mL/min   GFR calc Af Amer >90 >90 mL/min   Anion gap 12 5 - 15  09/19/14: 1178mg  pro/24  hr  Imaging:  Fax: [phone]  [place] Ob Comp + 14 Wk  09/22/2014   OBSTETRICAL ULTRASOUND: This exam was performed within a [facility]. The OB US report was generated in the AS system, and faxed to the ordering physician.   This report is available in the [facility] [employer] PACS. See the AS Obstetric US report via the Image Link.  [place] Ua Cord Doppler  09/22/2014   OBSTETRICAL ULTRASOUND: This exam was performed within a [facility]. The OB US report was generated in the AS system, and faxed to the ordering physician.   This report is available in the [facility] [employer] PACS. See the AS Obstetric US report via the Image Link.  MAU Course: Prelim US: BPP 8/8, AFI 18.8 Perceiving good FM while here C/W Dr. Henderson CloudHorvath Assessment: 1. Mild preeclampsia, third trimester   Reassuring fetal parameters  Plan: Discharge home with preE precautions Labor precautions and fetal kick counts    Medication List    TAKE these medications        ARIPiprazole 5 MG tablet  Commonly known as:  ABILIFY  Take 1 tablet (5 mg total) by mouth daily.     lamoTRIgine 25 MG tablet  Commonly known as:  LAMICTAL  Take 1 tablet (25 mg total) by mouth daily.     prenatal multivitamin Tabs tablet  Take 1 tablet by mouth daily at 12 noon.        Follow-up Information    Follow up with Naija Troost A, MD On 09/26/2014.   Specialty:  Obstetrics and Gynecology   Why:  Keep your scheduled prenatal appointment   Contact information:   [address] [place] [id number] [phone]       Danae Orleanseirdre C Poe, CNM 09/23/2014 1:58 PM

## 2014-09-23 NOTE — MAU Note (Signed)
Pt was sent from the office for decreased fetal movement and protein in urine

## 2014-09-23 NOTE — Discharge Instructions (Signed)

## 2014-09-29 ENCOUNTER — Other Ambulatory Visit: Payer: Self-pay

## 2014-09-29 LAB — OB RESULTS CONSOLE GBS: GBS: POSITIVE

## 2014-10-08 ENCOUNTER — Telehealth (HOSPITAL_COMMUNITY): Payer: Self-pay | Admitting: *Deleted

## 2014-10-08 ENCOUNTER — Encounter (HOSPITAL_COMMUNITY): Payer: Self-pay | Admitting: *Deleted

## 2014-10-08 NOTE — Telephone Encounter (Signed)
Preadmission screen  

## 2014-10-09 ENCOUNTER — Encounter (HOSPITAL_COMMUNITY): Payer: Self-pay

## 2014-10-09 ENCOUNTER — Inpatient Hospital Stay (HOSPITAL_COMMUNITY)
Admission: RE | Admit: 2014-10-09 | Discharge: 2014-10-12 | DRG: 774 | Disposition: A | Discharge status: Home / Self Care | Payer: BC Managed Care – PPO | Source: Ambulatory Visit | Attending: Obstetrics and Gynecology | Admitting: Obstetrics and Gynecology

## 2014-10-09 ENCOUNTER — Telehealth: Payer: Self-pay

## 2014-10-09 DIAGNOSIS — O99343 Other mental disorders complicating pregnancy, third trimester: Secondary | ICD-10-CM | POA: Diagnosis present

## 2014-10-09 DIAGNOSIS — F319 Bipolar disorder, unspecified: Secondary | ICD-10-CM | POA: Diagnosis present

## 2014-10-09 DIAGNOSIS — Z3A36 36 weeks gestation of pregnancy: Secondary | ICD-10-CM | POA: Diagnosis present

## 2014-10-09 DIAGNOSIS — Z87891 Personal history of nicotine dependence: Secondary | ICD-10-CM

## 2014-10-09 DIAGNOSIS — O1403 Mild to moderate pre-eclampsia, third trimester: Secondary | ICD-10-CM | POA: Diagnosis present

## 2014-10-09 DIAGNOSIS — O99824 Streptococcus B carrier state complicating childbirth: Secondary | ICD-10-CM | POA: Diagnosis present

## 2014-10-09 DIAGNOSIS — Z3480 Encounter for supervision of other normal pregnancy, unspecified trimester: Secondary | ICD-10-CM

## 2014-10-09 LAB — CBC
HCT: 37.3 % (ref 36.0–46.0)
Hemoglobin: 12.5 g/dL (ref 12.0–15.0)
MCH: 30.3 pg (ref 26.0–34.0)
MCHC: 33.5 g/dL (ref 30.0–36.0)
MCV: 90.5 fL (ref 78.0–100.0)
PLATELETS: 218 10*3/uL (ref 150–400)
RBC: 4.12 MIL/uL (ref 3.87–5.11)
RDW: 14.1 % (ref 11.5–15.5)
WBC: 10 10*3/uL (ref 4.0–10.5)

## 2014-10-09 LAB — COMPREHENSIVE METABOLIC PANEL
ALBUMIN: 2 g/dL — AB (ref 3.5–5.2)
ALT: 28 U/L (ref 0–35)
ANION GAP: 15 (ref 5–15)
AST: 36 U/L (ref 0–37)
Alkaline Phosphatase: 160 U/L — ABNORMAL HIGH (ref 39–117)
BUN: 17 mg/dL (ref 6–23)
CO2: 16 mEq/L — ABNORMAL LOW (ref 19–32)
Calcium: 8.2 mg/dL — ABNORMAL LOW (ref 8.4–10.5)
Chloride: 104 mEq/L (ref 96–112)
Creatinine, Ser: 0.77 mg/dL (ref 0.50–1.10)
GFR calc non Af Amer: >90 mL/min (ref >90)
GLUCOSE: 138 mg/dL — AB (ref 70–99)
Potassium: 4.4 mEq/L (ref 3.7–5.3)
Sodium: 135 mEq/L — ABNORMAL LOW (ref 137–147)
TOTAL PROTEIN: 5.5 g/dL — AB (ref 6.0–8.3)
Total Bilirubin: <0.2 mg/dL — ABNORMAL LOW (ref 0.3–1.2)

## 2014-10-09 LAB — URIC ACID: Uric Acid, Serum: 6.5 mg/dL (ref 2.4–7.0)

## 2014-10-09 LAB — LACTATE DEHYDROGENASE: LDH: 326 U/L — AB (ref 94–250)

## 2014-10-09 MED ORDER — LACTATED RINGERS IV SOLN
INTRAVENOUS | Status: DC
  Administered 2014-10-09 – 2014-10-10 (×5): via INTRAVENOUS

## 2014-10-09 MED ORDER — ONDANSETRON HCL 4 MG/2ML IJ SOLN
4.0000 mg | Freq: Four times a day (QID) | INTRAMUSCULAR | Status: DC | PRN
  Administered 2014-10-10: 4 mg via INTRAVENOUS
  Filled 2014-10-09: qty 2

## 2014-10-09 MED ORDER — EPHEDRINE 5 MG/ML INJ
10.0000 mg | INTRAVENOUS | Status: DC | PRN
  Filled 2014-10-09: qty 2

## 2014-10-09 MED ORDER — ZOLPIDEM TARTRATE 5 MG PO TABS
5.0000 mg | ORAL_TABLET | Freq: Every evening | ORAL | Status: DC | PRN
  Administered 2014-10-09: 5 mg via ORAL
  Filled 2014-10-09: qty 1

## 2014-10-09 MED ORDER — MISOPROSTOL 25 MCG QUARTER TABLET
25.0000 ug | ORAL_TABLET | ORAL | Status: DC | PRN
  Administered 2014-10-09 – 2014-10-10 (×3): 25 ug via VAGINAL
  Filled 2014-10-09: qty 0.25
  Filled 2014-10-09: qty 1
  Filled 2014-10-09 (×2): qty 0.25

## 2014-10-09 MED ORDER — FENTANYL 2.5 MCG/ML BUPIVACAINE 1/10 % EPIDURAL INFUSION (WH - ANES)
14.0000 mL/h | INTRAMUSCULAR | Status: DC | PRN
  Administered 2014-10-10 (×2): 14 mL/h via EPIDURAL
  Filled 2014-10-09 (×2): qty 125

## 2014-10-09 MED ORDER — CITRIC ACID-SODIUM CITRATE 334-500 MG/5ML PO SOLN: 30.0000 mL | ORAL | Status: DC | PRN

## 2014-10-09 MED ORDER — PHENYLEPHRINE 40 MCG/ML (10ML) SYRINGE FOR IV PUSH (FOR BLOOD PRESSURE SUPPORT)
80.0000 ug | PREFILLED_SYRINGE | INTRAVENOUS | Status: DC | PRN
  Filled 2014-10-09: qty 2
  Filled 2014-10-09: qty 10

## 2014-10-09 MED ORDER — FLEET ENEMA 7-19 GM/118ML RE ENEM: 1.0000 | ENEMA | Freq: Every day | RECTAL | Status: DC | PRN

## 2014-10-09 MED ORDER — ARIPIPRAZOLE 5 MG PO TABS
5.0000 mg | ORAL_TABLET | Freq: Once | ORAL | Status: AC
  Administered 2014-10-09: 5 mg via ORAL
  Filled 2014-10-09: qty 1

## 2014-10-09 MED ORDER — PENICILLIN G POTASSIUM 5000000 UNITS IJ SOLR
5.0000 10*6.[IU] | Freq: Once | INTRAVENOUS | Status: DC
  Filled 2014-10-09: qty 5

## 2014-10-09 MED ORDER — PHENYLEPHRINE 40 MCG/ML (10ML) SYRINGE FOR IV PUSH (FOR BLOOD PRESSURE SUPPORT)
80.0000 ug | PREFILLED_SYRINGE | INTRAVENOUS | Status: DC | PRN
  Filled 2014-10-09: qty 2

## 2014-10-09 MED ORDER — TERBUTALINE SULFATE 1 MG/ML IJ SOLN: 0.2500 mg | Freq: Once | INTRAMUSCULAR | Status: AC | PRN

## 2014-10-09 MED ORDER — DEXTROSE 5 % IV SOLN
2.5000 10*6.[IU] | INTRAVENOUS | Status: DC
  Filled 2014-10-09 (×11): qty 2.5

## 2014-10-09 MED ORDER — OXYTOCIN 40 UNITS IN LACTATED RINGERS INFUSION - SIMPLE MED: 62.5000 mL/h | INTRAVENOUS | Status: DC

## 2014-10-09 MED ORDER — LIDOCAINE HCL (PF) 1 % IJ SOLN
30.0000 mL | INTRAMUSCULAR | Status: DC | PRN
  Filled 2014-10-09: qty 30

## 2014-10-09 MED ORDER — OXYCODONE-ACETAMINOPHEN 5-325 MG PO TABS: 1.0000 | ORAL_TABLET | ORAL | Status: DC | PRN

## 2014-10-09 MED ORDER — LACTATED RINGERS IV SOLN
500.0000 mL | Freq: Once | INTRAVENOUS | Status: AC
  Administered 2014-10-10: 500 mL via INTRAVENOUS

## 2014-10-09 MED ORDER — OXYTOCIN BOLUS FROM INFUSION
500.0000 mL | INTRAVENOUS | Status: DC
  Administered 2014-10-10: 500 mL via INTRAVENOUS

## 2014-10-09 MED ORDER — LACTATED RINGERS IV SOLN: 500.0000 mL | INTRAVENOUS | Status: DC | PRN

## 2014-10-09 MED ORDER — OXYCODONE-ACETAMINOPHEN 5-325 MG PO TABS: 2.0000 | ORAL_TABLET | ORAL | Status: DC | PRN

## 2014-10-09 MED ORDER — DIPHENHYDRAMINE HCL 50 MG/ML IJ SOLN: 12.5000 mg | INTRAMUSCULAR | Status: DC | PRN

## 2014-10-09 MED ORDER — LAMOTRIGINE 25 MG PO TABS
25.0000 mg | ORAL_TABLET | Freq: Every day | ORAL | Status: DC
  Administered 2014-10-09 – 2014-10-10 (×2): 25 mg via ORAL
  Filled 2014-10-09 (×3): qty 1

## 2014-10-09 MED ORDER — ACETAMINOPHEN 325 MG PO TABS
650.0000 mg | ORAL_TABLET | ORAL | Status: DC | PRN
  Administered 2014-10-10: 650 mg via ORAL
  Filled 2014-10-09: qty 2

## 2014-10-09 MED ORDER — BUTORPHANOL TARTRATE 1 MG/ML IJ SOLN
1.0000 mg | INTRAMUSCULAR | Status: DC | PRN
  Administered 2014-10-09 – 2014-10-10 (×4): 1 mg via INTRAVENOUS
  Filled 2014-10-09 (×4): qty 1

## 2014-10-09 NOTE — Progress Notes (Signed)
Spoke with Dr. Henderson CloudHorvath- Orders to continue Abilify 5mg  po daily and Lamictal 25mg  po daily- first dose tonight 2200

## 2014-10-09 NOTE — Progress Notes (Signed)
I called Dr. Henderson CloudHorvath to notify of UC,s irregular, FHR. Orders for Cytotec for inudction, Ambien if pt would like, stadol PRN 1 hour pain and epidural if requested.

## 2014-10-09 NOTE — Telephone Encounter (Signed)
Per previous notes pt does need to come in for a follow up. Pt advised. She will be in on Friday 11/27.

## 2014-10-09 NOTE — H&P (Signed)
28 y.o. [id number]  G3P0020 comes in for induction at term for preeclampsia- mild.  Pt had had mild proteinuria (24 hour urine 187 mg) but no other s/s severe.  Otherwise has good fetal movement and no bleeding.  Past Medical History  Diagnosis Date  . Anxiety   . Depression   . Bipolar 1 disorder   . Hypertension     Past Surgical History  Procedure Laterality Date  . Tonsillectomy    . Dilation and curettage of uterus    . Wisdom tooth extraction      OB History  Gravida Para Term Preterm AB SAB TAB Ectopic Multiple Living  3    2  2        # Outcome Date GA Lbr Len/2nd Weight Sex Delivery Anes PTL Lv  3 Current           2 TAB           1 TAB               History   Social History  . Marital Status: Single    Spouse Name: N/A    Number of Children: N/A  . Years of Education: N/A   Occupational History  . Not on file.   Social History Main Topics  . Smoking status: Former Smoker -- 0.50 packs/day for 10 years    Types: Cigarettes    Quit date: 03/08/2014  . Smokeless tobacco: Never Used  . Alcohol Use: Yes     Comment: drinks- 3-4 beers  . Drug Use: No  . Sexual Activity:    Partners: Male     Comment: number of sex partners in the last 12 months  1   Other Topics Concern  . Not on file   Social History Narrative   Review of patient's allergies indicates no known allergies.    Prenatal Transfer Tool  Maternal Diabetes: No Genetic Screening: Normal Maternal Ultrasounds/Referrals: Normal Fetal Ultrasounds or other Referrals:  Referred to Materal Fetal Medicine  for isolated elevated AFP- normal anatomy. Maternal Substance Abuse:  No Significant Maternal Medications:  Meds include: Other:  lamatogine and abilify Significant Maternal Lab Results: None  Other PNC: Antenatal testing has all been reassuring.  Pt was admittted early third trimester for psychiatric reasons- she has been stable since.  There were no vitals filed for this visit.  Lungs/Cor:   NAD Abdomen:  soft, gravid Ex:  no cords, erythema SVE:  In office cl/th/high FHTs:  120s, good STV, NST R Toco:  qocc   A/P   Term with mild preeclampsia. For induction.  GBS positive- PCN.   Ana Best A

## 2014-10-09 NOTE — Telephone Encounter (Signed)
Pt of Dr. Merla Richesoolittle requesting refill on amphetamine-dextroamphetamine (ADDERALL) 30 MG tablet, pt states that she does not know if she needs to RTC for this, but she is headed [facility] now to be induced, and would like to have this taken care of before the baby is here. Please advise pt.

## 2014-10-10 ENCOUNTER — Encounter (HOSPITAL_COMMUNITY): Payer: Self-pay

## 2014-10-10 ENCOUNTER — Inpatient Hospital Stay (HOSPITAL_COMMUNITY): Payer: BC Managed Care – PPO | Admitting: Anesthesiology

## 2014-10-10 LAB — CBC
HCT: 35.4 % — ABNORMAL LOW (ref 36.0–46.0)
HCT: 38.8 % (ref 36.0–46.0)
Hemoglobin: 12.1 g/dL (ref 12.0–15.0)
Hemoglobin: 13.3 g/dL (ref 12.0–15.0)
MCH: 31.1 pg (ref 26.0–34.0)
MCH: 30.9 pg (ref 26.0–34.0)
MCHC: 34.2 g/dL (ref 30.0–36.0)
MCHC: 34.3 g/dL (ref 30.0–36.0)
MCV: 90.7 fL (ref 78.0–100.0)
MCV: 90.5 fL (ref 78.0–100.0)
PLATELETS: 188 10*3/uL (ref 150–400)
PLATELETS: 186 10*3/uL (ref 150–400)
RBC: 3.91 MIL/uL (ref 3.87–5.11)
RBC: 4.28 MIL/uL (ref 3.87–5.11)
RDW: 13.9 % (ref 11.5–15.5)
RDW: 13.8 % (ref 11.5–15.5)
WBC: 9.9 10*3/uL (ref 4.0–10.5)
WBC: 18.9 10*3/uL — AB (ref 4.0–10.5)

## 2014-10-10 LAB — RPR

## 2014-10-10 LAB — TYPE AND SCREEN
ABO/RH(D): A POS
ANTIBODY SCREEN: NEGATIVE

## 2014-10-10 LAB — ABO/RH: ABO/RH(D): A POS

## 2014-10-10 MED ORDER — OXYTOCIN 40 UNITS IN LACTATED RINGERS INFUSION - SIMPLE MED
INTRAVENOUS | Status: AC
  Administered 2014-10-10: 2 m[IU]/min via INTRAVENOUS
  Filled 2014-10-10: qty 1000

## 2014-10-10 MED ORDER — PENICILLIN G POTASSIUM 5000000 UNITS IJ SOLR
2.5000 10*6.[IU] | INTRAMUSCULAR | Status: DC
Start: 2014-10-10 — End: 2014-10-11
  Filled 2014-10-10 (×5): qty 2.5

## 2014-10-10 MED ORDER — LACTATED RINGERS IV SOLN: INTRAVENOUS | Status: DC

## 2014-10-10 MED ORDER — PENICILLIN G POTASSIUM 5000000 UNITS IJ SOLR
5.0000 10*6.[IU] | Freq: Once | INTRAVENOUS | Status: AC
  Administered 2014-10-10: 5 10*6.[IU] via INTRAVENOUS
  Filled 2014-10-10: qty 5

## 2014-10-10 MED ORDER — OXYTOCIN 40 UNITS IN LACTATED RINGERS INFUSION - SIMPLE MED
1.0000 m[IU]/min | INTRAVENOUS | Status: DC
  Administered 2014-10-10: 2 m[IU]/min via INTRAVENOUS

## 2014-10-10 MED ORDER — LIDOCAINE HCL (PF) 1 % IJ SOLN
INTRAMUSCULAR | Status: DC | PRN
  Administered 2014-10-10 (×2): 5 mL

## 2014-10-10 MED ORDER — LABETALOL HCL 5 MG/ML IV SOLN
20.0000 mg | Freq: Once | INTRAVENOUS | Status: AC
  Administered 2014-10-10: 20 mg via INTRAVENOUS
  Filled 2014-10-10: qty 4

## 2014-10-10 MED ORDER — ARIPIPRAZOLE 5 MG PO TABS
5.0000 mg | ORAL_TABLET | Freq: Every day | ORAL | Status: DC
  Administered 2014-10-10 – 2014-10-11 (×2): 5 mg via ORAL
  Filled 2014-10-10 (×3): qty 1

## 2014-10-10 NOTE — Progress Notes (Signed)
NST R.  Results for orders placed or performed during the hospital encounter of 10/09/14 (from the past 24 hour(s))  CBC     Status: None   Collection Time: 10/09/14  7:40 PM  Result Value Ref Range   WBC 10.0 4.0 - 10.5 K/uL   RBC 4.12 3.87 - 5.11 MIL/uL   Hemoglobin 12.5 12.0 - 15.0 g/dL   HCT 16.137.3 09.636.0 - 04.546.0 %   MCV 90.5 78.0 - 100.0 fL   MCH 30.3 26.0 - 34.0 pg   MCHC 33.5 30.0 - 36.0 g/dL   RDW 40.914.1 81.111.5 - 91.415.5 %   Platelets 218 150 - 400 K/uL  RPR     Status: None   Collection Time: 10/09/14  7:40 PM  Result Value Ref Range   RPR NON REAC NON REAC  Lactate dehydrogenase     Status: Abnormal   Collection Time: 10/09/14  7:40 PM  Result Value Ref Range   LDH 326 (H) 94 - 250 U/L  Uric acid     Status: None   Collection Time: 10/09/14  7:40 PM  Result Value Ref Range   Uric Acid, Serum 6.5 2.4 - 7.0 mg/dL  Comprehensive metabolic panel     Status: Abnormal   Collection Time: 10/09/14  7:40 PM  Result Value Ref Range   Sodium 135 (L) 137 - 147 mEq/L   Potassium 4.4 3.7 - 5.3 mEq/L   Chloride 104 96 - 112 mEq/L   CO2 16 (L) 19 - 32 mEq/L   Glucose, Bld 138 (H) 70 - 99 mg/dL   BUN 17 6 - 23 mg/dL   Creatinine, Ser 7.820.77 0.50 - 1.10 mg/dL   Calcium 8.2 (L) 8.4 - 10.5 mg/dL   Total Protein 5.5 (L) 6.0 - 8.3 g/dL   Albumin 2.0 (L) 3.5 - 5.2 g/dL   AST 36 0 - 37 U/L   ALT 28 0 - 35 U/L   Alkaline Phosphatase 160 (H) 39 - 117 U/L   Total Bilirubin <0.2 (L) 0.3 - 1.2 mg/dL   GFR calc non Af Amer >90 >90 mL/min   GFR calc Af Amer >90 >90 mL/min   Anion gap 15 5 - 15  Type and screen     Status: None   Collection Time: 10/09/14  7:40 PM  Result Value Ref Range   ABO/RH(D) A POS    Antibody Screen NEG    Sample Expiration 10/12/2014

## 2014-10-10 NOTE — Anesthesia Procedure Notes (Signed)
Epidural Patient location during procedure: OB Start time: 10/10/2014 12:20 PM  Staffing Anesthesiologist: Brayton CavesJACKSON, Pyper Olexa Performed by: anesthesiologist   Preanesthetic Checklist Completed: patient identified, site marked, surgical consent, pre-op evaluation, timeout performed, IV checked, risks and benefits discussed and monitors and equipment checked  Epidural Patient position: sitting Prep: site prepped and draped and DuraPrep Patient monitoring: continuous pulse ox and blood pressure Approach: midline Location: L3-L4 Injection technique: LOR air  Needle:  Needle type: Tuohy  Needle gauge: 17 G Needle length: 9 cm and 9 Needle insertion depth: 6 cm Catheter type: closed end flexible Catheter size: 19 Gauge Catheter at skin depth: 12 cm Test dose: negative  Assessment Events: blood not aspirated, injection not painful, no injection resistance, negative IV test and no paresthesia  Additional Notes Patient identified.  Risk benefits discussed including failed block, incomplete pain control, headache, nerve damage, paralysis, blood pressure changes, nausea, vomiting, reactions to medication both toxic or allergic, and postpartum back pain.  Patient expressed understanding and wished to proceed.  All questions were answered.  Sterile technique used throughout procedure and epidural site dressed with sterile barrier dressing. No paresthesia or other complications noted.The patient did not experience any signs of intravascular injection such as tinnitus or metallic taste in mouth nor signs of intrathecal spread such as rapid motor block. Please see nursing notes for vital signs.

## 2014-10-10 NOTE — Anesthesia Preprocedure Evaluation (Signed)
Anesthesia Evaluation  Patient identified by MRN, date of birth, ID band Patient awake    Reviewed: Allergy & Precautions, H&P , Patient's Chart, lab work & pertinent test results  Airway Mallampati: III  TM Distance: >3 FB Neck ROM: full    Dental   Pulmonary former smoker,  breath sounds clear to auscultation        Cardiovascular hypertension, Rhythm:regular Rate:Normal     Neuro/Psych PSYCHIATRIC DISORDERS    GI/Hepatic   Endo/Other  Morbid obesity  Renal/GU      Musculoskeletal   Abdominal   Peds  Hematology   Anesthesia Other Findings   Reproductive/Obstetrics (+) Pregnancy                             Anesthesia Physical Anesthesia Plan  ASA: III  Anesthesia Plan: Epidural   Post-op Pain Management:    Induction:   Airway Management Planned:   Additional Equipment:   Intra-op Plan:   Post-operative Plan:   Informed Consent: I have reviewed the patients History and Physical, chart, labs and discussed the procedure including the risks, benefits and alternatives for the proposed anesthesia with the patient or authorized representative who has indicated his/her understanding and acceptance.     Plan Discussed with:   Anesthesia Plan Comments:         Anesthesia Quick Evaluation

## 2014-10-11 DIAGNOSIS — Z3480 Encounter for supervision of other normal pregnancy, unspecified trimester: Secondary | ICD-10-CM

## 2014-10-11 LAB — CBC
HCT: 33.5 % — ABNORMAL LOW (ref 36.0–46.0)
HEMOGLOBIN: 11.4 g/dL — AB (ref 12.0–15.0)
MCH: 31 pg (ref 26.0–34.0)
MCHC: 34 g/dL (ref 30.0–36.0)
MCV: 91 fL (ref 78.0–100.0)
Platelets: 203 10*3/uL (ref 150–400)
RBC: 3.68 MIL/uL — ABNORMAL LOW (ref 3.87–5.11)
RDW: 13.9 % (ref 11.5–15.5)
WBC: 16.9 10*3/uL — ABNORMAL HIGH (ref 4.0–10.5)

## 2014-10-11 MED ORDER — LAMOTRIGINE 25 MG PO TABS
25.0000 mg | ORAL_TABLET | Freq: Every day | ORAL | Status: DC
  Administered 2014-10-11: 25 mg via ORAL
  Filled 2014-10-11: qty 1

## 2014-10-11 MED ORDER — ONDANSETRON HCL 4 MG/2ML IJ SOLN: 4.0000 mg | INTRAMUSCULAR | Status: DC | PRN

## 2014-10-11 MED ORDER — LABETALOL HCL 200 MG PO TABS
200.0000 mg | ORAL_TABLET | Freq: Three times a day (TID) | ORAL | Status: DC
  Administered 2014-10-11 – 2014-10-12 (×4): 200 mg via ORAL
  Filled 2014-10-11 (×4): qty 1

## 2014-10-11 MED ORDER — IBUPROFEN 600 MG PO TABS
600.0000 mg | ORAL_TABLET | Freq: Four times a day (QID) | ORAL | Status: DC
  Administered 2014-10-11 – 2014-10-12 (×7): 600 mg via ORAL
  Filled 2014-10-11 (×7): qty 1

## 2014-10-11 MED ORDER — OXYCODONE-ACETAMINOPHEN 5-325 MG PO TABS
2.0000 | ORAL_TABLET | ORAL | Status: DC | PRN
  Administered 2014-10-11 – 2014-10-12 (×4): 2 via ORAL
  Filled 2014-10-11 (×4): qty 2

## 2014-10-11 MED ORDER — MEASLES, MUMPS & RUBELLA VAC ~~LOC~~ INJ
0.5000 mL | INJECTION | Freq: Once | SUBCUTANEOUS | Status: DC
  Filled 2014-10-11: qty 0.5

## 2014-10-11 MED ORDER — SIMETHICONE 80 MG PO CHEW: 80.0000 mg | CHEWABLE_TABLET | ORAL | Status: DC | PRN

## 2014-10-11 MED ORDER — TETANUS-DIPHTH-ACELL PERTUSSIS 5-2.5-18.5 LF-MCG/0.5 IM SUSP: 0.5000 mL | Freq: Once | INTRAMUSCULAR | Status: DC

## 2014-10-11 MED ORDER — ONDANSETRON HCL 4 MG PO TABS: 4.0000 mg | ORAL_TABLET | ORAL | Status: DC | PRN

## 2014-10-11 MED ORDER — SENNOSIDES-DOCUSATE SODIUM 8.6-50 MG PO TABS
2.0000 | ORAL_TABLET | ORAL | Status: DC
  Administered 2014-10-11 (×2): 2 via ORAL
  Filled 2014-10-11 (×2): qty 2

## 2014-10-11 MED ORDER — BENZOCAINE-MENTHOL 20-0.5 % EX AERO: 1.0000 "application " | INHALATION_SPRAY | CUTANEOUS | Status: DC | PRN

## 2014-10-11 MED ORDER — OXYCODONE-ACETAMINOPHEN 5-325 MG PO TABS
1.0000 | ORAL_TABLET | ORAL | Status: DC | PRN
  Administered 2014-10-11: 1 via ORAL
  Filled 2014-10-11: qty 1

## 2014-10-11 MED ORDER — WITCH HAZEL-GLYCERIN EX PADS: 1.0000 "application " | MEDICATED_PAD | CUTANEOUS | Status: DC | PRN

## 2014-10-11 MED ORDER — DIBUCAINE 1 % RE OINT: 1.0000 "application " | TOPICAL_OINTMENT | RECTAL | Status: DC | PRN

## 2014-10-11 MED ORDER — ZOLPIDEM TARTRATE 5 MG PO TABS
5.0000 mg | ORAL_TABLET | Freq: Every evening | ORAL | Status: DC | PRN
  Administered 2014-10-11 (×2): 5 mg via ORAL
  Filled 2014-10-11 (×2): qty 1

## 2014-10-11 NOTE — Progress Notes (Signed)

## 2014-10-11 NOTE — Progress Notes (Addendum)
PPD#1 Pt doing well. Has 2+ LE edema B/P consistently elevated. IMP/ HTN PLAN/ Will start Labetalol

## 2014-10-11 NOTE — Plan of Care (Signed)
Problem: Phase II Progression Outcomes Goal: Pain controlled on oral analgesia Outcome: Completed/Met Date Met:  10/11/14 Goal: Progress activity as tolerated unless otherwise ordered Outcome: Completed/Met Date Met:  10/11/14 Goal: Afebrile, VS remain stable Outcome: Completed/Met Date Met:  10/11/14 Goal: Incision intact & without signs/symptoms of infection Outcome: Not Applicable Date Met:  29/03/79 Goal: Rh isoimmunization per orders Outcome: Not Applicable Date Met:  55/83/16 Goal: Tolerating diet Outcome: Completed/Met Date Met:  10/11/14 Goal: Other Phase II Outcomes/Goals Outcome: Completed/Met Date Met:  10/11/14  Problem: Discharge Progression Outcomes Goal: Activity appropriate for discharge plan Outcome: Completed/Met Date Met:  10/11/14 Goal: Tolerating diet Outcome: Completed/Met Date Met:  10/11/14

## 2014-10-11 NOTE — Anesthesia Postprocedure Evaluation (Signed)
  Anesthesia Post-op Note  Anesthesia Post Note  Patient: Ana Best  Procedure(s) Performed: * No procedures listed *  Anesthesia type: Epidural  Patient location: Mother/Baby  Post pain: Pain level controlled  Post assessment: Post-op Vital signs reviewed  Last Vitals:  Filed Vitals:   10/11/14 0515  BP: 131/91  Pulse: 69  Temp: 36.6 C  Resp: 17    Post vital signs: Reviewed  Level of consciousness:alert  Complications: No apparent anesthesia complications

## 2014-10-11 NOTE — Plan of Care (Signed)
Problem: Consults Goal: Postpartum Patient Education (See Patient Education module for education specifics.) Outcome: Completed/Met Date Met:  10/11/14

## 2014-10-12 MED ORDER — FUROSEMIDE 20 MG PO TABS: 20.0000 mg | ORAL_TABLET | Freq: Every day | ORAL | Status: DC

## 2014-10-12 MED ORDER — LABETALOL HCL 200 MG PO TABS: 200.0000 mg | ORAL_TABLET | Freq: Two times a day (BID) | ORAL | Status: DC

## 2014-10-12 MED ORDER — OXYCODONE-ACETAMINOPHEN 5-325 MG PO TABS: 1.0000 | ORAL_TABLET | ORAL | Status: DC | PRN

## 2014-10-12 NOTE — Discharge Summary (Signed)
Obstetric Discharge Summary Reason for Admission: induction of labor Prenatal Procedures: NST and ultrasound Intrapartum Procedures: spontaneous vaginal delivery Postpartum Procedures: none Complications-Operative and Postpartum: 1st degree perineal laceration HEMOGLOBIN  Date Value Ref Range Status  10/11/2014 11.4* 12.0 - 15.0 g/dL Final   HCT  Date Value Ref Range Status  10/11/2014 33.5* 36.0 - 46.0 % Final    Physical Exam:  General: alert Lochia: appropriate Uterine Fundus: firm  Discharge Diagnoses: Preelampsia  Discharge Information: Date: 10/12/2014 Activity: pelvic rest Diet: routine Medications: PNV, Ibuprofen, Percocet and labetalol and lasix Condition: stable Instructions: refer to practice specific booklet Discharge to: home Follow-up Information    Follow up with Levi AlandANDERSON,MARK E, MD. Schedule an appointment as soon as possible for a visit in 2 weeks.   Specialty:  Obstetrics and Gynecology   Contact information:   [address] [place] [id number] [phone]       Newborn Data: Live born female  Birth Weight: 6 lb 1.6 oz (2767 g) APGAR: 8, 9  Home with mother.  ANDERSON,MARK E 10/12/2014, 11:35 AM

## 2014-10-12 NOTE — Progress Notes (Signed)
Clinical Social Work Department PSYCHOSOCIAL ASSESSMENT - MATERNAL/CHILD 10/12/2014  Patient:  Ana Best, Ana Best  Account Number:  [id number]  [place] Date:  10/09/2014  Ardine Eng Name:   Elbert Ewings    Clinical Social Worker:  Aryanna Shaver, LCSW   Date/Time:  10/12/2014 09:30 AM  Date Referred:  10/11/2014   Referral source  Central Nursery     Referred reason  Behavioral Health Issues   Other referral source:    I:  FAMILY / HOME ENVIRONMENT Child's legal guardian:  PARENT  Guardian - Name Guardian - Age Guardian - Address  Ana Best,Ana Best 28 [address]  [place], [place] [address]  Kellie Moor as above   Other household support members/support persons Other support:    II  PSYCHOSOCIAL DATA Information Source:    Occupational hygienist Employment:   Both parents are Emergency planning/management officer resources:  Multimedia programmer If [place]:   Other  Mother states that she  missed last [facility] appt. and will follow up on Monday.   Encouraged mother to apply for food stamps seeing that she does not have maternity benefits.   School / Grade:   Maternity Care Coordinator / Child Services Coordination / Early Interventions:  Cultural issues impacting care:    III  STRENGTHS Strengths  Supportive family/friends  Home prepared for Child (including basic supplies)  Adequate Resources   Strength comment:    IV  RISK FACTORS AND CURRENT PROBLEMS Current Problem:     Risk Factor & Current Problem Patient Issue Family Issue Risk Factor / Current Problem Comment  Mental Illness Y N Hx of bipolar disorder    V  SOCIAL WORK ASSESSMENT Acknowledged order for social work consult to assess mother's hx of mental illness.  Met with mother who was pleasant and receptive to social work intervention.   FOB arrived later on and participated in the discussion. Informed that they have been in a relationship for over 5 years and he was able to recall history that mother  had trouble remembering.   Mother states that she was diagnosed with bipolar in 2011, and had therapy for a while, but medication was very effective, and after a while therapy was no longer needed.    Informed that when she became pregnant, there was miscommunication between her doctors and she stop taking all psychiatric medication.  Informed that there was a noticeable change in her mood off mediation, and eventually she had a psychiatric crisis that required hospitalization for the first time in her life. Informed that she was restarted on medication and the symptoms eventually resolved.  Mother and FOB states that they learned that she needs to be on medication even with future pregnancy.  Mother states that although she was distraught about having a baby boy, she is very happy with her boy and notes that these feelings were during the times when she was emotionally unstable.     She reports no current symptom of depression or anxiety.   Informed that she has an appointment already scheduled with her Psychiatrist for Dec. 21, 2015.   She denies any hx of substance abuse.   No acute social concern reported at this time.  Parents report extensive family support.  Mother informed of social work Fish farm manager.      VI SOCIAL WORK PLAN Social Work Plan  No Further Intervention Required / No Barriers to Discharge   Type of pt/family education:   If child protective services report - county:  If child protective services report - date:   Information/referral to community resources comment:   Other social work plan:   Mother to follow up with psychiatrist regarding medication management

## 2014-10-12 NOTE — Progress Notes (Signed)
PPD#2 Pt's B/P much improved with Labetalol. She is still with 2+ edema. She is ready for discharge. Will plan on discharge with Lasix and Labetalol 200 BID

## 2014-10-17 ENCOUNTER — Ambulatory Visit (INDEPENDENT_AMBULATORY_CARE_PROVIDER_SITE_OTHER): Payer: BC Managed Care – PPO | Admitting: Internal Medicine

## 2014-10-17 VITALS — BP 170/112 | HR 99 | Temp 99.0°F | Resp 18 | Ht 66.5 in | Wt 219.6 lb

## 2014-10-17 DIAGNOSIS — F988 Other specified behavioral and emotional disorders with onset usually occurring in childhood and adolescence: Secondary | ICD-10-CM

## 2014-10-17 DIAGNOSIS — F411 Generalized anxiety disorder: Secondary | ICD-10-CM

## 2014-10-17 DIAGNOSIS — F39 Unspecified mood [affective] disorder: Secondary | ICD-10-CM

## 2014-10-17 DIAGNOSIS — F418 Other specified anxiety disorders: Secondary | ICD-10-CM

## 2014-10-17 DIAGNOSIS — F329 Major depressive disorder, single episode, unspecified: Secondary | ICD-10-CM

## 2014-10-17 DIAGNOSIS — F909 Attention-deficit hyperactivity disorder, unspecified type: Secondary | ICD-10-CM

## 2014-10-17 DIAGNOSIS — F419 Anxiety disorder, unspecified: Principal | ICD-10-CM

## 2014-10-17 DIAGNOSIS — N644 Mastodynia: Secondary | ICD-10-CM

## 2014-10-17 DIAGNOSIS — F32A Depression, unspecified: Secondary | ICD-10-CM

## 2014-10-17 MED ORDER — CLONAZEPAM 2 MG PO TABS: 2.0000 mg | ORAL_TABLET | Freq: Two times a day (BID) | ORAL | Status: DC

## 2014-10-17 MED ORDER — OXYCODONE-ACETAMINOPHEN 5-325 MG PO TABS: 1.0000 | ORAL_TABLET | Freq: Four times a day (QID) | ORAL | Status: DC | PRN

## 2014-10-17 MED ORDER — AMPHETAMINE-DEXTROAMPHETAMINE 30 MG PO TABS: 30.0000 mg | ORAL_TABLET | Freq: Two times a day (BID) | ORAL | Status: DC

## 2014-10-17 NOTE — Progress Notes (Signed)
   Subjective:    Patient ID: Ana Best, female    DOB: 07/24/1986, 28 y.o.   MRN: [id number] This chart was scribed for Ana Siaobert Ahlam Piscitelli, MD by Leona CarryG. Clay Sherrill, ED Scribe. The patient was seen in Room 1. The patient's care was started at 5:07 PM.   HPI HPI Comments: Ana Best is a 28 y.o. female who presents today for a medication refill on both Aderrall and Klonopin. Patient gave birth 1 week ago to a healthy baby. The baby had no distress. Note that she was able to continue her medicines for mood disorder and attention deficit disorder and anxiety throughout her pregnancy.  Because of her medication she is not nursing, and she is having significant discomfort with engorgement of both breasts. Patient denies any post-partum depression.   PCP is Dr. Merla Richesoolittle.    Review of Systems  Constitutional: Negative for fever.  Eyes: Negative for visual disturbance.  Respiratory: Negative for chest tightness and shortness of breath.   Cardiovascular: Negative for chest pain and palpitations.  Gastrointestinal: Negative for abdominal pain.  Genitourinary: Negative for difficulty urinating.  Neurological: Negative for headaches.  Psychiatric/Behavioral: Negative for behavioral problems and dysphoric mood. The patient is not nervous/anxious.        Anxiety is controlled by Klonopin now only needs this once or twice a day. Occasionally needs it for sleep       Objective:   Physical Exam  Constitutional: She is oriented to person, place, and time. She appears well-developed and well-nourished. No distress.  HENT:  Head: Normocephalic and atraumatic.  Eyes: Conjunctivae and EOM are normal. Pupils are equal, round, and reactive to light.  Neck: Normal range of motion. Neck supple.  Cardiovascular: Normal rate.   Pulmonary/Chest: Effort normal. No respiratory distress.  Breast engorgement  Musculoskeletal: Normal range of motion.  Neurological: She is alert and oriented to person,  place, and time. No cranial nerve deficit.  Skin: Skin is warm and dry.  Psychiatric: She has a normal mood and affect. Her behavior is normal.  Nursing note and vitals reviewed.         Assessment & Plan:  I have completed the patient encounter in its entirety as documented by the scribe, with editing by me where necessary. Hally Colella P. Merla Richesoolittle, M.D. Anxiety  - Plan: clonazePAM (KLONOPIN) 2 MG tablet refilled had twice a day  ADD (attention deficit disorder)--refill Adderall  Mastalgia secondary to lactation-handout given regarding various methods to control discomfort  Okay for short-term Percocet  Mood disorder--continue other medicines  Follow-up 3-6 months depending on needs

## 2014-10-23 NOTE — Discharge Summary (Signed)
Obstetric Discharge Summary Reason for Admission: Elevated BPs, R/O Pre-eclampsia Prenatal Procedures: US, NST Intrapartum Procedures: NA undelivered Postpartum Procedures: NA, undelivered Complications-Operative and Postpartum: NA undelivered HEMOGLOBIN  Date Value Ref Range Status  10/11/2014 11.4* 12.0 - 15.0 g/dL Final   HCT  Date Value Ref Range Status  10/11/2014 33.5* 36.0 - 46.0 % Final    Physical Exam:  General: AOx3 Reactive NST  Discharge Diagnoses: Gestational hypertension  Discharge Information: Date: 10/23/2014 Activity: Bedrest Diet: routine Medications: Continue home meds Condition: Stable Instructions: As instructed Discharge to: Home    Ana Best H. 10/23/2014, 12:35 AM

## 2015-01-05 ENCOUNTER — Other Ambulatory Visit: Payer: Self-pay

## 2015-01-05 NOTE — Telephone Encounter (Signed)
Patient requesting a refill on her "Adderall" medication please call when ready to be picked up at [phone]

## 2015-01-07 NOTE — Telephone Encounter (Signed)
Pended. Current Rxs should last until 01/15/15.

## 2015-01-08 NOTE — Telephone Encounter (Signed)
Dr Merla Richesoolittle, please review. See Pt's statement disputing when RFs would be due to be filled.

## 2015-01-08 NOTE — Telephone Encounter (Signed)
Patient called to follow up medication refill. Patient states that her current Adderral prescription is good until the 21st not the 25th. Please call patient if there are any questions.

## 2015-01-10 ENCOUNTER — Telehealth: Payer: Self-pay

## 2015-01-10 MED ORDER — AMPHETAMINE-DEXTROAMPHETAMINE 30 MG PO TABS: 30.0000 mg | ORAL_TABLET | Freq: Two times a day (BID) | ORAL | Status: DC

## 2015-01-10 NOTE — Addendum Note (Signed)
Addended by: Tonye PearsonOLITTLE, ROBERT P on: 01/10/2015 01:58 PM   Modules accepted: Orders, Medications

## 2015-01-10 NOTE — Telephone Encounter (Signed)
Meds ordered this encounter  Medications  . DISCONTD: amphetamine-dextroamphetamine (ADDERALL) 30 MG tablet    Sig: Take 1 tablet by mouth 2 (two) times daily. FILL ON OR AFTER 01/15/15.    Dispense:  60 tablet    Refill:  0  . DISCONTD: amphetamine-dextroamphetamine (ADDERALL) 30 MG tablet    Sig: Take 1 tablet by mouth 2 (two) times daily. Fill 30 days after 01/15/15.    Dispense:  60 tablet    Refill:  0  . DISCONTD: amphetamine-dextroamphetamine (ADDERALL) 30 MG tablet    Sig: Take 1 tablet by mouth 2 (two) times daily. For 60 days after 01/15/15    Dispense:  60 tablet    Refill:  0  . amphetamine-dextroamphetamine (ADDERALL) 30 MG tablet    Sig: Take 1 tablet by mouth 2 (two) times daily. For 60 days after 01/11/15    Dispense:  60 tablet    Refill:  0  . amphetamine-dextroamphetamine (ADDERALL) 30 MG tablet    Sig: Take 1 tablet by mouth 2 (two) times daily. Fill 30 days after 01/11/15.    Dispense:  60 tablet    Refill:  0  . amphetamine-dextroamphetamine (ADDERALL) 30 MG tablet    Sig: Take 1 tablet by mouth 2 (two) times daily. FILL ON OR AFTER 01/11/15.    Dispense:  60 tablet    Refill:  0   She can pick up

## 2015-01-10 NOTE — Telephone Encounter (Signed)
Patient requesting to pick up a refill on her "Adderall" mediation today before we closed. Please call patient at [phone] when ready to be picked up. (rx shows it was printed out today and Dr Merla Richesoolittle may have it-not in drawer for pick up)

## 2015-01-11 NOTE — Telephone Encounter (Signed)
[facility]

## 2015-04-06 ENCOUNTER — Telehealth: Payer: Self-pay

## 2015-04-06 MED ORDER — AMPHETAMINE-DEXTROAMPHETAMINE 30 MG PO TABS: 30.0000 mg | ORAL_TABLET | Freq: Two times a day (BID) | ORAL | Status: DC

## 2015-04-06 NOTE — Telephone Encounter (Signed)
rx printed.  Meds ordered this encounter  Medications  . amphetamine-dextroamphetamine (ADDERALL) 30 MG tablet    Sig: Take 1 tablet by mouth 2 (two) times daily.    Dispense:  60 tablet    Refill:  0    Order Specific Question:  Supervising Provider    Answer:  DOOLITTLE, ROBERT P [[id number]]

## 2015-04-06 NOTE — Telephone Encounter (Signed)
Shw eoul dlike another refill, she will run out of adderall on the 21st. She will see Dr. Merla Richesoolittle before the Rx runs out. Advised her of times Dr. Merla Richesoolittle will be here.

## 2015-04-06 NOTE — Telephone Encounter (Signed)
PATIENT STATES SHE IS TO GET HER ADDERALL 30 MG PRESCRIPTION WRITTEN FROM DR. DOOLITTLE ON MAY 21 ST. HOWEVER, SHE IS NOT SURE IF SHE NEEDS TO RETURN TO BE SEEN FIRST OR WILL HE JUST WRITE IT FOR HER? BEST PHONE [phone] (CELL) MBC

## 2015-04-06 NOTE — Telephone Encounter (Signed)
She was last seen in 09/2014, so he wants to see her for follow-up.

## 2015-04-07 NOTE — Telephone Encounter (Signed)
Called pt, Rx ready ti pick up.

## 2015-04-07 NOTE — Telephone Encounter (Signed)
Rx in pick up draw. 

## 2015-05-01 ENCOUNTER — Ambulatory Visit (INDEPENDENT_AMBULATORY_CARE_PROVIDER_SITE_OTHER): Payer: Self-pay | Admitting: Internal Medicine

## 2015-05-01 VITALS — BP 124/92 | HR 125 | Temp 98.7°F | Resp 20 | Ht 66.5 in | Wt 190.0 lb

## 2015-05-01 DIAGNOSIS — F418 Other specified anxiety disorders: Secondary | ICD-10-CM

## 2015-05-01 DIAGNOSIS — F988 Other specified behavioral and emotional disorders with onset usually occurring in childhood and adolescence: Secondary | ICD-10-CM

## 2015-05-01 DIAGNOSIS — F419 Anxiety disorder, unspecified: Secondary | ICD-10-CM

## 2015-05-01 DIAGNOSIS — F909 Attention-deficit hyperactivity disorder, unspecified type: Secondary | ICD-10-CM

## 2015-05-01 DIAGNOSIS — F32A Depression, unspecified: Secondary | ICD-10-CM

## 2015-05-01 DIAGNOSIS — F329 Major depressive disorder, single episode, unspecified: Secondary | ICD-10-CM

## 2015-05-01 MED ORDER — AMPHETAMINE-DEXTROAMPHETAMINE 30 MG PO TABS: 30.0000 mg | ORAL_TABLET | Freq: Two times a day (BID) | ORAL | Status: DC

## 2015-05-01 MED ORDER — CLONAZEPAM 2 MG PO TABS: 2.0000 mg | ORAL_TABLET | Freq: Two times a day (BID) | ORAL | Status: DC

## 2015-05-01 NOTE — Progress Notes (Signed)
Subjective:  This chart was scribed for Ana Sia MD,by Hatice Demirci,scribe, at [facility].  This patient was seen in room 1 and the patient's care was started at 2:13 PM.    Patient ID: Ana Best, female    DOB: 03/18/1986, 28 y.o.   MRN: [id number] Chief Complaint  Patient presents with  . Medication Refill    adderall, clonazapam     HPI  HPI Comments: Ana Best is a 29 y.o. female who presents to the [facility] for medication refill for adderall and Klonopin. She feels like she is not functioning at the same level that she usually does around 4-5 pm and is wondering if she can have some type of adjustment so this does not occur. She also feels very groggy in the morning.  She is now a mother and is constantly busy with the baby while her fiance is at work.  She will go back to work in August.  She has been complaint with her Adderall and is now only taking 2 klonopin, Lamictal and abilify.  She is no longer taking her labetalol.   She has no other complaints or concerns today.   Patient Active Problem List   Diagnosis Date Noted  . Normal delivery 10/09/2014  . Difficulty controlling anger 06/01/2014  . Nicotine addiction 01/02/2013  . ADD (attention deficit disorder) 07/11/2012  . Mood disorder 07/11/2012  . Poor self esteem 07/11/2012   Past Medical History  Diagnosis Date  . Anxiety   . Depression   . Bipolar 1 disorder   . Hypertension    Past Surgical History  Procedure Laterality Date  . Tonsillectomy    . Dilation and curettage of uterus    . Wisdom tooth extraction     No Known Allergies Prior to Admission medications   Medication Sig Start Date End Date Taking? Authorizing Provider  amphetamine-dextroamphetamine (ADDERALL) 30 MG tablet Take 1 tablet by mouth 2 (two) times daily. For 60 days after 01/11/15 01/10/15  Yes Tonye Pearson, MD  ARIPiprazole (ABILIFY) 5 MG tablet Take 1 tablet (5 mg total)  by mouth daily. 07/04/14  Dr. Tomasa Rand  Yes Tonye Pearson, MD  clonazePAM (KLONOPIN) 2 MG tablet Take 1 tablet (2 mg total) by mouth 2 (two) times daily. 10/17/14  Yes Tonye Pearson, MD  lamoTRIgine (LAMICTAL) 25 MG tablet Take 1 tablet (25 mg total) by mouth daily. 07/04/14  Dr. Tomasa Rand  Yes Tonye Pearson, MD   History   Social History  . Marital Status: Single    Spouse Name: N/A  . Number of Children: N/A  . Years of Education: N/A   Occupational History  . Not on file.   Social History Main Topics  . Smoking status: Current Every Day Smoker -- 0.50 packs/day for 10 years    Types: Cigarettes    Last Attempt to Quit: 03/08/2014  . Smokeless tobacco: Never Used  . Alcohol Use: No     Comment: drinks- 3-4 beers  . Drug Use: No  . Sexual Activity:    Partners: Male     Comment: number of sex partners in the last 12 months  1   Other Topics Concern  . Not on file   Social History Narrative     No Known Allergies     Review of Systems  Constitutional: Negative for fever and chills.  Gastrointestinal: Negative for nausea and vomiting.  Objective:   Physical Exam  Constitutional: She is oriented to person, place, and time. She appears well-developed and well-nourished. No distress.  Eyes: EOM are normal. Pupils are equal, round, and reactive to light.  Cardiovascular: Normal rate.   Pulmonary/Chest: Effort normal.  Neurological: She is alert and oriented to person, place, and time. No cranial nerve deficit.  Psychiatric: She has a normal mood and affect. Her behavior is normal. Judgment and thought content normal.   Filed Vitals:   05/01/15 1408  BP: 124/92  Pulse: 125  Temp: 98.7 F (37.1 C)  TempSrc: Oral  Resp: 20  Height: 5' 6.5" (1.689 m)  Weight: 190 lb (86.183 kg)  SpO2: 98%          Assessment & Plan:  I have completed the patient encounter in its entirety as documented by the scribe, with editing by me where  necessary. Deanna Wiater P. Merla Riches, M.D. ADD (attention deficit disorder) -She will experiment with altering her dose regimen perhaps taking one dose in the morning and then a dose after the babies nap late in the afternoon or splitting her second dose into halves so she can have 3 dosing opportunities while maintaining her total milligrams at 60  Anxiety  - Plan: clonazePAM (KLONOPIN) 2 MG tablet--- now down to twice a day  Meds ordered this encounter  Medications  . amphetamine-dextroamphetamine (ADDERALL) 30 MG tablet    Sig: Take 1 tablet by mouth 2 (two) times daily. For 60 days after 01/11/15    Dispense:  60 tablet    Refill:  0  . amphetamine-dextroamphetamine (ADDERALL) 30 MG tablet    Sig: Take 1 tablet by mouth 2 (two) times daily. Fill 30 days after 01/11/15.    Dispense:  60 tablet    Refill:  0  . amphetamine-dextroamphetamine (ADDERALL) 30 MG tablet    Sig: Take 1 tablet by mouth 2 (two) times daily.    Dispense:  60 tablet    Refill:  0  . clonazePAM (KLONOPIN) 2 MG tablet    Sig: Take 1 tablet (2 mg total) by mouth 2 (two) times daily.    Dispense:  60 tablet    Refill:  5   call3   F/u 6 And TIMI follow-up with psychiatry for mood disorder

## 2015-07-30 ENCOUNTER — Telehealth: Payer: Self-pay

## 2015-07-30 DIAGNOSIS — F988 Other specified behavioral and emotional disorders with onset usually occurring in childhood and adolescence: Secondary | ICD-10-CM

## 2015-07-30 NOTE — Telephone Encounter (Signed)
Ok for 3 months of adderall but i can't sign til Monday pm so ask PA to print and sign for me

## 2015-07-30 NOTE — Telephone Encounter (Signed)
Patient is needing a refill on her adderall.  Please contact patient when rx is ready for pick-up.  Best#: [id number]

## 2015-07-31 MED ORDER — AMPHETAMINE-DEXTROAMPHETAMINE 30 MG PO TABS: 30.0000 mg | ORAL_TABLET | Freq: Two times a day (BID) | ORAL | Status: DC

## 2015-07-31 NOTE — Telephone Encounter (Signed)
Rx printed

## 2015-07-31 NOTE — Telephone Encounter (Signed)
Notified pt ready. 

## 2015-10-19 ENCOUNTER — Encounter: Payer: Self-pay | Admitting: Internal Medicine

## 2015-10-26 ENCOUNTER — Telehealth: Payer: Self-pay

## 2015-10-26 DIAGNOSIS — F988 Other specified behavioral and emotional disorders with onset usually occurring in childhood and adolescence: Secondary | ICD-10-CM

## 2015-10-26 NOTE — Telephone Encounter (Signed)
Last OV Tonye Pearsonobert P Doolittle, MD at 05/01/2015 2:12 PM

## 2015-10-26 NOTE — Telephone Encounter (Signed)
Pt in need of her ADDERALL 30 MG. Please call [phone] when ready for pick up

## 2015-10-28 MED ORDER — AMPHETAMINE-DEXTROAMPHETAMINE 30 MG PO TABS: 30.0000 mg | ORAL_TABLET | Freq: Two times a day (BID) | ORAL | Status: DC

## 2015-10-28 NOTE — Telephone Encounter (Signed)
Called pt , vm not set up yet 

## 2015-10-28 NOTE — Telephone Encounter (Signed)
Meds ordered this encounter  Medications  . amphetamine-dextroamphetamine (ADDERALL) 30 MG tablet    Sig: Take 1 tablet by mouth 2 (two) times daily.    Dispense:  60 tablet    Refill:  0   Set up appt in one month for refills

## 2015-10-28 NOTE — Telephone Encounter (Signed)
Patient called regarding her request for refill of adderall.  States she called a week ago about an OV.   [phone]

## 2015-10-29 ENCOUNTER — Other Ambulatory Visit: Payer: Self-pay

## 2015-10-29 DIAGNOSIS — F329 Major depressive disorder, single episode, unspecified: Secondary | ICD-10-CM

## 2015-10-29 DIAGNOSIS — F32A Depression, unspecified: Secondary | ICD-10-CM

## 2015-10-29 DIAGNOSIS — F419 Anxiety disorder, unspecified: Principal | ICD-10-CM

## 2015-10-29 MED ORDER — CLONAZEPAM 2 MG PO TABS: 2.0000 mg | ORAL_TABLET | Freq: Two times a day (BID) | ORAL | Status: DC

## 2015-10-29 NOTE — Telephone Encounter (Signed)
Scheduling, can you please check to make sure pt gets appt set up? Per this message, it looks like pt called to make appt, but don't see one scheduled? Thanks for checking into it!

## 2015-10-29 NOTE — Telephone Encounter (Signed)
Looks like pt is aware of need for f/up (per 12/5 message). I don't see appt set up yet. I will ask Sch to make sure pt gets an appt.

## 2015-10-29 NOTE — Telephone Encounter (Signed)
Pt picked up Rx.

## 2015-10-30 ENCOUNTER — Other Ambulatory Visit: Payer: Self-pay | Admitting: *Deleted

## 2015-10-30 ENCOUNTER — Telehealth: Payer: Self-pay

## 2015-10-30 DIAGNOSIS — F329 Major depressive disorder, single episode, unspecified: Secondary | ICD-10-CM

## 2015-10-30 DIAGNOSIS — F419 Anxiety disorder, unspecified: Principal | ICD-10-CM

## 2015-10-30 MED ORDER — CLONAZEPAM 2 MG PO TABS: 2.0000 mg | ORAL_TABLET | Freq: Two times a day (BID) | ORAL | Status: DC

## 2015-10-30 NOTE — Telephone Encounter (Signed)
PA approved for Adderall 30 BID through 10/29/16, case # [id number]. Notified pharm.

## 2015-10-30 NOTE — Telephone Encounter (Signed)
Tried to call Pt. To find out exactly which prescription she is talking about, but she doesn't have a VM set up

## 2015-10-30 NOTE — Telephone Encounter (Signed)
Patient would like prescription to be sent to pharmacy.

## 2015-11-01 NOTE — Telephone Encounter (Signed)
She picked up Rx for Adderall on 10/30/15

## 2015-11-03 NOTE — Telephone Encounter (Signed)
Patient has an appointment with Dr Merla Richesoolittle on 12/16.

## 2015-11-06 ENCOUNTER — Ambulatory Visit (INDEPENDENT_AMBULATORY_CARE_PROVIDER_SITE_OTHER): Payer: BC Managed Care – PPO | Admitting: Internal Medicine

## 2015-11-06 ENCOUNTER — Encounter: Payer: Self-pay | Admitting: Internal Medicine

## 2015-11-06 VITALS — BP 116/80 | HR 101 | Temp 97.3°F | Resp 16 | Ht 67.0 in | Wt 185.0 lb

## 2015-11-06 DIAGNOSIS — F329 Major depressive disorder, single episode, unspecified: Secondary | ICD-10-CM

## 2015-11-06 DIAGNOSIS — F988 Other specified behavioral and emotional disorders with onset usually occurring in childhood and adolescence: Secondary | ICD-10-CM

## 2015-11-06 DIAGNOSIS — F39 Unspecified mood [affective] disorder: Secondary | ICD-10-CM | POA: Diagnosis not present

## 2015-11-06 DIAGNOSIS — F418 Other specified anxiety disorders: Secondary | ICD-10-CM

## 2015-11-06 DIAGNOSIS — F909 Attention-deficit hyperactivity disorder, unspecified type: Secondary | ICD-10-CM

## 2015-11-06 DIAGNOSIS — F419 Anxiety disorder, unspecified: Secondary | ICD-10-CM

## 2015-11-06 DIAGNOSIS — Z23 Encounter for immunization: Secondary | ICD-10-CM

## 2015-11-06 MED ORDER — LAMOTRIGINE 100 MG PO TABS: 100.0000 mg | ORAL_TABLET | Freq: Every day | ORAL | Status: DC

## 2015-11-06 MED ORDER — AMPHETAMINE-DEXTROAMPHETAMINE 30 MG PO TABS: 30.0000 mg | ORAL_TABLET | Freq: Three times a day (TID) | ORAL | Status: DC

## 2015-11-06 MED ORDER — ARIPIPRAZOLE 20 MG PO TABS: 20.0000 mg | ORAL_TABLET | Freq: Every day | ORAL | Status: DC

## 2015-11-06 MED ORDER — CLONAZEPAM 2 MG PO TABS: 2.0000 mg | ORAL_TABLET | Freq: Two times a day (BID) | ORAL | Status: DC

## 2015-11-06 MED ORDER — AMPHETAMINE-DEXTROAMPHETAMINE 30 MG PO TABS: ORAL_TABLET | ORAL | Status: DC

## 2015-11-06 NOTE — Patient Instructions (Signed)
[facility] or anyone in that practice to take over for Dr Tomasa Randcunningham

## 2015-11-08 NOTE — Progress Notes (Signed)
Subjective:    Patient ID: Ana Best, female    DOB: Dec 30, 1985, 29 y.o.   MRN: [id number]  HPIf/u Patient Active Problem List   Diagnosis Date Noted  . Normal delivery--baby doing well 10/09/2014  . Difficulty controlling anger 06/01/2014  . Nicotine addiction 01/02/2013  . ADD (attention deficit disorder) 07/11/2012  . Mood disorder (HCC)---bipolar II 07/11/2012  . Poor self esteem 07/11/2012   Has resumed work and her adderall 30am, 30noon, wears off at home 5pm and she is having great trouble with being a mom and a partner during this time. No sleep issues Feels she is doing well on meds -abilify20 lamictal 100 Klonopin prn Has stpped  Seeing Dr Tyson Dense med ref///needs new psy fu Still feels she doesn't need therapy  See last hosp [facility] 2015 while pregnant after stopping her meds  Mother: [name] [phone] Boyfriend: Sofie Rower [phone]   Review of Systems [place]    Objective:   Physical Exam  Constitutional: She is oriented to person, place, and time. She appears well-developed and well-nourished. No distress.  HENT:  Head: Normocephalic and atraumatic.  Eyes: Pupils are equal, round, and reactive to light.  Neck: Normal range of motion.  Cardiovascular: Normal rate and regular rhythm.   Pulmonary/Chest: Effort normal. No respiratory distress.  Musculoskeletal: Normal range of motion.  Neurological: She is alert and oriented to person, place, and time.  Skin: Skin is warm and dry.  Psychiatric: She has a normal mood and affect. Her behavior is normal.  Nursing note and vitals reviewed.  BP 116/80 mmHg  Pulse 101  Temp(Src) 97.3 F (36.3 C)  Resp 16  Ht  (1.702 m)  Wt 185 lb (83.915 kg)  BMI 28.97 kg/m2     Assessment & Plan:  ADD (attention deficit disorder) - Plan: amphetamine-dextroamphetamine (ADDERALL) 30 MG tablet Will add 3rd dose each day--watch for insomnia, wt loss, irritability  Need for prophylactic vaccination  and inoculation against influenza - Plan: Flu Vaccine QUAD 36+ mos IM  Anxiety  - Plan: clonazePAM (KLONOPIN) 2 MG tablet  Mood disorder (HCC) -Ref to Dr Cottle's group  Meds ordered this encounter  Medications  . ABILIFY 20 MG tablet    Sig:   . amphetamine-dextroamphetamine (ADDERALL) 30 MG tablet    Sig: Add one tab late afternoon to current prescription    Dispense:  22 tablet    Refill:  0  . amphetamine-dextroamphetamine (ADDERALL) 30 MG tablet    Sig: Take 1 tablet by mouth 3 (three) times daily. For 11/28/15 or after    Dispense:  90 tablet    Refill:  0  . amphetamine-dextroamphetamine (ADDERALL) 30 MG tablet    Sig: Take 1 tablet by mouth 3 (three) times daily. For 12/29/15 or after    Dispense:  90 tablet    Refill:  0  . amphetamine-dextroamphetamine (ADDERALL) 30 MG tablet    Sig: Take 1 tablet by mouth 3 (three) times daily. For 01/26/16 or after    Dispense:  90 tablet    Refill:  0  . lamoTRIgine (LAMICTAL) 100 MG tablet    Sig: Take 1 tablet (100 mg total) by mouth daily.    Dispense:  30 tablet    Refill:  5  . ARIPiprazole (ABILIFY) 20 MG tablet    Sig: Take 1 tablet (20 mg total) by mouth daily.    Dispense:  30 tablet    Refill:  2  . clonazePAM (KLONOPIN) 2 MG tablet  Sig: Take 1 tablet (2 mg total) by mouth 2 (two) times daily. To follow current rx 12/7    Dispense:  60 tablet    Refill:  4   Fu 3-6 depending

## 2015-11-27 ENCOUNTER — Telehealth: Payer: Self-pay

## 2015-11-27 NOTE — Telephone Encounter (Signed)
Ana Best did this already. Called pt unable to leave message.

## 2015-11-27 NOTE — Telephone Encounter (Signed)
Patient is calling to see if we've received a PA for adderral. She states that's it's been faxed twice but she can't recall when.  [facility] on [address]

## 2015-11-27 NOTE — Telephone Encounter (Signed)
Spoke with pt, she states she needs another one because he increased her dose. So I will forward to HankinsonBarbara.

## 2015-11-30 NOTE — Telephone Encounter (Signed)
Started PA on covermymeds. Waiting for clinical questions to load.

## 2015-12-02 NOTE — Telephone Encounter (Signed)
PA was approved. Notified pharm and pt.

## 2015-12-03 NOTE — Telephone Encounter (Signed)
Approval date through 12/01/2018.

## 2016-01-11 IMAGING — US US OB LIMITED
1 series · 13 of 18 positions shown · non-contrast
Comparison: none

[Series 1: us fetal bpp w/o nonstress · non-contrast · 18 acquisitions, 13 frames shown]
[im 1/18]
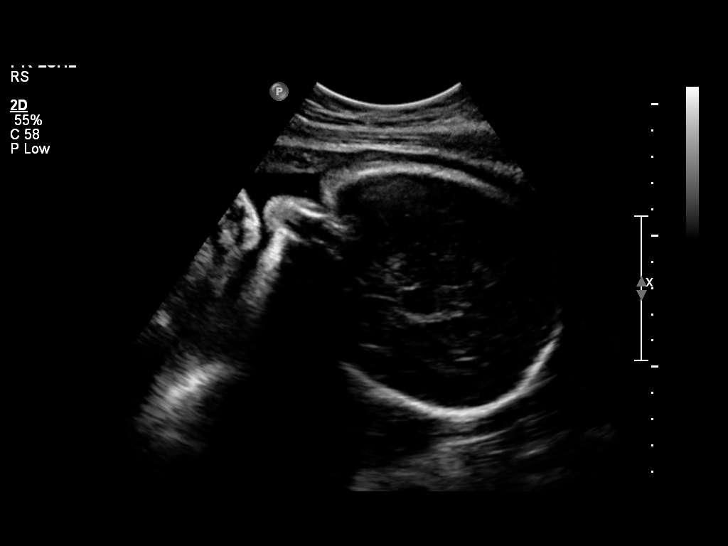
[im 3/18]
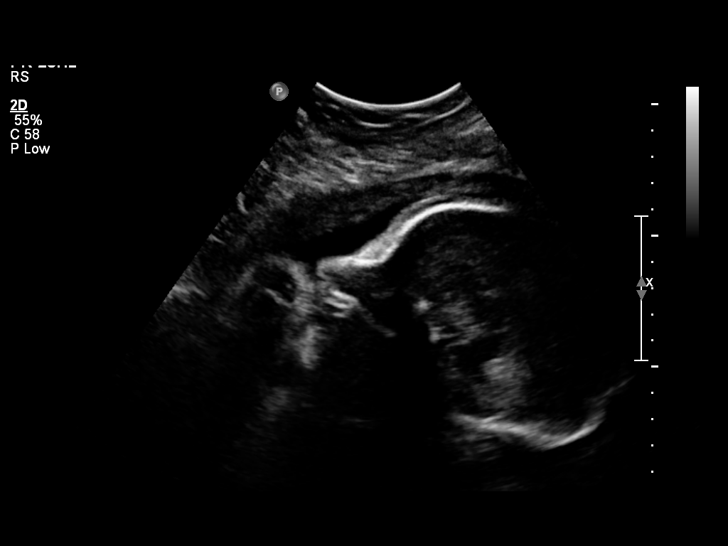
[im 4/18]
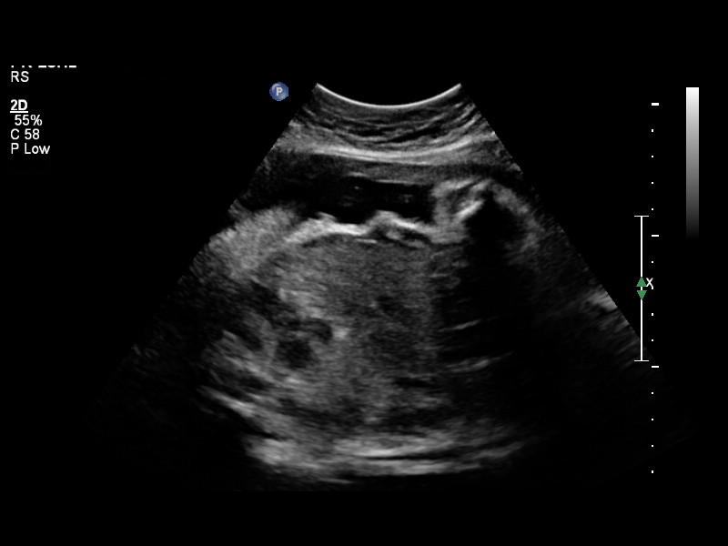
[im 5/18]
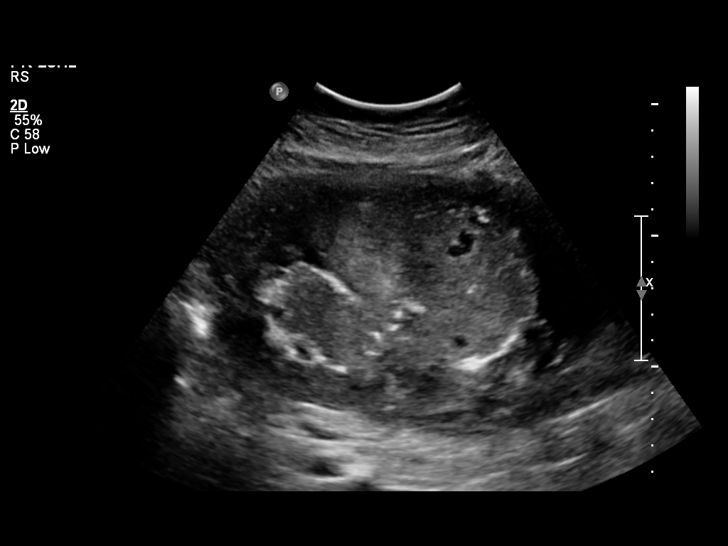
[im 7/18]
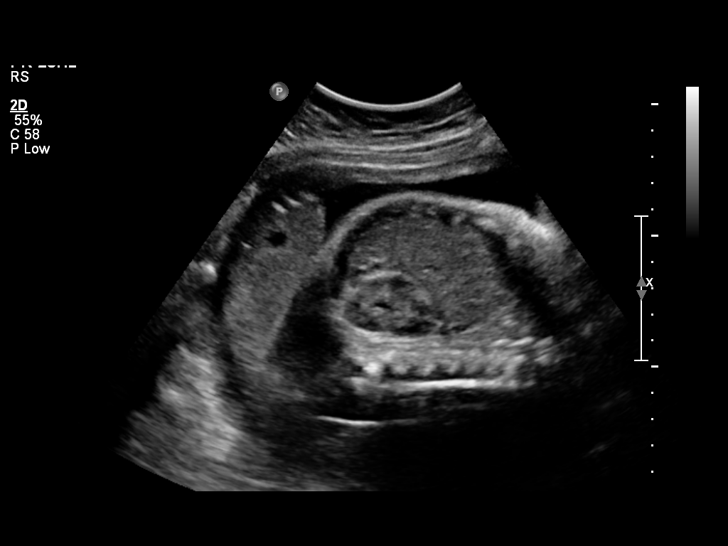
[im 8/18]
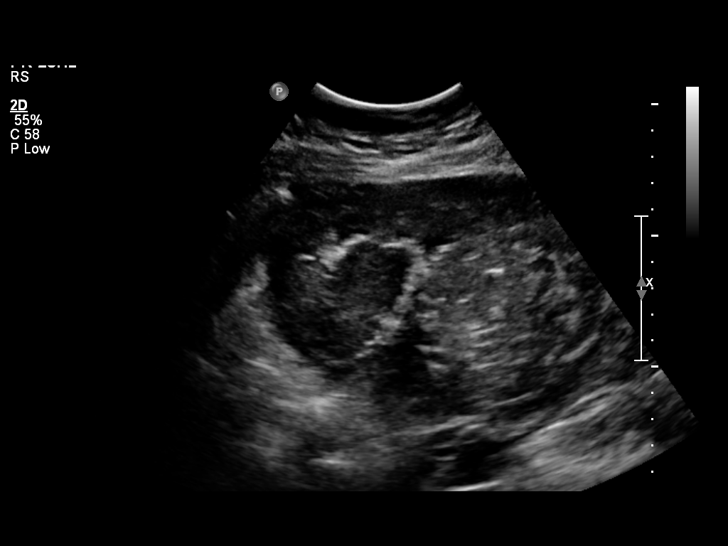
[im 10/18]
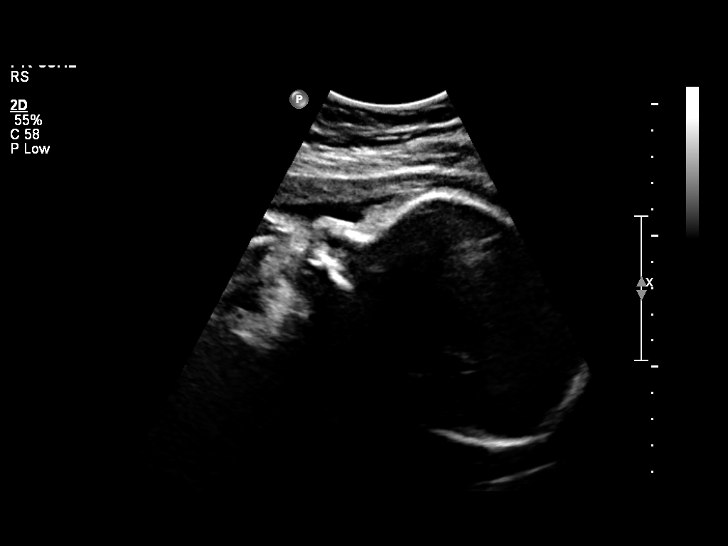
[im 11/18]
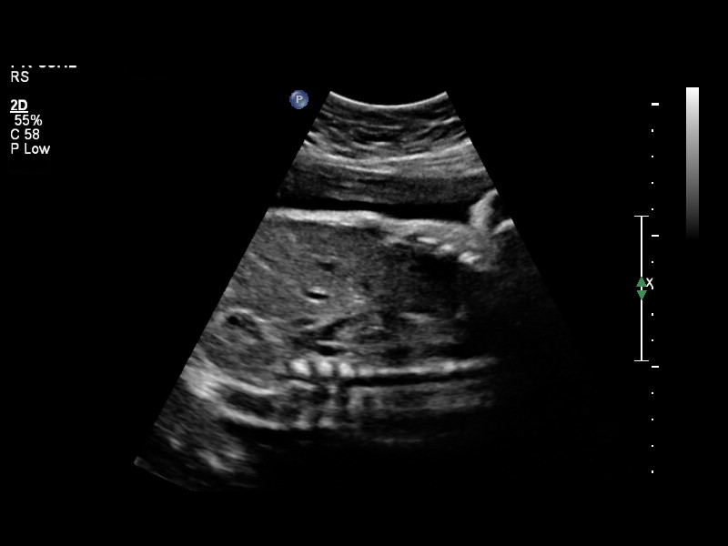
[im 12/18]
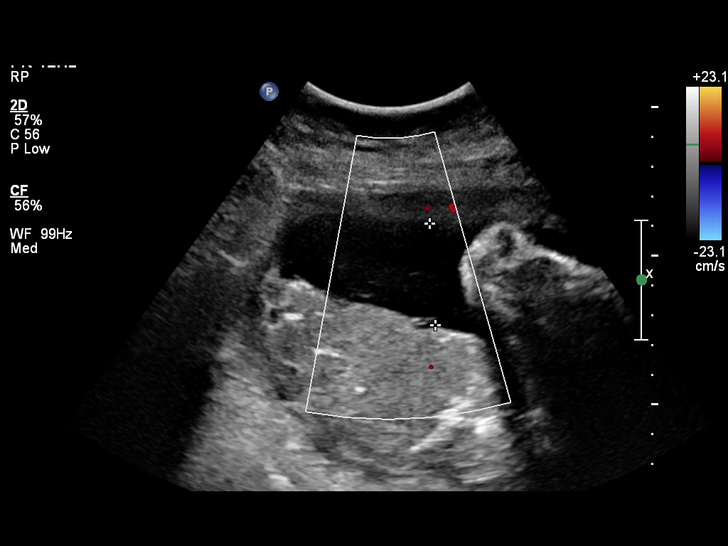
[im 14/18]
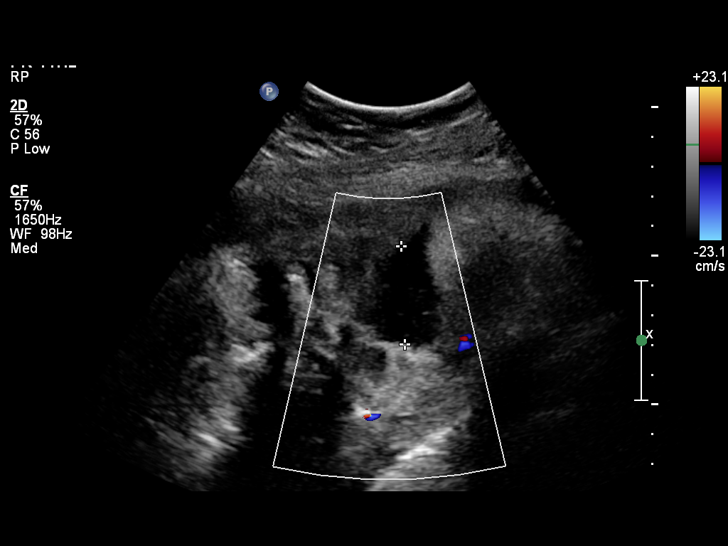
[im 15/18]
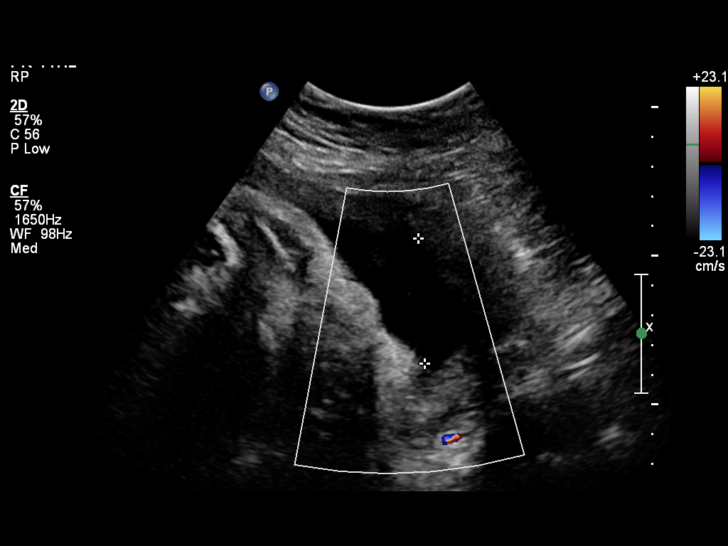
[im 16/18]
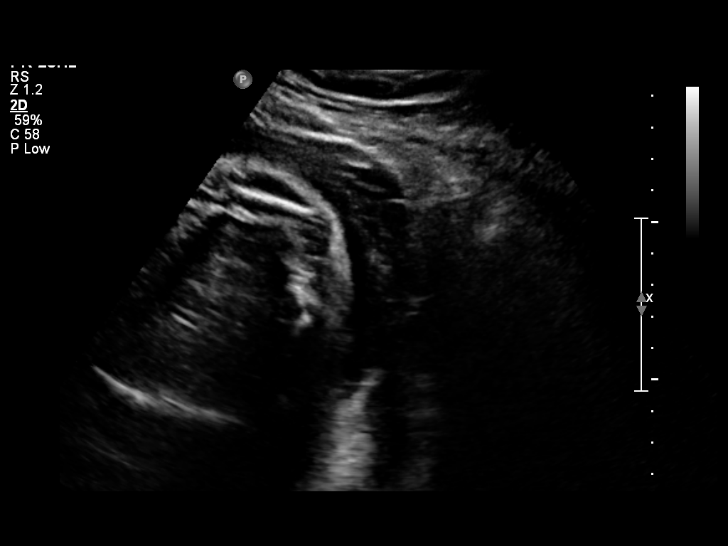
[im 18/18]
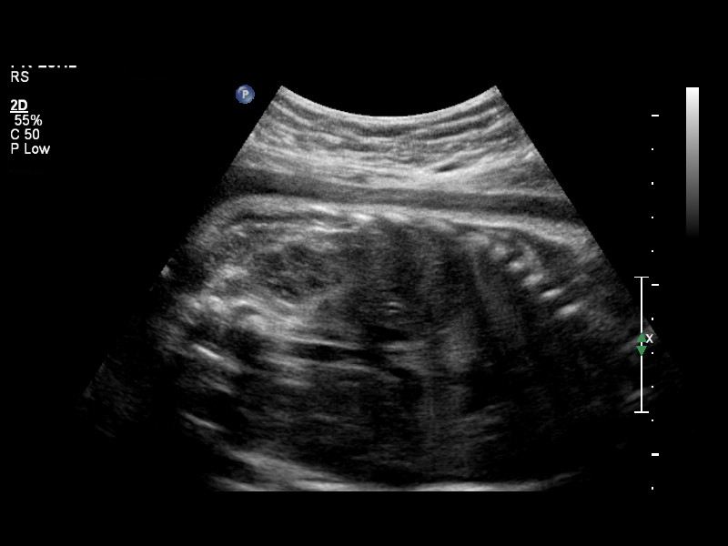

[13 of 18 positions shown; findings below may reference images not displayed]

OBSTETRICS REPORT
                      (Signed Final 09/23/2014 [DATE])

Service(s) Provided

 [HOSPITAL]                                         76815.0
Indications

 Non-reactive NST
 34 weeks gestation of pregnancy
 Anxiety during pregnancy, unspecified trimester       O99.340,
 Bipolar Disorder
 Mild to moderate preeclampsia, third trimester
Fetal Evaluation

 Num Of Fetuses:    1
 Fetal Heart Rate:  139                          bpm
 Cardiac Activity:  Observed
 Presentation:      Cephalic
 Placenta:          Posterior, above cervical
                    os
 P. Cord            Not well visualized
 Insertion:

 Amniotic Fluid
 AFI FV:      Subjectively within normal limits
 AFI Sum:     18.8    cm       70  %Tile     Larg Pckt:     7.9  cm
 RUQ:   3.41    cm   RLQ:    3.29   cm    LUQ:   7.9     cm   LLQ:    4.2    cm
Biophysical Evaluation

 Amniotic F.V:   Within normal limits       F. Tone:        Observed
 F. Movement:    Observed                   Score:          [DATE]
 F. Breathing:   Observed
Gestational Age

 LMP:           34w 4d        Date:  01/24/14                 EDD:   10/31/14
 Best:          34w 4d     Det. By:  LMP  (01/24/14)          EDD:   10/31/14
Cervix Uterus Adnexa

 Cervix:       Not visualized (advanced GA >13wks)
 Uterus:       No abnormality visualized.
 Cul De Sac:   No free fluid seen.

 Left Ovary:    Not visualized.
 Right Ovary:   Not visualized.
 Adnexa:     No abnormality visualized.
Impression

 Single IUP at 34w 4d
 Mild preeclampsia - managed as outpatient
 BPP [DATE]
 Normal amniotic fluid volume
Recommendations

 Continue antenatal testing as scheduled
 Recommend delivery at 37 weeks for mild preeclampsia
 Follow-up ultrasounds as clinically indicated.

 questions or concerns.

## 2016-02-22 ENCOUNTER — Other Ambulatory Visit: Payer: Self-pay | Admitting: Internal Medicine

## 2016-02-22 ENCOUNTER — Telehealth: Payer: Self-pay | Admitting: Internal Medicine

## 2016-02-22 DIAGNOSIS — F988 Other specified behavioral and emotional disorders with onset usually occurring in childhood and adolescence: Secondary | ICD-10-CM

## 2016-02-22 MED ORDER — AMPHETAMINE-DEXTROAMPHETAMINE 30 MG PO TABS: 30.0000 mg | ORAL_TABLET | Freq: Three times a day (TID) | ORAL | Status: DC

## 2016-02-22 NOTE — Telephone Encounter (Signed)
Patient request for a refill on Adderall. Patient need medication by the 7th. [phone].

## 2016-02-22 NOTE — Telephone Encounter (Signed)
Pt advised.

## 2016-02-22 NOTE — Telephone Encounter (Signed)
Meds ordered this encounter  Medications  . amphetamine-dextroamphetamine (ADDERALL) 30 MG tablet    Sig: Take 1 tablet by mouth 3 (three) times daily. For 11/28/15 or after    Dispense:  90 tablet    Refill:  0  . amphetamine-dextroamphetamine (ADDERALL) 30 MG tablet    Sig: Take 1 tablet by mouth 3 (three) times daily. For 12/29/15 or after    Dispense:  90 tablet    Refill:  0  . amphetamine-dextroamphetamine (ADDERALL) 30 MG tablet    Sig: Take 1 tablet by mouth 3 (three) times daily. For 01/26/16 or after    Dispense:  90 tablet    Refill:  0   In Dec we ref her to Dr Cottle's group--since her psych left--did she get a new MD there --I need to have this established as she can get her adderall there as well as her psych meds

## 2016-07-14 ENCOUNTER — Other Ambulatory Visit: Payer: Self-pay

## 2018-09-25 ENCOUNTER — Other Ambulatory Visit: Payer: Self-pay | Admitting: Psychiatry

## 2018-09-27 ENCOUNTER — Other Ambulatory Visit: Payer: Self-pay | Admitting: Psychiatry

## 2018-10-24 ENCOUNTER — Other Ambulatory Visit: Payer: Self-pay | Admitting: Psychiatry

## 2018-10-24 DIAGNOSIS — F900 Attention-deficit hyperactivity disorder, predominantly inattentive type: Secondary | ICD-10-CM

## 2018-10-24 DIAGNOSIS — F988 Other specified behavioral and emotional disorders with onset usually occurring in childhood and adolescence: Secondary | ICD-10-CM

## 2018-10-24 MED ORDER — AMPHETAMINE-DEXTROAMPHETAMINE 30 MG PO TABS: 30.0000 mg | ORAL_TABLET | Freq: Every day | ORAL | 0 refills | Status: DC

## 2018-10-24 MED ORDER — AMPHETAMINE-DEXTROAMPHETAMINE 30 MG PO TABS: 30.0000 mg | ORAL_TABLET | Freq: Three times a day (TID) | ORAL | 0 refills | Status: DC

## 2018-10-24 NOTE — Progress Notes (Signed)
adderall rx corrected

## 2018-10-24 NOTE — Progress Notes (Signed)
adderall refills 3 times

## 2018-11-18 ENCOUNTER — Other Ambulatory Visit: Payer: Self-pay | Admitting: Psychiatry

## 2018-11-18 NOTE — Telephone Encounter (Signed)
Need to review paper chart, not seen in epic  

## 2018-11-20 NOTE — Telephone Encounter (Signed)
Couldn't find her paper chart, do you have?

## 2018-11-22 NOTE — Telephone Encounter (Signed)
PCP was prescribing.  OK to Rx.  Next appt march

## 2018-12-10 ENCOUNTER — Encounter: Payer: Self-pay | Admitting: Emergency Medicine

## 2018-12-10 DIAGNOSIS — F410 Panic disorder [episodic paroxysmal anxiety] without agoraphobia: Secondary | ICD-10-CM

## 2018-12-26 ENCOUNTER — Other Ambulatory Visit: Payer: Self-pay | Admitting: Psychiatry

## 2018-12-26 NOTE — Telephone Encounter (Signed)
Need to review paper chart  

## 2019-01-16 ENCOUNTER — Encounter (HOSPITAL_COMMUNITY): Payer: Self-pay

## 2019-01-16 ENCOUNTER — Other Ambulatory Visit: Payer: Self-pay

## 2019-01-16 ENCOUNTER — Emergency Department (HOSPITAL_COMMUNITY)
Admission: EM | Admit: 2019-01-16 | Discharge: 2019-01-16 | Disposition: A | Discharge status: Home / Self Care | Payer: BC Managed Care – PPO | Attending: Emergency Medicine | Admitting: Emergency Medicine

## 2019-01-16 DIAGNOSIS — B9689 Other specified bacterial agents as the cause of diseases classified elsewhere: Secondary | ICD-10-CM | POA: Insufficient documentation

## 2019-01-16 DIAGNOSIS — I10 Essential (primary) hypertension: Secondary | ICD-10-CM | POA: Insufficient documentation

## 2019-01-16 DIAGNOSIS — F17210 Nicotine dependence, cigarettes, uncomplicated: Secondary | ICD-10-CM | POA: Insufficient documentation

## 2019-01-16 DIAGNOSIS — R102 Pelvic and perineal pain: Secondary | ICD-10-CM | POA: Diagnosis present

## 2019-01-16 DIAGNOSIS — N3000 Acute cystitis without hematuria: Secondary | ICD-10-CM | POA: Diagnosis not present

## 2019-01-16 DIAGNOSIS — Z79899 Other long term (current) drug therapy: Secondary | ICD-10-CM | POA: Diagnosis not present

## 2019-01-16 DIAGNOSIS — F909 Attention-deficit hyperactivity disorder, unspecified type: Secondary | ICD-10-CM | POA: Insufficient documentation

## 2019-01-16 DIAGNOSIS — N760 Acute vaginitis: Secondary | ICD-10-CM | POA: Insufficient documentation

## 2019-01-16 LAB — URINALYSIS, ROUTINE W REFLEX MICROSCOPIC
Bacteria, UA: NONE SEEN
Bilirubin Urine: NEGATIVE
Glucose, UA: NEGATIVE mg/dL
Ketones, ur: NEGATIVE mg/dL
Nitrite: NEGATIVE
Protein, ur: NEGATIVE mg/dL
Specific Gravity, Urine: 1.015 (ref 1.005–1.030)
WBC, UA: >50 WBC/hpf — ABNORMAL HIGH (ref 0–5)
pH: 5 (ref 5.0–8.0)

## 2019-01-16 LAB — WET PREP, GENITAL
Sperm: NONE SEEN
Trich, Wet Prep: NONE SEEN
YEAST WET PREP: NONE SEEN

## 2019-01-16 LAB — I-STAT BETA HCG BLOOD, ED (MC, WL, AP ONLY): I-stat hCG, quantitative: <5 m[IU]/mL (ref <5)

## 2019-01-16 MED ORDER — CEFTRIAXONE SODIUM 250 MG IJ SOLR
250.0000 mg | Freq: Once | INTRAMUSCULAR | Status: AC
  Administered 2019-01-16: 250 mg via INTRAMUSCULAR
  Filled 2019-01-16: qty 250

## 2019-01-16 MED ORDER — METRONIDAZOLE 500 MG PO TABS: 500.0000 mg | ORAL_TABLET | Freq: Two times a day (BID) | ORAL | 0 refills | Status: AC

## 2019-01-16 MED ORDER — DOXYCYCLINE HYCLATE 100 MG PO TABS
100.0000 mg | ORAL_TABLET | Freq: Once | ORAL | Status: AC
  Administered 2019-01-16: 100 mg via ORAL
  Filled 2019-01-16: qty 1

## 2019-01-16 MED ORDER — PHENAZOPYRIDINE HCL 200 MG PO TABS: 200.0000 mg | ORAL_TABLET | Freq: Three times a day (TID) | ORAL | 0 refills | Status: DC

## 2019-01-16 MED ORDER — LIDOCAINE HCL 1 % IJ SOLN
INTRAMUSCULAR | Status: AC
  Administered 2019-01-16: 0.9 mL
  Filled 2019-01-16: qty 20

## 2019-01-16 MED ORDER — NAPROXEN 375 MG PO TABS: 375.0000 mg | ORAL_TABLET | Freq: Two times a day (BID) | ORAL | 0 refills | Status: AC | PRN

## 2019-01-16 MED ORDER — METRONIDAZOLE 500 MG PO TABS
500.0000 mg | ORAL_TABLET | Freq: Once | ORAL | Status: AC
  Administered 2019-01-16: 500 mg via ORAL
  Filled 2019-01-16: qty 1

## 2019-01-16 MED ORDER — DOXYCYCLINE HYCLATE 100 MG PO CAPS: 100.0000 mg | ORAL_CAPSULE | Freq: Two times a day (BID) | ORAL | 0 refills | Status: AC

## 2019-01-16 NOTE — ED Notes (Signed)
Pelvic setup and ready at bedside 

## 2019-01-16 NOTE — ED Provider Notes (Signed)
[facility]-EMERGENCY DEPT Provider Note   CSN: [id number] Arrival date & time: 01/16/19  1142    History   Chief Complaint Chief Complaint  Patient presents with  . Pelvic Pain  . Vaginal Bleeding    HPI Ana Best is a 33 y.o. female.     HPI 33 year old female with history of bipolar disorder here with urinary symptoms.  The patient states that over the last several days, she is a progressively worsening, intermittent, stabbing lower pelvic and abd pain.  She states the symptoms are worse in the mornings after she urinates.  She has burning when she urinates.  No flank pain.  No nausea or vomiting.  She also has had some vaginal spotting and vaginal pain today.  She is sexually active with one partner only, denies any concern for STDs.  No recent weight loss, night sweats, or other complaints.  No other issues.  She is on birth control and states she does not normally spot between her periods.  Past Medical History:  Diagnosis Date  . Anxiety   . Bipolar 1 disorder (HCC)   . Depression   . Hypertension     Patient Active Problem List   Diagnosis Date Noted  . Panic 12/10/2018  . Normal delivery 10/09/2014  . Difficulty controlling anger 06/01/2014  . Nicotine addiction 01/02/2013  . ADD (attention deficit disorder) 07/11/2012  . Mood disorder (HCC) 07/11/2012  . Poor self esteem 07/11/2012    Past Surgical History:  Procedure Laterality Date  . DILATION AND CURETTAGE OF UTERUS    . TONSILLECTOMY    . WISDOM TOOTH EXTRACTION       OB History    Gravida  3   Para  1   Term  1   Preterm      AB  2   Living  1     SAB      TAB  2   Ectopic      Multiple  0   Live Births  1            Home Medications    Prior to Admission medications   Medication Sig Start Date End Date Taking? Authorizing Provider  amphetamine-dextroamphetamine (ADDERALL) 30 MG tablet Take 1 tablet by mouth 3 (three) times daily. 12/19/18  Yes  Cottle, Steva Ready., MD  Aspirin-Salicylamide-Caffeine [facility] HEADACHE POWDER PO) Take 1 packet by mouth daily as needed (headache and pain).   Yes [provider]  clonazePAM (KLONOPIN) 2 MG tablet Take 1 tablet (2 mg total) by mouth 2 (two) times daily. To follow current rx 12/7 Patient taking differently: Take 1.5 mg by mouth at bedtime.  11/06/15  Yes Tonye Pearson, MD  lamoTRIgine (LAMICTAL) 100 MG tablet TAKE 1 TABLET BY MOUTH ONCE DAILY Patient taking differently: Take 100 mg by mouth at bedtime.  11/22/18  Yes Cottle, Steva Ready., MD  SPRINTEC 28 0.25-35 MG-MCG tablet Take 1 tablet by mouth daily. 12/28/18  Yes [provider]  VRAYLAR 4.5 MG CAPS TAKE 1 CAPSULE BY MOUTH ONCE DAILY 12/27/18  Yes Cottle, Steva Ready., MD  amphetamine-dextroamphetamine (ADDERALL) 30 MG tablet Take 1 tablet by mouth 3 (three) times daily. Patient not taking: Reported on 01/16/2019 10/24/18   Lauraine Rinne., MD  amphetamine-dextroamphetamine (ADDERALL) 30 MG tablet Take 1 tablet by mouth 3 (three) times daily. Patient not taking: Reported on 01/16/2019 11/21/18   Cottle, Steva Ready., MD  ARIPiprazole (ABILIFY)  20 MG tablet Take 1 tablet (20 mg total) by mouth daily. Patient not taking: Reported on 01/16/2019 11/06/15   Tonye Pearson, MD  ARIPiprazole (ABILIFY) 5 MG tablet Take 1 tablet (5 mg total) by mouth daily. Patient not taking: Reported on 01/16/2019 07/04/14   Tonye Pearson, MD  doxycycline (VIBRAMYCIN) 100 MG capsule Take 1 capsule (100 mg total) by mouth 2 (two) times daily for 14 days. 01/16/19 01/30/19  Shaune Pollack, MD  metroNIDAZOLE (FLAGYL) 500 MG tablet Take 1 tablet (500 mg total) by mouth 2 (two) times daily for 14 days. 01/16/19 01/30/19  Shaune Pollack, MD  naproxen (NAPROSYN) 375 MG tablet Take 1 tablet (375 mg total) by mouth 2 (two) times daily as needed for up to 7 days for moderate pain. 01/16/19 01/23/19  Shaune Pollack, MD  phenazopyridine (PYRIDIUM) 200 MG tablet  Take 1 tablet (200 mg total) by mouth 3 (three) times daily. 01/16/19   Shaune Pollack, MD    Family History Family History  Problem Relation Age of Onset  . Depression Father   . Anxiety disorder Father   . ADD / ADHD Brother     Social History Social History   Tobacco Use  . Smoking status: Current Every Day Smoker    Packs/day: 0.50    Years: 10.00    Pack years: 5.00    Types: Cigarettes    Last attempt to quit: 03/08/2014    Years since quitting: 4.8  . Smokeless tobacco: Never Used  Substance Use Topics  . Alcohol use: Yes    Alcohol/week: 0.0 standard drinks    Comment: drinks- 3-4 beers  . Drug use: No     Allergies   Patient has no known allergies.   Review of Systems Review of Systems  Constitutional: Positive for fatigue. Negative for chills and fever.  HENT: Negative for congestion and rhinorrhea.   Eyes: Negative for visual disturbance.  Respiratory: Negative for cough, shortness of breath and wheezing.   Cardiovascular: Negative for chest pain and leg swelling.  Gastrointestinal: Negative for abdominal pain, diarrhea, nausea and vomiting.  Genitourinary: Positive for dysuria, frequency, vaginal bleeding and vaginal pain. Negative for flank pain.  Musculoskeletal: Negative for neck pain and neck stiffness.  Skin: Negative for rash and wound.  Allergic/Immunologic: Negative for immunocompromised state.  Neurological: Negative for syncope, weakness and headaches.  All other systems reviewed and are negative.    Physical Exam Updated Vital Signs BP 121/78   Pulse (!) 57   Temp 98.4 F (36.9 C) (Oral)   Resp 16   Ht 5' 6.5" (1.689 m)   Wt 90.7 kg   SpO2 99%   BMI 31.80 kg/m   Physical Exam Vitals signs and nursing note reviewed.  Constitutional:      General: She is not in acute distress.    Appearance: She is well-developed.  HENT:     Head: Normocephalic and atraumatic.  Eyes:     Conjunctiva/sclera: Conjunctivae normal.  Neck:      Musculoskeletal: Neck supple.  Cardiovascular:     Rate and Rhythm: Normal rate and regular rhythm.     Heart sounds: Normal heart sounds. No murmur. No friction rub.  Pulmonary:     Effort: Pulmonary effort is normal. No respiratory distress.     Breath sounds: Normal breath sounds. No wheezing or rales.  Abdominal:     General: Abdomen is flat. There is no distension.     Palpations: Abdomen is soft.  Tenderness: There is abdominal tenderness.  Genitourinary:    Comments: Moderate yellow-green vaginal discharge and occasional blood-tinged discharge.  Mild cervical motion tenderness.  No adnexal pain or fullness. Skin:    General: Skin is warm.     Capillary Refill: Capillary refill takes less than 2 seconds.  Neurological:     Mental Status: She is alert and oriented to person, place, and time.     Motor: No abnormal muscle tone.      ED Treatments / Results  Labs (all labs ordered are listed, but only abnormal results are displayed) Labs Reviewed  WET PREP, GENITAL - Abnormal; Notable for the following components:      Result Value   Clue Cells Wet Prep HPF POC PRESENT (*)    WBC, Wet Prep HPF POC MANY (*)    All other components within normal limits  URINALYSIS, ROUTINE W REFLEX MICROSCOPIC - Abnormal; Notable for the following components:   APPearance HAZY (*)    Hgb urine dipstick MODERATE (*)    Leukocytes,Ua MODERATE (*)    WBC, UA >50 (*)    All other components within normal limits  I-STAT BETA HCG BLOOD, ED ([facility], [facility], [facility] ONLY)  GC/CHLAMYDIA PROBE AMP ([place]) NOT AT [facility]    EKG None  Radiology No results found.  Procedures Procedures (including critical care time)  Medications Ordered in ED Medications  cefTRIAXone (ROCEPHIN) injection 250 mg (250 mg Intramuscular Given 01/16/19 2102)  lidocaine (XYLOCAINE) 1 % (with pres) injection (0.9 mLs  Given 01/16/19 2103)  doxycycline (VIBRA-TABS) tablet 100 mg (100 mg Oral Given 01/16/19 2154)    metroNIDAZOLE (FLAGYL) tablet 500 mg (500 mg Oral Given 01/16/19 2154)     Initial Impression / Assessment and Plan / ED Course  I have reviewed the triage vital signs and the nursing notes.  Pertinent labs & imaging results that were available during my care of the patient were reviewed by me and considered in my medical decision making (see chart for details).       33 yo F with PMHx as above here with dysuria, frequency, and pelvic pain/pressure. On exam, she has moderate suprapubic TTP, CMT. While she has no significant risk factors for STD, concern for UTI w/ bladder spasm vs PID. No clinical signs of TOA. No flank pain ro signs of pyelo. Will start on ABX, d/c with outpt follow-up. UCx sent. Rocephin IM given.  Final Clinical Impressions(s) / ED Diagnoses   Final diagnoses:  BV (bacterial vaginosis)  Acute cystitis without hematuria    ED Discharge Orders         Ordered    doxycycline (VIBRAMYCIN) 100 MG capsule  2 times daily     01/16/19 2129    metroNIDAZOLE (FLAGYL) 500 MG tablet  2 times daily     01/16/19 2129    phenazopyridine (PYRIDIUM) 200 MG tablet  3 times daily     01/16/19 2129    naproxen (NAPROSYN) 375 MG tablet  2 times daily PRN     01/16/19 2131           Shaune Pollack, MD 01/16/19 2357

## 2019-01-16 NOTE — ED Notes (Signed)
ED Provider at bedside. ISAACS 

## 2019-01-16 NOTE — ED Notes (Signed)
Dr Isaacs at bedside 

## 2019-01-16 NOTE — ED Triage Notes (Signed)
Patient c/o intermittent pelvic pain x 1 month and vaginal bleeding. Patient states it is not time for her normal menstrual cycle.

## 2019-01-17 ENCOUNTER — Other Ambulatory Visit: Payer: Self-pay

## 2019-01-17 ENCOUNTER — Telehealth: Payer: Self-pay | Admitting: Psychiatry

## 2019-01-17 DIAGNOSIS — F900 Attention-deficit hyperactivity disorder, predominantly inattentive type: Secondary | ICD-10-CM

## 2019-01-17 LAB — GC/CHLAMYDIA PROBE AMP (~~LOC~~) NOT AT ARMC
CHLAMYDIA, DNA PROBE: NEGATIVE
Neisseria Gonorrhea: NEGATIVE

## 2019-01-17 MED ORDER — AMPHETAMINE-DEXTROAMPHETAMINE 30 MG PO TABS: 30.0000 mg | ORAL_TABLET | Freq: Three times a day (TID) | ORAL | 0 refills | Status: DC

## 2019-01-17 NOTE — Telephone Encounter (Signed)
Pt called need refill Adderall. [facility]  [address].    [phone]  Next appt 01/30/2019

## 2019-01-17 NOTE — Telephone Encounter (Signed)
rx submitted to provider for approval  

## 2019-01-30 ENCOUNTER — Encounter: Payer: Self-pay | Admitting: Psychiatry

## 2019-01-30 ENCOUNTER — Ambulatory Visit: Payer: BC Managed Care – PPO | Admitting: Psychiatry

## 2019-01-30 ENCOUNTER — Other Ambulatory Visit: Payer: Self-pay

## 2019-01-30 VITALS — BP 140/100 | HR 111

## 2019-01-30 DIAGNOSIS — F3181 Bipolar II disorder: Secondary | ICD-10-CM

## 2019-01-30 DIAGNOSIS — F900 Attention-deficit hyperactivity disorder, predominantly inattentive type: Secondary | ICD-10-CM | POA: Diagnosis not present

## 2019-01-30 DIAGNOSIS — Z639 Problem related to primary support group, unspecified: Secondary | ICD-10-CM | POA: Diagnosis not present

## 2019-01-30 DIAGNOSIS — F4001 Agoraphobia with panic disorder: Secondary | ICD-10-CM

## 2019-01-30 MED ORDER — QUETIAPINE FUMARATE 25 MG PO TABS: 50.0000 mg | ORAL_TABLET | Freq: Every day | ORAL | 0 refills | Status: DC
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
## 2019-02-13 ENCOUNTER — Telehealth: Payer: Self-pay | Admitting: Psychiatry

## 2019-02-13 ENCOUNTER — Other Ambulatory Visit: Payer: Self-pay | Admitting: Psychiatry

## 2019-02-13 DIAGNOSIS — F900 Attention-deficit hyperactivity disorder, predominantly inattentive type: Secondary | ICD-10-CM

## 2019-02-13 DIAGNOSIS — F988 Other specified behavioral and emotional disorders with onset usually occurring in childhood and adolescence: Secondary | ICD-10-CM

## 2019-02-13 MED ORDER — AMPHETAMINE-DEXTROAMPHETAMINE 30 MG PO TABS: 30.0000 mg | ORAL_TABLET | Freq: Three times a day (TID) | ORAL | 0 refills | Status: DC

## 2019-02-13 NOTE — Progress Notes (Signed)
2 refills until next appointment

## 2019-02-13 NOTE — Telephone Encounter (Signed)
RF of Adderall    [facility] on [address]

## 2019-02-16 ENCOUNTER — Other Ambulatory Visit: Payer: Self-pay | Admitting: Psychiatry

## 2019-02-18 ENCOUNTER — Other Ambulatory Visit: Payer: Self-pay | Admitting: Psychiatry

## 2019-02-18 MED ORDER — CLONAZEPAM 1 MG PO TABS: 1.5000 mg | ORAL_TABLET | Freq: Every day | ORAL | 0 refills | Status: DC

## 2019-03-14 ENCOUNTER — Other Ambulatory Visit: Payer: Self-pay | Admitting: Psychiatry

## 2019-04-02 ENCOUNTER — Ambulatory Visit: Payer: BC Managed Care – PPO | Admitting: Psychiatry

## 2019-04-11 ENCOUNTER — Other Ambulatory Visit: Payer: Self-pay

## 2019-04-11 ENCOUNTER — Telehealth: Payer: Self-pay | Admitting: Psychiatry

## 2019-04-11 DIAGNOSIS — F900 Attention-deficit hyperactivity disorder, predominantly inattentive type: Secondary | ICD-10-CM

## 2019-04-11 MED ORDER — AMPHETAMINE-DEXTROAMPHETAMINE 30 MG PO TABS: 30.0000 mg | ORAL_TABLET | Freq: Three times a day (TID) | ORAL | 0 refills | Status: DC

## 2019-04-11 NOTE — Telephone Encounter (Signed)
Pt needs to reschedule her appt from 05/12, rx will be submitted for 1 month.

## 2019-04-11 NOTE — Telephone Encounter (Signed)
Patient need refill on Adderall 30 mg., 3 tabs a day to be sent to [facility] on [address]

## 2019-04-16 ENCOUNTER — Telehealth: Payer: Self-pay | Admitting: Psychiatry

## 2019-04-16 ENCOUNTER — Other Ambulatory Visit: Payer: Self-pay

## 2019-04-16 MED ORDER — CLONAZEPAM 1 MG PO TABS: 1.5000 mg | ORAL_TABLET | Freq: Every day | ORAL | 0 refills | Status: DC

## 2019-04-16 NOTE — Telephone Encounter (Signed)
Pt called to advise, Pharmacy did not have all of her Adderall refill. Therefore, she will be running out sooner. Stated only got 80 pills refill for 90 pills.

## 2019-04-16 NOTE — Telephone Encounter (Signed)
Last fill 04/27 #45

## 2019-04-16 NOTE — Telephone Encounter (Signed)
Pended for approval.

## 2019-04-16 NOTE — Telephone Encounter (Signed)
Pt called to request refill on Clonazepam. [facility]

## 2019-04-16 NOTE — Telephone Encounter (Signed)
Pt received #[phone] on 04/12/2019, will note for next refill in June.

## 2019-05-08 ENCOUNTER — Other Ambulatory Visit: Payer: Self-pay

## 2019-05-08 ENCOUNTER — Telehealth: Payer: Self-pay | Admitting: Psychiatry

## 2019-05-08 DIAGNOSIS — F988 Other specified behavioral and emotional disorders with onset usually occurring in childhood and adolescence: Secondary | ICD-10-CM

## 2019-05-08 MED ORDER — AMPHETAMINE-DEXTROAMPHETAMINE 30 MG PO TABS: 30.0000 mg | ORAL_TABLET | Freq: Three times a day (TID) | ORAL | 0 refills | Status: DC

## 2019-05-08 NOTE — Telephone Encounter (Signed)
Patient need early refill on Adderall stated she ws only dispensed 82 tabs instead of 90 bc the pharmacy was out of stock

## 2019-05-08 NOTE — Telephone Encounter (Signed)
Will pend for approval and send to last pharmacy noted, [facility]

## 2019-06-06 ENCOUNTER — Telehealth: Payer: Self-pay | Admitting: Psychiatry

## 2019-06-06 ENCOUNTER — Other Ambulatory Visit: Payer: Self-pay

## 2019-06-06 DIAGNOSIS — F900 Attention-deficit hyperactivity disorder, predominantly inattentive type: Secondary | ICD-10-CM

## 2019-06-06 NOTE — Telephone Encounter (Signed)
Pt needs to schedule office visit as well

## 2019-06-06 NOTE — Telephone Encounter (Signed)
Pended for approval.

## 2019-06-06 NOTE — Telephone Encounter (Signed)
Pt needs refill on adderall 30mg  sent to [facility] on [address].

## 2019-06-07 ENCOUNTER — Other Ambulatory Visit: Payer: Self-pay

## 2019-06-07 DIAGNOSIS — F988 Other specified behavioral and emotional disorders with onset usually occurring in childhood and adolescence: Secondary | ICD-10-CM

## 2019-06-07 MED ORDER — AMPHETAMINE-DEXTROAMPHETAMINE 30 MG PO TABS: 30.0000 mg | ORAL_TABLET | Freq: Three times a day (TID) | ORAL | 0 refills | Status: DC

## 2019-06-17 ENCOUNTER — Telehealth: Payer: Self-pay | Admitting: Psychiatry

## 2019-06-17 NOTE — Telephone Encounter (Signed)
Patient need refill on Clonazepam to be sent to [facility] on [address]

## 2019-06-18 ENCOUNTER — Other Ambulatory Visit: Payer: Self-pay

## 2019-06-18 MED ORDER — CLONAZEPAM 1 MG PO TABS: 1.5000 mg | ORAL_TABLET | Freq: Every day | ORAL | 0 refills | Status: DC

## 2019-06-18 NOTE — Telephone Encounter (Signed)
Refill sent, has appt 07/24/2019 Last fill 05/15/2019

## 2019-07-05 ENCOUNTER — Telehealth: Payer: Self-pay | Admitting: Psychiatry

## 2019-07-05 ENCOUNTER — Other Ambulatory Visit: Payer: Self-pay

## 2019-07-05 DIAGNOSIS — F900 Attention-deficit hyperactivity disorder, predominantly inattentive type: Secondary | ICD-10-CM

## 2019-07-05 MED ORDER — AMPHETAMINE-DEXTROAMPHETAMINE 30 MG PO TABS: 30.0000 mg | ORAL_TABLET | Freq: Three times a day (TID) | ORAL | 0 refills | Status: DC

## 2019-07-05 NOTE — Telephone Encounter (Signed)
Pt called to request refill on Adderall @ [facility] on file.

## 2019-07-05 NOTE — Telephone Encounter (Signed)
Pended for approval, last fill 06/08/2019

## 2019-07-09 ENCOUNTER — Other Ambulatory Visit: Payer: Self-pay | Admitting: Psychiatry

## 2019-07-16 ENCOUNTER — Other Ambulatory Visit: Payer: Self-pay | Admitting: Psychiatry

## 2019-07-16 NOTE — Telephone Encounter (Signed)
Patient called and wants a refill on her klonopin 1 mg to be sent to [facility] at [address]

## 2019-07-24 ENCOUNTER — Encounter: Payer: Self-pay | Admitting: Psychiatry

## 2019-07-24 ENCOUNTER — Ambulatory Visit (INDEPENDENT_AMBULATORY_CARE_PROVIDER_SITE_OTHER): Payer: BC Managed Care – PPO | Admitting: Psychiatry

## 2019-07-24 ENCOUNTER — Other Ambulatory Visit: Payer: Self-pay

## 2019-07-24 VITALS — BP 111/80 | HR 86

## 2019-07-24 DIAGNOSIS — F902 Attention-deficit hyperactivity disorder, combined type: Secondary | ICD-10-CM

## 2019-07-24 DIAGNOSIS — F3181 Bipolar II disorder: Secondary | ICD-10-CM

## 2019-07-24 DIAGNOSIS — F900 Attention-deficit hyperactivity disorder, predominantly inattentive type: Secondary | ICD-10-CM

## 2019-07-24 DIAGNOSIS — F4001 Agoraphobia with panic disorder: Secondary | ICD-10-CM

## 2019-07-24 DIAGNOSIS — F5105 Insomnia due to other mental disorder: Secondary | ICD-10-CM

## 2019-07-24 MED ORDER — AMPHETAMINE-DEXTROAMPHETAMINE 30 MG PO TABS: 30.0000 mg | ORAL_TABLET | Freq: Three times a day (TID) | ORAL | 0 refills | Status: DC
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
## 2019-08-13 ENCOUNTER — Other Ambulatory Visit: Payer: Self-pay

## 2019-08-13 ENCOUNTER — Telehealth: Payer: Self-pay | Admitting: Psychiatry

## 2019-08-13 ENCOUNTER — Other Ambulatory Visit: Payer: Self-pay | Admitting: Psychiatry

## 2019-08-13 MED ORDER — CLONAZEPAM 1 MG PO TABS: ORAL_TABLET | ORAL | 4 refills | Status: DC

## 2019-08-13 NOTE — Telephone Encounter (Signed)
Last refill 07/17/2019 due back in 6 months will pend for approval

## 2019-08-13 NOTE — Telephone Encounter (Signed)
Pt requests RF of Klonopin to go to  [facility] on file.

## 2019-08-30 ENCOUNTER — Telehealth: Payer: Self-pay | Admitting: Psychiatry

## 2019-08-30 ENCOUNTER — Other Ambulatory Visit: Payer: Self-pay

## 2019-08-30 DIAGNOSIS — F900 Attention-deficit hyperactivity disorder, predominantly inattentive type: Secondary | ICD-10-CM

## 2019-08-30 DIAGNOSIS — F902 Attention-deficit hyperactivity disorder, combined type: Secondary | ICD-10-CM

## 2019-08-30 NOTE — Telephone Encounter (Signed)
Pended for updated [facility] on [address].

## 2019-08-30 NOTE — Telephone Encounter (Signed)
[facility] just called.  They were filling Ana Best's Adderall prescription but that don't have the brand that Ana Best wants so the prescription needs to be sent to the [facility] on [address] because they have the brand the patient is requesting.  So, please cancell the prescription at the [facility] on [address] and send it to [facility] [address].  She is out and wants is done right now.

## 2019-09-02 NOTE — Telephone Encounter (Signed)
PDMP says Adderall was filled at [facility] on October 9.  This prescription was not sent.  Since she filled the other prescription she will have to wait a month before we send it to a different pharmacy.

## 2019-10-23 ENCOUNTER — Telehealth: Payer: Self-pay | Admitting: Psychiatry

## 2019-10-23 ENCOUNTER — Other Ambulatory Visit: Payer: Self-pay

## 2019-10-23 DIAGNOSIS — F900 Attention-deficit hyperactivity disorder, predominantly inattentive type: Secondary | ICD-10-CM

## 2019-10-23 DIAGNOSIS — F902 Attention-deficit hyperactivity disorder, combined type: Secondary | ICD-10-CM

## 2019-10-23 MED ORDER — AMPHETAMINE-DEXTROAMPHETAMINE 30 MG PO TABS: 30.0000 mg | ORAL_TABLET | Freq: Three times a day (TID) | ORAL | 0 refills | Status: DC

## 2019-10-23 NOTE — Telephone Encounter (Signed)
Pt needs refill on her Adderall 30mg . Please send to [facility] on [facility]

## 2019-10-23 NOTE — Telephone Encounter (Signed)
Last refill 09/27/2019 Pended 3 Rx's for approval Scheduled back in March 2021

## 2019-12-18 ENCOUNTER — Other Ambulatory Visit: Payer: Self-pay

## 2019-12-18 ENCOUNTER — Telehealth: Payer: Self-pay | Admitting: Psychiatry

## 2019-12-18 DIAGNOSIS — F902 Attention-deficit hyperactivity disorder, combined type: Secondary | ICD-10-CM

## 2019-12-18 DIAGNOSIS — F900 Attention-deficit hyperactivity disorder, predominantly inattentive type: Secondary | ICD-10-CM

## 2019-12-18 MED ORDER — AMPHETAMINE-DEXTROAMPHETAMINE 30 MG PO TABS: 30.0000 mg | ORAL_TABLET | Freq: Three times a day (TID) | ORAL | 0 refills | Status: DC

## 2019-12-18 NOTE — Telephone Encounter (Signed)
Pt requested refill for Adderall (Teva brand) others give her headache and Clonazepam refill @ [facility]. Next appt 3/3

## 2019-12-18 NOTE — Telephone Encounter (Signed)
Last refill for Adderall 11/23/2019 Last refill Clonazepam 12/06/2019 #45 for 30 day

## 2020-01-02 ENCOUNTER — Other Ambulatory Visit: Payer: Self-pay

## 2020-01-02 DIAGNOSIS — F902 Attention-deficit hyperactivity disorder, combined type: Secondary | ICD-10-CM

## 2020-01-02 DIAGNOSIS — F900 Attention-deficit hyperactivity disorder, predominantly inattentive type: Secondary | ICD-10-CM

## 2020-01-02 MED ORDER — CLONAZEPAM 1 MG PO TABS: ORAL_TABLET | ORAL | 4 refills | Status: DC

## 2020-01-02 NOTE — Telephone Encounter (Signed)
Clonazepam 1 mg take 1.5 tablets at hs #45, called into [facility]  Adderall is due on 01/17/2020, will pend when appropriate.

## 2020-01-13 ENCOUNTER — Other Ambulatory Visit: Payer: Self-pay

## 2020-01-13 DIAGNOSIS — F902 Attention-deficit hyperactivity disorder, combined type: Secondary | ICD-10-CM

## 2020-01-13 DIAGNOSIS — F900 Attention-deficit hyperactivity disorder, predominantly inattentive type: Secondary | ICD-10-CM

## 2020-01-15 ENCOUNTER — Other Ambulatory Visit: Payer: Self-pay

## 2020-01-15 ENCOUNTER — Telehealth: Payer: Self-pay | Admitting: Psychiatry

## 2020-01-15 DIAGNOSIS — F900 Attention-deficit hyperactivity disorder, predominantly inattentive type: Secondary | ICD-10-CM

## 2020-01-15 DIAGNOSIS — F902 Attention-deficit hyperactivity disorder, combined type: Secondary | ICD-10-CM

## 2020-01-15 NOTE — Telephone Encounter (Signed)
Patient requesting refill a week early.  No early refill.

## 2020-01-15 NOTE — Telephone Encounter (Signed)
Last refill 12/21/2019, pended with fill date 01/18/2020 with TEVA BRAND ONLY Apt 03/03

## 2020-01-15 NOTE — Telephone Encounter (Signed)
Pt would like a new rx for Adderall 30mg  (Brand name ) sent to [facility] on [address]. She transferred from [facility] on [address].

## 2020-01-17 ENCOUNTER — Other Ambulatory Visit: Payer: Self-pay | Admitting: Psychiatry

## 2020-01-17 DIAGNOSIS — F900 Attention-deficit hyperactivity disorder, predominantly inattentive type: Secondary | ICD-10-CM

## 2020-01-17 DIAGNOSIS — F902 Attention-deficit hyperactivity disorder, combined type: Secondary | ICD-10-CM

## 2020-01-17 MED ORDER — AMPHETAMINE-DEXTROAMPHETAMINE 30 MG PO TABS: 30.0000 mg | ORAL_TABLET | Freq: Three times a day (TID) | ORAL | 0 refills | Status: DC

## 2020-01-17 NOTE — Telephone Encounter (Signed)
Adderall prescription sent.  Please be aware patient has been seeking Adderall prescriptions early.  There appears to be a pattern

## 2020-01-17 NOTE — Telephone Encounter (Signed)
Patient called again today to find out what was going on and why her adderall brand name teeve to [facility] on [address] has not been sent to the new pharmacy. She wouldlike to have it by the weekend

## 2020-01-17 NOTE — Telephone Encounter (Signed)
Pt has called requesting a refill on her RX. Instead of ADDERALL she wants TEEVA. Please send to [facility] on [address].

## 2020-01-17 NOTE — Progress Notes (Signed)
Watch refills on controlled substances.  Patient has been requesting Adderall prescriptions early.

## 2020-01-21 ENCOUNTER — Other Ambulatory Visit: Payer: Self-pay

## 2020-01-21 DIAGNOSIS — F902 Attention-deficit hyperactivity disorder, combined type: Secondary | ICD-10-CM

## 2020-01-21 DIAGNOSIS — F900 Attention-deficit hyperactivity disorder, predominantly inattentive type: Secondary | ICD-10-CM

## 2020-01-21 MED ORDER — AMPHETAMINE-DEXTROAMPHETAMINE 30 MG PO TABS: 30.0000 mg | ORAL_TABLET | Freq: Three times a day (TID) | ORAL | 0 refills | Status: DC

## 2020-01-22 ENCOUNTER — Ambulatory Visit: Payer: BC Managed Care – PPO | Admitting: Psychiatry

## 2020-02-17 ENCOUNTER — Telehealth: Payer: Self-pay | Admitting: Psychiatry

## 2020-02-17 ENCOUNTER — Other Ambulatory Visit: Payer: Self-pay | Admitting: Psychiatry

## 2020-02-17 DIAGNOSIS — F902 Attention-deficit hyperactivity disorder, combined type: Secondary | ICD-10-CM

## 2020-02-17 DIAGNOSIS — F900 Attention-deficit hyperactivity disorder, predominantly inattentive type: Secondary | ICD-10-CM

## 2020-02-17 MED ORDER — AMPHETAMINE-DEXTROAMPHETAMINE 30 MG PO TABS: 30.0000 mg | ORAL_TABLET | Freq: Three times a day (TID) | ORAL | 0 refills | Status: DC

## 2020-02-17 NOTE — Telephone Encounter (Signed)
Ana Best called to request refill of her Adderall.  Appt 03/03/20.  Send to [facility] on [address].

## 2020-02-17 NOTE — Telephone Encounter (Signed)
Prescription sent

## 2020-03-03 ENCOUNTER — Ambulatory Visit (INDEPENDENT_AMBULATORY_CARE_PROVIDER_SITE_OTHER): Payer: Self-pay | Admitting: Psychiatry

## 2020-03-03 ENCOUNTER — Encounter: Payer: Self-pay | Admitting: Psychiatry

## 2020-03-03 ENCOUNTER — Other Ambulatory Visit: Payer: Self-pay

## 2020-03-03 DIAGNOSIS — F902 Attention-deficit hyperactivity disorder, combined type: Secondary | ICD-10-CM

## 2020-03-03 DIAGNOSIS — F5105 Insomnia due to other mental disorder: Secondary | ICD-10-CM

## 2020-03-03 DIAGNOSIS — F3181 Bipolar II disorder: Secondary | ICD-10-CM

## 2020-03-03 DIAGNOSIS — F4001 Agoraphobia with panic disorder: Secondary | ICD-10-CM

## 2020-03-03 MED ORDER — AMPHETAMINE-DEXTROAMPHETAMINE 30 MG PO TABS: 30.0000 mg | ORAL_TABLET | Freq: Three times a day (TID) | ORAL | 0 refills | Status: DC
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
## 2020-05-22 ENCOUNTER — Telehealth: Payer: Self-pay | Admitting: Psychiatry

## 2020-05-22 ENCOUNTER — Other Ambulatory Visit: Payer: Self-pay

## 2020-05-22 DIAGNOSIS — F902 Attention-deficit hyperactivity disorder, combined type: Secondary | ICD-10-CM

## 2020-05-22 MED ORDER — AMPHETAMINE-DEXTROAMPHETAMINE 30 MG PO TABS: 30.0000 mg | ORAL_TABLET | Freq: Three times a day (TID) | ORAL | 0 refills | Status: DC

## 2020-05-22 MED ORDER — CLONAZEPAM 1 MG PO TABS: ORAL_TABLET | ORAL | 4 refills | Status: DC

## 2020-05-22 NOTE — Telephone Encounter (Signed)
Last refill clonazepam 06/07 Last refill adderall 06/08 Pending for Dr. Jennelle Human to send

## 2020-05-22 NOTE — Telephone Encounter (Signed)
Ana Best called to request refill of her Adderall and clonazepam.  Appt 09/03/20.  Send to [facility] on [address]

## 2020-05-24 ENCOUNTER — Emergency Department (HOSPITAL_COMMUNITY)
Admission: EM | Admit: 2020-05-24 | Discharge: 2020-05-25 | Disposition: A | Discharge status: Home / Self Care | Payer: Medicaid Other | Attending: Emergency Medicine | Admitting: Emergency Medicine

## 2020-05-24 ENCOUNTER — Encounter (HOSPITAL_COMMUNITY): Payer: Self-pay | Admitting: Emergency Medicine

## 2020-05-24 ENCOUNTER — Other Ambulatory Visit: Payer: Self-pay

## 2020-05-24 DIAGNOSIS — R456 Violent behavior: Secondary | ICD-10-CM | POA: Diagnosis not present

## 2020-05-24 DIAGNOSIS — F319 Bipolar disorder, unspecified: Secondary | ICD-10-CM | POA: Insufficient documentation

## 2020-05-24 DIAGNOSIS — R4689 Other symptoms and signs involving appearance and behavior: Secondary | ICD-10-CM

## 2020-05-24 DIAGNOSIS — Z20822 Contact with and (suspected) exposure to covid-19: Secondary | ICD-10-CM | POA: Diagnosis not present

## 2020-05-24 DIAGNOSIS — F17210 Nicotine dependence, cigarettes, uncomplicated: Secondary | ICD-10-CM | POA: Diagnosis not present

## 2020-05-24 DIAGNOSIS — Z046 Encounter for general psychiatric examination, requested by authority: Secondary | ICD-10-CM | POA: Diagnosis present

## 2020-05-24 DIAGNOSIS — I10 Essential (primary) hypertension: Secondary | ICD-10-CM | POA: Diagnosis not present

## 2020-05-24 NOTE — ED Notes (Signed)
Pt changed into wine scrubs, wanded by security, and belongings placed in the cabinet labeled, "patient belongings [address]
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
## 2020-05-25 ENCOUNTER — Other Ambulatory Visit: Payer: Self-pay | Admitting: Behavioral Health

## 2020-05-25 ENCOUNTER — Inpatient Hospital Stay (HOSPITAL_COMMUNITY)
Admission: EM | Admit: 2020-05-25 | Discharge: 2020-05-29 | DRG: 885 | Disposition: A | Discharge status: Home / Self Care | Payer: Medicaid Other | Source: Intra-hospital | Attending: Psychiatry | Admitting: Psychiatry

## 2020-05-25 DIAGNOSIS — R456 Violent behavior: Secondary | ICD-10-CM | POA: Diagnosis not present

## 2020-05-25 DIAGNOSIS — I10 Essential (primary) hypertension: Secondary | ICD-10-CM | POA: Diagnosis present

## 2020-05-25 DIAGNOSIS — G4700 Insomnia, unspecified: Secondary | ICD-10-CM | POA: Diagnosis present

## 2020-05-25 DIAGNOSIS — F419 Anxiety disorder, unspecified: Secondary | ICD-10-CM | POA: Diagnosis present

## 2020-05-25 DIAGNOSIS — F19959 Other psychoactive substance use, unspecified with psychoactive substance-induced psychotic disorder, unspecified: Secondary | ICD-10-CM | POA: Diagnosis present

## 2020-05-25 DIAGNOSIS — Z20822 Contact with and (suspected) exposure to covid-19: Secondary | ICD-10-CM | POA: Diagnosis present

## 2020-05-25 DIAGNOSIS — R45851 Suicidal ideations: Secondary | ICD-10-CM | POA: Diagnosis present

## 2020-05-25 DIAGNOSIS — F3181 Bipolar II disorder: Secondary | ICD-10-CM | POA: Diagnosis present

## 2020-05-25 DIAGNOSIS — F1520 Other stimulant dependence, uncomplicated: Secondary | ICD-10-CM

## 2020-05-25 DIAGNOSIS — F329 Major depressive disorder, single episode, unspecified: Secondary | ICD-10-CM

## 2020-05-25 DIAGNOSIS — F909 Attention-deficit hyperactivity disorder, unspecified type: Secondary | ICD-10-CM | POA: Diagnosis present

## 2020-05-25 DIAGNOSIS — F17210 Nicotine dependence, cigarettes, uncomplicated: Secondary | ICD-10-CM | POA: Diagnosis present

## 2020-05-25 LAB — RAPID URINE DRUG SCREEN, HOSP PERFORMED
Amphetamines: POSITIVE — AB
Barbiturates: NOT DETECTED
Benzodiazepines: POSITIVE — AB
Cocaine: NOT DETECTED
Opiates: NOT DETECTED
Tetrahydrocannabinol: POSITIVE — AB

## 2020-05-25 LAB — COMPREHENSIVE METABOLIC PANEL
ALT: 36 U/L (ref 0–44)
AST: 44 U/L — ABNORMAL HIGH (ref 15–41)
Albumin: 4.4 g/dL (ref 3.5–5.0)
Alkaline Phosphatase: 39 U/L (ref 38–126)
Anion gap: 11 (ref 5–15)
BUN: 11 mg/dL (ref 6–20)
CO2: 23 mmol/L (ref 22–32)
Calcium: 9.1 mg/dL (ref 8.9–10.3)
Chloride: 104 mmol/L (ref 98–111)
Creatinine, Ser: 0.88 mg/dL (ref 0.44–1.00)
GFR calc Af Amer: >60 mL/min (ref >60)
GFR calc non Af Amer: >60 mL/min (ref >60)
Glucose, Bld: 98 mg/dL (ref 70–99)
Potassium: 3.2 mmol/L — ABNORMAL LOW (ref 3.5–5.1)
Sodium: 138 mmol/L (ref 135–145)
Total Bilirubin: 0.8 mg/dL (ref 0.3–1.2)
Total Protein: 7.4 g/dL (ref 6.5–8.1)

## 2020-05-25 LAB — CBC
HCT: 35.6 % — ABNORMAL LOW (ref 36.0–46.0)
Hemoglobin: 11.9 g/dL — ABNORMAL LOW (ref 12.0–15.0)
MCH: 29.6 pg (ref 26.0–34.0)
MCHC: 33.4 g/dL (ref 30.0–36.0)
MCV: 88.6 fL (ref 80.0–100.0)
Platelets: 337 10*3/uL (ref 150–400)
RBC: 4.02 MIL/uL (ref 3.87–5.11)
RDW: 11.9 % (ref 11.5–15.5)
WBC: 8.3 10*3/uL (ref 4.0–10.5)
nRBC: 0 % (ref 0.0–0.2)

## 2020-05-25 LAB — HCG, QUANTITATIVE, PREGNANCY: hCG, Beta Chain, Quant, S: <1 m[IU]/mL (ref <5)

## 2020-05-25 LAB — ACETAMINOPHEN LEVEL: Acetaminophen (Tylenol), Serum: <10 ug/mL — ABNORMAL LOW (ref 10–30)

## 2020-05-25 LAB — ETHANOL: Alcohol, Ethyl (B): <10 mg/dL (ref <10)

## 2020-05-25 LAB — SARS CORONAVIRUS 2 BY RT PCR (HOSPITAL ORDER, PERFORMED IN ~~LOC~~ HOSPITAL LAB): SARS Coronavirus 2: NEGATIVE

## 2020-05-25 LAB — SALICYLATE LEVEL: Salicylate Lvl: <7 mg/dL — ABNORMAL LOW (ref 7.0–30.0)

## 2020-05-25 MED ORDER — ZIPRASIDONE MESYLATE 20 MG IM SOLR: 20.0000 mg | INTRAMUSCULAR | Status: DC | PRN

## 2020-05-25 MED ORDER — LORAZEPAM 1 MG PO TABS: 1.0000 mg | ORAL_TABLET | ORAL | Status: DC | PRN

## 2020-05-25 MED ORDER — LURASIDONE HCL 40 MG PO TABS
40.0000 mg | ORAL_TABLET | Freq: Every day | ORAL | Status: DC
  Administered 2020-05-25: 40 mg via ORAL
  Filled 2020-05-25 (×3): qty 1

## 2020-05-25 MED ORDER — CLONAZEPAM 1 MG PO TABS: 1.0000 mg | ORAL_TABLET | Freq: Three times a day (TID) | ORAL | Status: DC | PRN

## 2020-05-25 MED ORDER — ACETAMINOPHEN 325 MG PO TABS: 650.0000 mg | ORAL_TABLET | Freq: Four times a day (QID) | ORAL | Status: DC | PRN

## 2020-05-25 MED ORDER — RISPERIDONE 2 MG PO TBDP: 2.0000 mg | ORAL_TABLET | Freq: Three times a day (TID) | ORAL | Status: DC | PRN

## 2020-05-25 MED ORDER — ALUM & MAG HYDROXIDE-SIMETH 200-200-20 MG/5ML PO SUSP: 30.0000 mL | ORAL | Status: DC | PRN

## 2020-05-25 MED ORDER — HYDROXYZINE HCL 25 MG PO TABS
25.0000 mg | ORAL_TABLET | Freq: Three times a day (TID) | ORAL | Status: DC | PRN
  Administered 2020-05-25: 25 mg via ORAL
  Filled 2020-05-25 (×2): qty 1

## 2020-05-25 MED ORDER — TRAZODONE HCL 50 MG PO TABS
50.0000 mg | ORAL_TABLET | Freq: Every evening | ORAL | Status: DC | PRN
  Filled 2020-05-25: qty 1

## 2020-05-25 MED ORDER — MAGNESIUM HYDROXIDE 400 MG/5ML PO SUSP: 30.0000 mL | Freq: Every day | ORAL | Status: DC | PRN

## 2020-05-25 NOTE — ED Notes (Addendum)
Report given to [facility] RN on [address], patient will be going to room 307-1. [employer] called for patient transport.
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
## 2020-05-25 NOTE — [facility] Group Notes (Signed)
Pt did not attend wrap up group this evening. Pt was in bed sleeping.  
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
## 2020-05-25 NOTE — Consult Note (Signed)
  Tele consultation note  Patient continues to have affective instability, reports that she is no longer hearing and seeing things but does believe Alexa is a real person.  Patient also has been using methamphetamine, marijuana daily, last use 05/24/2020.  Patient reports that she is hopeless, worthless, has crying spells, is experiencing suicidal ideation, is not sure she can keep herself safe.  Patient needs inpatient psychiatric admission for stabilization and treatment.  Patient has been accepted to [facility] to a 300 hall bed

## 2020-05-25 NOTE — ED Notes (Signed)
Visitor at bedside.

## 2020-05-25 NOTE — Progress Notes (Signed)
Patient asleep in her bed.  Respirations even and unlabored.  No signs/symptoms of pain/distress noted on patient's face/body movements.  Several staff members have attempted to ask patient questions so her admission paperwork can be completed, but patient refuses.

## 2020-05-25 NOTE — Progress Notes (Signed)
Pt remained in room majority of the evening, minimal interaction was during room environmental checks and pt was irritable with minimal information .    05/25/20 2300  Psych Admission Type (Psych Patients Only)  Admission Status Voluntary  Psychosocial Assessment  Patient Complaints Isolation;Anxiety  Eye Contact Brief  Facial Expression Sad;Angry  Affect Angry  Speech Argumentative  Interaction Minimal  Motor Activity Lethargic  Appearance/Hygiene Disheveled  Behavior Characteristics Agressive verbally  Mood Irritable  Aggressive Behavior  Effect No apparent injury  Thought Process  Coherency WDL  Content WDL  Delusions None reported or observed  Perception WDL  Hallucination None reported or observed  Judgment Poor  Confusion WDL  Danger to Self  Current suicidal ideation? Denies  Danger to Others  Danger to Others None reported or observed

## 2020-05-25 NOTE — Progress Notes (Signed)
Patient is 34 yr old female, involuntary by her mother.  [employer] brought her to [facility].   Patient would not answer any questions during admission, would not allow RN to place name band on her wrist.  Patient brought back to adult unit.  Patient was given food/drink.  Patient sat in hall way crying while staff attempted to calm her.  Patient did take her medications from another nurse.   Respirations even and unlabored.  No signs/symptoms of pain noted on patient's face/body movements.  Safety maintained with 15 minute checks.  Patient did eat dinner in her room.
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
## 2020-05-26 MED ORDER — RISPERIDONE 2 MG PO TBDP
2.0000 mg | ORAL_TABLET | Freq: Every day | ORAL | Status: DC
  Administered 2020-05-27 – 2020-05-28 (×2): 2 mg via ORAL
  Filled 2020-05-26 (×2): qty 1
  Filled 2020-05-26: qty 7
  Filled 2020-05-26 (×2): qty 1

## 2020-05-26 MED ORDER — RISPERIDONE 1 MG PO TBDP
1.0000 mg | ORAL_TABLET | Freq: Every day | ORAL | Status: DC
  Administered 2020-05-26 – 2020-05-28 (×3): 1 mg via ORAL
  Filled 2020-05-26 (×5): qty 1

## 2020-05-26 MED ORDER — CLONAZEPAM 0.5 MG PO TABS: 0.5000 mg | ORAL_TABLET | Freq: Three times a day (TID) | ORAL | Status: DC | PRN
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
## 2020-05-26 NOTE — Plan of Care (Signed)
Progress note  D: pt found in bed; noncompliant with morning assessment and medication pass. Pt awoke at lunch time and seemed confused and disorganized. Pt was compliant with medication administration, but seemed suspicious with education. Pt continued to pace afterward and was trying to elope through the main door. Pt was redirected successfully. Pt now in group, but doesn't seem to be programming with their eyes closed. This Clinical research associate spoke with their mother. The mother was asking for updates from the MD/Social Worker. This writer was unable to provide updates because the pt still refuses to sign paperwork, seeming paranoid with the process and education provided. Pt denies si/hi/ah/vh and verbally agrees to approach staff if these become apparent or before harming themself/others while at [facility].  A: Pt provided support and encouragement. Pt given medication per protocol and standing orders. Q61m safety checks implemented and continued.  R: Pt safe on the unit. Will continue to monitor.  Pt progressing in the following metrics  Problem: Coping: Goal: Will verbalize feelings Outcome: Progressing   Problem: Health Behavior/Discharge Planning: Goal: Ability to make decisions will improve Outcome: Progressing Goal: Compliance with therapeutic regimen will improve Outcome: Progressing
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
## 2020-05-26 NOTE — BHH Counselor (Signed)
Spoke with pt's mother regarding concerns with pt care. Writer did not give any pt information but obtained information from mother, Flossie Dibble. Ms. Olena Leatherwood is concerned that the pt is psychotic and unable to make decisions and Ms. Olena Leatherwood wants to be part of treatment plan, and stated that pt refused to sign release of information form. Writer went to unit and introduced self to pt and told her that her mother was concerned and wanted to be part of treatment plan in order to advocate for the best treatment, and that release of information would not mean that Ms. Olena Leatherwood would not be able to override pt decisions. Pt consented to ROI for her mother, and signed form was placed in pt's chart. Ms. Olena Leatherwood made aware and encouraged to contact pt's MD and RN for specifics.

## 2020-05-26 NOTE — Clinical Social Work Note (Signed)
Attempted to interview patient.  She was in room in bed, complaining of lethargy and not wanting to speak with me.. Irritable when pushed to interact.  Will try again later.

## 2020-05-26 NOTE — Progress Notes (Signed)
Psychoeducational Group Note  Date:  05/26/2020 Time:  2150  Group Topic/Focus:  Wrap-Up Group:   The focus of this group is to help patients review their daily goal of treatment and discuss progress on daily workbooks.  Participation Level: Did Not Attend  Participation Quality:  Not Applicable  Affect:  Not Applicable  Cognitive:  Not Applicable  Insight:  Not Applicable  Engagement in Group: Not Applicable  Additional Comments:  The patient did not attend group since she was asleep.   [name] S 05/26/2020, 9:50 PM

## 2020-05-27 DIAGNOSIS — F19959 Other psychoactive substance use, unspecified with psychoactive substance-induced psychotic disorder, unspecified: Secondary | ICD-10-CM

## 2020-05-27 DIAGNOSIS — F1520 Other stimulant dependence, uncomplicated: Secondary | ICD-10-CM
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
## 2020-05-27 NOTE — Progress Notes (Signed)
   05/27/20 0628  Psych Admission Type (Psych Patients Only)  Admission Status Involuntary  Psychosocial Assessment  Patient Complaints Worrying;Isolation  Eye Contact Brief  Facial Expression Anxious;Pensive;Worried  Affect Anxious  Speech Logical/coherent  Interaction Cautious;Guarded;Minimal  Motor Activity Other (Comment) (WNL)  Appearance/Hygiene Unremarkable;In scrubs  Behavior Characteristics Cooperative;Guarded  Mood Anxious;Pleasant  Thought Process  Coherency Disorganized  Content Preoccupation  Delusions None reported or observed  Perception WDL  Hallucination None reported or observed  Judgment Poor  Confusion None  Danger to Self  Current suicidal ideation? Denies  Danger to Others  Danger to Others None reported or observed   Pt slept the entire shift. Spoke with pt this morning. Pt concerned about a phone call she might have missed. Assured pt that she could ask day shift about using iPad to make a FaceTime call later in the morning.

## 2020-05-27 NOTE — Progress Notes (Signed)
[facility] MD Progress Note  05/27/2020 1:20 PM Ana Best  MRN:  [id number] Subjective:  Patient reports she is feeling better. Currently denies psychotic symptoms. Denies medication side effects other than some sedation. Denies SI. Objective : I have discussed case with treatment team and have also met with patient. She has also been seen by Dr. Rosita Kea   34 year old female, lives with mother, has a 94 year old child, presented to hospital on 7/5 under IVC due to psychotic symptoms ( visual , auditory hallucinations, paranoia, disorganized and aggressive behaviors). 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 ( She was also prescribed Adderall for ADHD ). Admission UDS was positive for amphetamines, BZD ( was prescribed Klonopin) , and Cannabis.    Today patient reports she is feeling better. At this time she denies psychotic symptoms and does not appear internally preoccupied . No delusions are currently expressed. She states " I know I was out of it, really confused, but I am better now". States her memory of events leading to admission is fragmented/ limited.  At this time presents alert, attentive, polite/cooperative, calm and without psychomotor agitation. Affect initially anxious but improves gradually during session. She denies SI, and is hopeful to discharge soon.  With her express consent I spoke with patient's mother via phone. Mother corroborates that patient appears to be improving and stabilizing , although noted that yesterday evening when they communicated via facetime patient appeared haggard and sedated . Mother states she suspects patient had been abusing Adderall, but not Klonopin as this medication bottle is still almost full at home.  Patient describes some sedation related to Risperidone trial, but otherwise denies medication side effects. Currently presents alert, attentive.  Denies SI.     Principal Problem: <principal problem not specified> Diagnosis: Active  Problems:   MDD (major depressive disorder)   Bipolar 2 disorder (HCC)  Total Time spent with patient: 20 minutes  Past Psychiatric History: ADHD, Bipolar Disorder, Anxiety  Past Medical History:  Past Medical History:  Diagnosis Date  . Anxiety   . Bipolar 1 disorder ([place])   . Depression   . Hypertension     Past Surgical History:  Procedure Laterality Date  . DILATION AND CURETTAGE OF UTERUS    . TONSILLECTOMY    . WISDOM TOOTH EXTRACTION     Family History:  Family History  Problem Relation Age of Onset  . Depression Father   . Anxiety disorder Father   . ADD / ADHD Brother    Family Psychiatric  History: None Social History:  Social History   Substance and Sexual Activity  Alcohol Use Yes  . Alcohol/week: 0.0 standard drinks   Comment: drinks- 3-4 beers     Social History   Substance and Sexual Activity  Drug Use No    Social History   Socioeconomic History  . Marital status: Single    Spouse name: Not on file  . Number of children: Not on file  . Years of education: Not on file  . Highest education level: Not on file  Occupational History  . Not on file  Tobacco Use  . Smoking status: Current Every Day Smoker    Packs/day: 0.50    Years: 10.00    Pack years: 5.00    Types: Cigarettes    Last attempt to quit: 03/08/2014    Years since quitting: 6.2  . Smokeless tobacco: Never Used  Vaping Use  . Vaping Use: Never used  Substance and Sexual Activity  .  Alcohol use: Yes    Alcohol/week: 0.0 standard drinks    Comment: drinks- 3-4 beers  . Drug use: No  . Sexual activity: Yes    Partners: Male    Comment: number of sex partners in the last 59 months  1  Other Topics Concern  . Not on file  Social History Narrative  . Not on file   Social Determinants of Health   Financial Resource Strain:   . Difficulty of Paying Living Expenses:   Food Insecurity:   . Worried About [name] in the Last Year:   . Arboriculturist in the Last  Year:   Transportation Needs:   . Film/video editor (Medical):   Marland Kitchen Lack of Transportation (Non-Medical):   Physical Activity:   . Days of Exercise per Week:   . Minutes of Exercise per Session:   Stress:   . Feeling of Stress :   Social Connections:   . Frequency of Communication with Friends and Family:   . Frequency of Social Gatherings with Friends and Family:   . Attends Religious Services:   . Active Member of Clubs or Organizations:   . Attends Archivist Meetings:   Marland Kitchen Marital Status:    Additional Social History:   Sleep: Good  Appetite:  Good  Current Medications: Current Facility-Administered Medications  Medication Dose Route Frequency Provider Last Rate Last Admin  . acetaminophen (TYLENOL) tablet 650 mg  650 mg Oral Q6H PRN Sharma Covert, MD      . alum & mag hydroxide-simeth (MAALOX/MYLANTA) 200-200-20 MG/5ML suspension 30 mL  30 mL Oral Q4H PRN Sharma Covert, MD      . clonazePAM Bobbye [place]) tablet 1 mg  1 mg Oral TID PRN Sharma Covert, MD      . hydrOXYzine (ATARAX/VISTARIL) tablet 25 mg  25 mg Oral TID PRN Sharma Covert, MD   25 mg at 05/25/20 1733  . risperiDONE (RISPERDAL M-TABS) disintegrating tablet 2 mg  2 mg Oral Q8H PRN Sharma Covert, MD       And  . LORazepam (ATIVAN) tablet 1 mg  1 mg Oral PRN Sharma Covert, MD       And  . ziprasidone (GEODON) injection 20 mg  20 mg Intramuscular PRN Sharma Covert, MD      . magnesium hydroxide (MILK OF MAGNESIA) suspension 30 mL  30 mL Oral Daily PRN Sharma Covert, MD      . risperiDONE (RISPERDAL M-TABS) disintegrating tablet 1 mg  1 mg Oral Daily Sharma Covert, MD   1 mg at 05/27/20 0820  . risperiDONE (RISPERDAL M-TABS) disintegrating tablet 2 mg  2 mg Oral QHS Sharma Covert, MD      . traZODone (DESYREL) tablet 50 mg  50 mg Oral QHS PRN Sharma Covert, MD        Lab Results: No results found for this or any previous visit (from the past 48  hour(s)).  Blood Alcohol level:  Lab Results  Component Value Date   ETH <10 05/24/2020   ETH <11 09/73/5329    Metabolic Disorder Labs: No results found for: HGBA1C, MPG No results found for: PROLACTIN No results found for: CHOL, TRIG, HDL, CHOLHDL, VLDL, LDLCALC  Physical Findings: AIMS: Facial and Oral Movements Muscles of Facial Expression: None, normal Lips and Perioral Area: None, normal Jaw: None, normal Tongue: None, normal,Extremity Movements Upper (arms, wrists, hands, fingers): None, normal Lower (  legs, knees, ankles, toes): None, normal, Trunk Movements Neck, shoulders, hips: None, normal, Overall Severity Severity of abnormal movements (highest score from questions above): None, normal Incapacitation due to abnormal movements: None, normal Patient's awareness of abnormal movements (rate only patient's report): No Awareness, Dental Status Current problems with teeth and/or dentures?: No Does patient usually wear dentures?: No  CIWA:    COWS:     Musculoskeletal: Strength & Muscle Tone: within normal limits Gait & Station: normal Patient leans: N/A  Psychiatric Specialty Exam: Physical Exam  Review of Systems denies headache, no chest pain or shortness of breath at room air, no vomiting   Blood pressure 120/79, pulse 78, temperature 98 F (36.7 C), temperature source Oral, resp. rate 18, height '5\' 6"'$  (1.676 m), weight 69.9 kg.Body mass index is 24.86 kg/m.  General Appearance: Casual  Eye Contact:  Fair, improves during session  Speech:  Normal Rate  Volume:  Normal  Mood:  reports mood is better and "OK", does not endorse depression  Affect:  vaguely anxious, which tends to improve during session, smiles appropriately at times during session  Thought Process:  Linear, associations intact at [facility] time  Orientation:  Full (Time, Place, and Person)- she is currently alert, and attentive and fully oriented   Thought Content:  currently denies hallucinations,  does not appear internally preoccupied, no delusions are currently noted   Suicidal Thoughts:  No denies suicidal or self injurious ideations, denies homicidal or violent ideations and specifically also denies any violent ideations towards her mother  Homicidal Thoughts:  No  Memory:  recent and remote fair   Judgement:  Fair/ improving   Insight:  Fair  Psychomotor Activity:  Normal  Concentration:  Concentration: improving  and Attention Span: improving   Recall:  Good  Fund of Knowledge:  Good  Language:  Good  Akathisia:  No  Handed:  Left  AIMS (if indicated):     Assets:  Desire for Improvement  ADL's:  Intact  Cognition:  WNL  Sleep:  Number of Hours: 6.75   Assessment - 34 year old female, lives with mother, has a 31 year old child, presented to hospital on 7/5 under IVC due to psychotic symptoms ( visual , auditory hallucinations, paranoia, disorganized and aggressive behaviors). 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 ( She was also prescribed Adderall for ADHD ). Admission UDS was positive for amphetamines, BZD ( was prescribed Klonopin) , and Cannabis.   Today patient presents with improvement compared to admission and denies /does not present with current psychotic symptoms . She minimizes depression or significant neuro-vegetative symptoms at this time. Her mother acknowledges improvement and states patient can return home to live with her as long as she continues to stabilize and psychotic symptoms have resolved. Patient is currently on Risperidone trial, and endorses some sedation, although currently presents fully alert and attentive/ oriented x 3 .   Treatment Plan Summary:    Daily contact with patient to assess and evaluate symptoms and progress in treatment. Treatment Plan reviewed as below today 7/7 Encourage efforts to work on sobriety and relapse prevention Encourage group and milieu participation Treatment team working on disposition planning  options Continue Risperidone '1mg'$  PO daily and '2mg'$  QHS for psychosis Continue Trazodone 50 mgrs QHS PRN for insomnia.  Continue Vistaril 25 mgrs TID PRN for anxiety Continue agitation protocol for acute agitation as needed  Continue Clonazepam '1mg'$  PO 6 hourly as needed if CIWA >10 Check Lipid Panel, HgbA1C as on  Risperidone . Will also recheck BMP ( was hypokalemic on admission). Check EKG to monitor QTc   Jenne Campus, MD 05/27/2020, 1:20 PM   Patient ID: Doristine Shehan, female   DOB: 03-20-1986, 34 y.o.   MRN: [id number]

## 2020-05-27 NOTE — Plan of Care (Signed)
Patient has been in bed sleeping.Presented to the medication room to receive bedtime medication then returned to bed, reporting that she is feeling tired "the medications are making me tired". Patient was pleasant but  reserved. Reported that she did not feel like talking. Returned to bed and currently sleeping safely.

## 2020-05-27 NOTE — Progress Notes (Signed)

## 2020-05-27 NOTE — Progress Notes (Signed)
D. Pt presents as calm and cooperative, and her thinking is much clearer- pt currently denies SI/HI and A/VH Pt observed in the milieu interacting appropriately with peers A. Labs and vitals monitored. Pt given and educated on medications. Pt supported emotionally and encouraged to express concerns and ask questions.   R. Pt remains safe with 15 minute checks. Will continue POC.
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
## 2020-05-27 NOTE — Tx Team (Signed)
Interdisciplinary Treatment and Diagnostic Plan Update  05/27/2020 Time of Session: 1:30PM Ana Best MRN: [id number]  Principal Diagnosis: <principal problem not specified>  Secondary Diagnoses: Active Problems:   MDD (major depressive disorder)   Bipolar 2 disorder (HCC)   Current Medications:  Current Facility-Administered Medications  Medication Dose Route Frequency Provider Last Rate Last Admin  . acetaminophen (TYLENOL) tablet 650 mg  650 mg Oral Q6H PRN Sharma Covert, MD      . alum & mag hydroxide-simeth (MAALOX/MYLANTA) 200-200-20 MG/5ML suspension 30 mL  30 mL Oral Q4H PRN Sharma Covert, MD      . clonazePAM Bobbye Charleston) tablet 1 mg  1 mg Oral TID PRN Sharma Covert, MD      . hydrOXYzine (ATARAX/VISTARIL) tablet 25 mg  25 mg Oral TID PRN Sharma Covert, MD   25 mg at 05/25/20 1733  . risperiDONE (RISPERDAL M-TABS) disintegrating tablet 2 mg  2 mg Oral Q8H PRN Sharma Covert, MD       And  . LORazepam (ATIVAN) tablet 1 mg  1 mg Oral PRN Sharma Covert, MD       And  . ziprasidone (GEODON) injection 20 mg  20 mg Intramuscular PRN Sharma Covert, MD      . magnesium hydroxide (MILK OF MAGNESIA) suspension 30 mL  30 mL Oral Daily PRN Sharma Covert, MD      . risperiDONE (RISPERDAL M-TABS) disintegrating tablet 1 mg  1 mg Oral Daily Sharma Covert, MD   1 mg at 05/27/20 0820  . risperiDONE (RISPERDAL M-TABS) disintegrating tablet 2 mg  2 mg Oral QHS Sharma Covert, MD      . traZODone (DESYREL) tablet 50 mg  50 mg Oral QHS PRN Sharma Covert, MD       PTA Medications: Medications Prior to Admission  Medication Sig Dispense Refill Last Dose  . amphetamine-dextroamphetamine (ADDERALL) 30 MG tablet Take 1 tablet by mouth 3 (three) times daily. (Patient not taking: Reported on 05/25/2020) 90 tablet 0   . amphetamine-dextroamphetamine (ADDERALL) 30 MG tablet Take 1 tablet by mouth 3 (three) times daily. (Patient not taking: Reported on  05/25/2020) 90 tablet 0   . amphetamine-dextroamphetamine (ADDERALL) 30 MG tablet Take 1 tablet by mouth 3 (three) times daily. 90 tablet 0   . clonazePAM (KLONOPIN) 1 MG tablet TAKE 1 & 1/2 (ONE & ONE-HALF) TABLETS BY MOUTH EVERY DAY AT BEDTIME (Patient taking differently: Take 0.5-1 mg by mouth at bedtime as needed for anxiety. ) 45 tablet 4   . SPRINTEC 28 0.25-35 MG-MCG tablet Take 1 tablet by mouth daily.       Patient Stressors:    Patient Strengths:    Treatment Modalities: Medication Management, Group therapy, Case management,  1 to 1 session with clinician, Psychoeducation, Recreational therapy.   Physician Treatment Plan for Primary Diagnosis: <principal problem not specified> Long Term Goal(s): Improvement in symptoms so as ready for discharge Improvement in symptoms so as ready for discharge   Short Term Goals: Ability to identify changes in lifestyle to reduce recurrence of condition will improve Ability to verbalize feelings will improve Ability to disclose and discuss suicidal ideas Ability to demonstrate self-control will improve Ability to identify and develop effective coping behaviors will improve Ability to maintain clinical measurements within normal limits will improve Compliance with prescribed medications will improve Ability to identify triggers associated with substance abuse/mental health issues will improve Ability to identify changes in lifestyle to reduce  recurrence of condition will improve Ability to verbalize feelings will improve Ability to disclose and discuss suicidal ideas Ability to demonstrate self-control will improve Ability to identify and develop effective coping behaviors will improve Ability to maintain clinical measurements within normal limits will improve Compliance with prescribed medications will improve Ability to identify triggers associated with substance abuse/mental health issues will improve  Medication Management: Evaluate  patient's response, side effects, and tolerance of medication regimen.  Therapeutic Interventions: 1 to 1 sessions, Unit Group sessions and Medication administration.  Evaluation of Outcomes: Not Met  Physician Treatment Plan for Secondary Diagnosis: Active Problems:   MDD (major depressive disorder)   Bipolar 2 disorder (HCC)  Long Term Goal(s): Improvement in symptoms so as ready for discharge Improvement in symptoms so as ready for discharge   Short Term Goals: Ability to identify changes in lifestyle to reduce recurrence of condition will improve Ability to verbalize feelings will improve Ability to disclose and discuss suicidal ideas Ability to demonstrate self-control will improve Ability to identify and develop effective coping behaviors will improve Ability to maintain clinical measurements within normal limits will improve Compliance with prescribed medications will improve Ability to identify triggers associated with substance abuse/mental health issues will improve Ability to identify changes in lifestyle to reduce recurrence of condition will improve Ability to verbalize feelings will improve Ability to disclose and discuss suicidal ideas Ability to demonstrate self-control will improve Ability to identify and develop effective coping behaviors will improve Ability to maintain clinical measurements within normal limits will improve Compliance with prescribed medications will improve Ability to identify triggers associated with substance abuse/mental health issues will improve     Medication Management: Evaluate patient's response, side effects, and tolerance of medication regimen.  Therapeutic Interventions: 1 to 1 sessions, Unit Group sessions and Medication administration.  Evaluation of Outcomes: Not Met   RN Treatment Plan for Primary Diagnosis: <principal problem not specified> Long Term Goal(s): Knowledge of disease and therapeutic regimen to maintain health  will improve  Short Term Goals: Ability to demonstrate self-control, Ability to participate in decision making will improve, Ability to verbalize feelings will improve, Ability to disclose and discuss suicidal ideas, Ability to identify and develop effective coping behaviors will improve and Compliance with prescribed medications will improve  Medication Management: RN will administer medications as ordered by provider, will assess and evaluate patient's response and provide education to patient for prescribed medication. RN will report any adverse and/or side effects to prescribing provider.  Therapeutic Interventions: 1 on 1 counseling sessions, Psychoeducation, Medication administration, Evaluate responses to treatment, Monitor vital signs and CBGs as ordered, Perform/monitor CIWA, COWS, AIMS and Fall Risk screenings as ordered, Perform wound care treatments as ordered.  Evaluation of Outcomes: Not Met   LCSW Treatment Plan for Primary Diagnosis: <principal problem not specified> Long Term Goal(s): Safe transition to appropriate next level of care at discharge, Engage patient in therapeutic group addressing interpersonal concerns.  Short Term Goals: Engage patient in aftercare planning with referrals and resources, Increase social support, Increase ability to appropriately verbalize feelings, Increase emotional regulation and Increase skills for wellness and recovery  Therapeutic Interventions: Assess for all discharge needs, 1 to 1 time with Social worker, Explore available resources and support systems, Assess for adequacy in community support network, Educate family and significant other(s) on suicide prevention, Complete Psychosocial Assessment, Interpersonal group therapy.  Evaluation of Outcomes: Not Met   Progress in Treatment: Attending groups: No. Participating in groups: No. Taking medication as prescribed: Yes. Toleration  medication: Yes. Family/Significant other contact made:  Yes, individual(s) contacted:  once permission is given. Patient understands diagnosis: Yes. Discussing patient identified problems/goals with staff: Yes. Medical problems stabilized or resolved: Yes. Denies suicidal/homicidal ideation: Yes. Issues/concerns per patient self-inventory: No. Other: none  New problem(s) identified: No, Describe:  none  New Short Term/Long Term Goal(s): detox, elimination of symptoms of psychosis, medication management for mood stabilization; elimination of SI thoughts; development of comprehensive mental wellness/sobriety plan.  Patient Goals:  "just to get healthy and get back on track with my medications"  Discharge Plan or Barriers: Patient reports plans to return home and continue with her current mental health provider.  CSW will continue to assist.  Reason for Continuation of Hospitalization: Anxiety Depression Medication stabilization Suicidal ideation  Estimated Length of Stay: 1-7 days  Attendees: Patient: Ana Best 05/27/2020 2:11 PM  Physician: Dr. Mallie Darting, MD 05/27/2020 2:11 PM  Nursing:  05/27/2020 2:11 PM  RN Care Manager: 05/27/2020 2:11 PM  Social Worker: Assunta Curtis, [place] 05/27/2020 2:11 PM  Recreational Therapist:  05/27/2020 2:11 PM  Other: Audree Camel, LCSW 05/27/2020 2:11 PM  Other:  05/27/2020 2:11 PM  Other: 05/27/2020 2:11 PM    Scribe for Treatment Team: Rozann Lesches, LCSW 05/27/2020 2:11 PM

## 2020-05-28 DIAGNOSIS — F19959 Other psychoactive substance use, unspecified with psychoactive substance-induced psychotic disorder, unspecified: Secondary | ICD-10-CM

## 2020-05-28 DIAGNOSIS — F1520 Other stimulant dependence, uncomplicated: Secondary | ICD-10-CM

## 2020-05-28 LAB — LIPID PANEL
Cholesterol: 158 mg/dL (ref 0–200)
HDL: 45 mg/dL (ref >40)
LDL Cholesterol: 89 mg/dL (ref 0–99)
Total CHOL/HDL Ratio: 3.5 RATIO
Triglycerides: 118 mg/dL (ref <150)
VLDL: 24 mg/dL (ref 0–40)

## 2020-05-28 LAB — BASIC METABOLIC PANEL
Anion gap: 14 (ref 5–15)
BUN: 9 mg/dL (ref 6–20)
CO2: 24 mmol/L (ref 22–32)
Calcium: 8.9 mg/dL (ref 8.9–10.3)
Chloride: 100 mmol/L (ref 98–111)
Creatinine, Ser: 0.72 mg/dL (ref 0.44–1.00)
GFR calc Af Amer: >60 mL/min (ref >60)
GFR calc non Af Amer: >60 mL/min (ref >60)
Glucose, Bld: 96 mg/dL (ref 70–99)
Potassium: 3.6 mmol/L (ref 3.5–5.1)
Sodium: 138 mmol/L (ref 135–145)

## 2020-05-28 LAB — HEMOGLOBIN A1C
Hgb A1c MFr Bld: 5.1 % (ref 4.8–5.6)
Mean Plasma Glucose: 99.67 mg/dL

## 2020-05-28 MED ORDER — CLONAZEPAM 0.5 MG PO TABS: 0.5000 mg | ORAL_TABLET | Freq: Two times a day (BID) | ORAL | Status: DC | PRN

## 2020-05-28 MED ORDER — RISPERIDONE 1 MG PO TBDP
2.0000 mg | ORAL_TABLET | Freq: Every day | ORAL | Status: DC
  Filled 2020-05-28 (×3): qty 14

## 2020-05-28 MED ORDER — CLONAZEPAM 0.5 MG PO TABS: 0.5000 mg | ORAL_TABLET | Freq: Three times a day (TID) | ORAL | Status: DC | PRN

## 2020-05-28 NOTE — Progress Notes (Signed)
Pt visible on unit this evening, with bright affect and very talkative with staff. Pt pleasant and very interactive with people on the unit. Pt stated she felt a lot better.    05/28/20 2300  Psych Admission Type (Psych Patients Only)  Admission Status Involuntary  Psychosocial Assessment  Patient Complaints Anxiety;Worrying  Eye Contact Fair  Facial Expression Anxious;Pensive;Worried  Affect Anxious;Preoccupied  Solicitor Cautious;Forwards little;Guarded;Minimal  Motor Activity Fidgety;Slow  Appearance/Hygiene Disheveled;In scrubs  Behavior Characteristics Cooperative  Mood Pleasant;Preoccupied;Anxious  Thought Process  Coherency WDL  Content WDL  Delusions None reported or observed  Perception WDL  Hallucination None reported or observed  Judgment Poor  Confusion None  Danger to Self  Current suicidal ideation? Denies  Danger to Others  Danger to Others None reported or observed

## 2020-05-28 NOTE — Plan of Care (Signed)
Progress note  D: pt found in bed; compliant with medication administration. Pt continues to be minimal and guarded with assessments. Pt's thought process seems to have cleared since last encounter though. Pt is still fidgety and restless. Pt continues to be reclusive to their room. Pt is pleasant. Pt denies si/hi/ah/vh and verbally agrees to approach staff if these become apparent or before harming themself/others while at [facility].  A: Pt provided support and encouragement. Pt given medication per protocol and standing orders. Q68m safety checks implemented and continued.  R: Pt safe on the unit. Will continue to monitor.  Pt progressing in the following metrics  Problem: Coping: Goal: Will verbalize feelings Outcome: Progressing   Problem: Health Behavior/Discharge Planning: Goal: Ability to make decisions will improve Outcome: Progressing Goal: Compliance with therapeutic regimen will improve Outcome: Progressing   Problem: Safety: Goal: Ability to disclose and discuss suicidal ideas will improve Outcome: Progressing
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
## 2020-05-29 MED ORDER — RISPERIDONE 2 MG PO TBDP: 2.0000 mg | ORAL_TABLET | Freq: Every day | ORAL | 0 refills | Status: DC

## 2020-05-29 MED ORDER — CLONAZEPAM 0.5 MG PO TABS: 0.5000 mg | ORAL_TABLET | Freq: Two times a day (BID) | ORAL | 0 refills | Status: DC | PRN

## 2020-05-29 NOTE — [facility] Suicide Risk Assessment (Signed)
[facility] Discharge Suicide Risk Assessment   Principal Problem: Substance-induced psychosis Discharge Diagnoses: Active Problems:   MDD (major depressive disorder)   Bipolar 2 disorder (HCC)   Amphetamine dependence (HCC)   Psychoactive substance-induced psychosis (HCC)   Total Time spent with patient: 30 minutes  Musculoskeletal: Strength & Muscle Tone: within normal limits Gait & Station: normal Patient leans: N/A  Psychiatric Specialty Exam: Review of Systems no headache, no chest pain, no shortness of breath, nausea, no vomiting  Blood pressure 114/81, pulse (!) 126, temperature 97.7 F (36.5 C), temperature source Oral, resp. rate 18, height 5\' 6"  (1.676 m), weight 69.9 kg.Body mass index is 24.86 kg/m.  General Appearance: Improved grooming  Eye Contact::  Good  Speech:  Normal Rate409  Volume:  Normal  Mood:  Improving mood, currently euthymic  Affect:  Appropriate and Full Range  Thought Process:  Linear and Descriptions of Associations: Intact  Orientation:  Full (Time, Place, and Person)  Thought Content:  Currently denies hallucinations and does not appear internally preoccupied. No delusions are currently expressed.  Suicidal Thoughts:  No denies suicidal or self-injurious ideations  Homicidal Thoughts:  No denies any homicidal or violent ideations towards anyone  Memory:  Recent and remote grossly intact  Judgement:  Other:  Improving  Insight:  Fair/improving  Psychomotor Activity:  Normal-no psychomotor agitation or restlessness  Concentration:  Good  Recall:  Good  Fund of Knowledge:Good  Language: Good  Akathisia:  Negative  Handed:  Right  AIMS (if indicated):     Assets:  Communication Skills Desire for Improvement Resilience  Sleep:  Number of Hours: 6.5  Cognition: WNL  ADL's:  Intact   Mental Status Per Nursing Assessment::   On Admission:     Demographic Factors:  34 year old female  Loss Factors: Substance abuse. Patient reported  methamphetamine and cannabis abuse. Mother (with whom I spoke with patient's consent) also reports she suspects patient has been abusing prescribed Adderall.  Historical Factors: Reports history of bipolar disorder and ADD. Has noted, history of stimulant use disorder  Risk Reduction Factors:   Sense of responsibility to family, Living with another person, especially a relative and Positive coping skills or problem solving skills  Continued Clinical Symptoms:  Patient presents alert, attentive, calm, well related, pleasant on approach. Mood is improved and currently euthymic. At this time her thought processes linear and well organized. She denies hallucinations and does not appear internally preoccupied. No delusions are currently expressed. She presents future oriented. Behavior on unit is calm and in good control. She is pleasant on approach. Currently on risperidone-side effects have been reviewed. With her expressed consent I spoke with her mother, who corroborates that patient is doing better. Mother wants patient to stay/live with her for period of time for added support which patient is agreeing to do at this time. Patient is expressing increased/improved insight regarding the likely negative impact that stimulant abuse has had on her mental health. She states she is motivated in sobriety/abstinence. She also states she does not plan to resume or restart Adderall following discharge  Cognitive Features That Contribute To Risk:  No gross cognitive deficits noted upon discharge. Is alert , attentive, and oriented x 3   Suicide Risk:  Mild:  Suicidal ideation of limited frequency, intensity, duration, and specificity.  There are no identifiable plans, no associated intent, mild dysphoria and related symptoms, good self-control (both objective and subjective assessment), few other risk factors, and identifiable protective factors, including available and accessible social  support.    Follow-up Information    [facility] Follow up.   Specialty: Behavioral Health Why: You have an appointment for medication management on   You also have an appointment for therapy on  Contact information: [address] [place] [address] [phone]              Plan Of Care/Follow-up recommendations:  Activity:  As tolerated Diet:  Regular Tests:  NA Other:  See below  Patient is presenting with significant improvement, expressing readiness for discharge, leaving unit in good spirits. Mother will be picking her up later today. Patient plans to live with her mother for period time. Follow-up as above.  Craige Cotta, MD 05/29/2020, 9:13 AM
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
## 2020-05-29 NOTE — [facility] Suicide Risk Assessment (Signed)
[facility] INPATIENT:  Family/Significant Other Suicide Prevention Education  Suicide Prevention Education:  Patient Refusal for Family/Significant Other Suicide Prevention Education: The patient Ana Best has refused to provide written consent for family/significant other to be provided Family/Significant Other Suicide Prevention Education during admission and/or prior to discharge.   Physician notified.  Evorn Gong 05/29/2020, 9:45 AM

## 2020-05-29 NOTE — Plan of Care (Signed)
Discharge Note  Patient verbalizes readiness for discharge. Follow up plan explained, AVS, Transition record and SRA given. Prescriptions and teaching provided. Belongings returned and signed for. Suicide safety plan completed and signed. Patient verbalizes understanding. Patient denies SI/HI and assures this Clinical research associate they will seek assistance should that change. Patient discharged to lobby where mother was waiting.  Problem: Education: Goal: Ability to state activities that reduce stress will improve Outcome: Adequate for Discharge   Problem: Coping: Goal: Ability to identify and develop effective coping behavior will improve Outcome: Adequate for Discharge   Problem: Self-Concept: Goal: Ability to identify factors that promote anxiety will improve Outcome: Adequate for Discharge Goal: Level of anxiety will decrease Outcome: Adequate for Discharge Goal: Ability to modify response to factors that promote anxiety will improve Outcome: Adequate for Discharge   Problem: Education: Goal: Utilization of techniques to improve thought processes will improve Outcome: Adequate for Discharge Goal: Knowledge of the prescribed therapeutic regimen will improve Outcome: Adequate for Discharge   Problem: Activity: Goal: Interest or engagement in leisure activities will improve Outcome: Adequate for Discharge Goal: Imbalance in normal sleep/wake cycle will improve Outcome: Adequate for Discharge   Problem: Coping: Goal: Coping ability will improve Outcome: Adequate for Discharge Goal: Will verbalize feelings Outcome: Adequate for Discharge   Problem: Health Behavior/Discharge Planning: Goal: Ability to make decisions will improve Outcome: Adequate for Discharge Goal: Compliance with therapeutic regimen will improve Outcome: Adequate for Discharge   Problem: Role Relationship: Goal: Will demonstrate positive changes in social behaviors and relationships Outcome: Adequate for Discharge    Problem: Safety: Goal: Ability to disclose and discuss suicidal ideas will improve Outcome: Adequate for Discharge Goal: Ability to identify and utilize support systems that promote safety will improve Outcome: Adequate for Discharge   Problem: Self-Concept: Goal: Will verbalize positive feelings about self Outcome: Adequate for Discharge Goal: Level of anxiety will decrease Outcome: Adequate for Discharge   Problem: Activity: Goal: Will identify at least one activity in which they can participate Outcome: Adequate for Discharge   Problem: Coping: Goal: Ability to identify and develop effective coping behavior will improve Outcome: Adequate for Discharge Goal: Ability to interact with others will improve Outcome: Adequate for Discharge Goal: Demonstration of participation in decision-making regarding own care will improve Outcome: Adequate for Discharge Goal: Ability to use eye contact when communicating with others will improve Outcome: Adequate for Discharge   Problem: Health Behavior/Discharge Planning: Goal: Identification of resources available to assist in meeting health care needs will improve Outcome: Adequate for Discharge   Problem: Self-Concept: Goal: Will verbalize positive feelings about self Outcome: Adequate for Discharge   Problem: Education: Goal: Ability to incorporate positive changes in behavior to improve self-esteem will improve Outcome: Adequate for Discharge   Problem: Health Behavior/Discharge Planning: Goal: Ability to identify and utilize available resources and services will improve Outcome: Adequate for Discharge Goal: Ability to remain free from injury will improve Outcome: Adequate for Discharge   Problem: Self-Concept: Goal: Will verbalize positive feelings about self Outcome: Adequate for Discharge   Problem: Skin Integrity: Goal: Demonstration of wound healing without infection will improve Outcome: Adequate for Discharge    Problem: Activity: Goal: Will identify at least one activity in which they can participate Outcome: Adequate for Discharge   Problem: Coping: Goal: Ability to identify and develop effective coping behavior will improve Outcome: Adequate for Discharge Goal: Ability to interact with others will improve Outcome: Adequate for Discharge Goal: Demonstration of participation in decision-making regarding own care will improve  Outcome: Adequate for Discharge Goal: Ability to use eye contact when communicating with others will improve Outcome: Adequate for Discharge   Problem: Health Behavior/Discharge Planning: Goal: Identification of resources available to assist in meeting health care needs will improve Outcome: Adequate for Discharge   Problem: Self-Concept: Goal: Will verbalize positive feelings about self Outcome: Adequate for Discharge   Problem: Education: Goal: Knowledge of [place] [facility] Education information/materials will improve Outcome: Adequate for Discharge Goal: Emotional status will improve Outcome: Adequate for Discharge Goal: Mental status will improve Outcome: Adequate for Discharge Goal: Verbalization of understanding the information provided will improve Outcome: Adequate for Discharge   Problem: Activity: Goal: Interest or engagement in activities will improve Outcome: Adequate for Discharge Goal: Sleeping patterns will improve Outcome: Adequate for Discharge   Problem: Coping: Goal: Ability to verbalize frustrations and anger appropriately will improve Outcome: Adequate for Discharge Goal: Ability to demonstrate self-control will improve Outcome: Adequate for Discharge   Problem: Health Behavior/Discharge Planning: Goal: Identification of resources available to assist in meeting health care needs will improve Outcome: Adequate for Discharge Goal: Compliance with treatment plan for underlying cause of condition will improve Outcome: Adequate for  Discharge   Problem: Physical Regulation: Goal: Ability to maintain clinical measurements within normal limits will improve Outcome: Adequate for Discharge   Problem: Safety: Goal: Periods of time without injury will increase Outcome: Adequate for Discharge

## 2020-05-29 NOTE — Progress Notes (Signed)
  [facility] Adult Case Management Discharge Plan :  Will you be returning to the same living situation after discharge:  Yes,   patient is returning to her home at [facility] [place]. At discharge, do you have transportation home?: Yes,  patient's mother [name] is providing transportation home for her. Do you have the ability to pay for your medications: Yes,  patient has means to get her medications.  Release of information consent forms completed and in the chart;  Patient's signature needed at discharge.  Patient to Follow up at:  Follow-up Information    [facility]. Go on 06/05/2020.   Specialty: Behavioral Health Why: You have an appointment for therapy on 06/05/20 at 2:00 pm.  This appointment will be held in person, please arrive at 1:30 pm.   You also have an In person appointment for medication management on 06/09/20 at 1:00 pm. Contact information: [address] [place] [address] [phone]              Next level of care provider has access to [facility] Link:yes  Safety Planning and Suicide Prevention discussed: Yes,  yes with patient.     Has patient been referred to the Quitline?: N/A patient is not a smoker  Patient has been referred for addiction treatment: N/A  Evorn Gong, LCSW 05/29/2020, 9:36 AM

## 2020-06-05 ENCOUNTER — Other Ambulatory Visit: Payer: Self-pay

## 2020-06-05 ENCOUNTER — Ambulatory Visit (INDEPENDENT_AMBULATORY_CARE_PROVIDER_SITE_OTHER): Payer: No Payment, Other | Admitting: Clinical

## 2020-06-05 DIAGNOSIS — F3181 Bipolar II disorder: Secondary | ICD-10-CM
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
## 2020-06-09 ENCOUNTER — Encounter (HOSPITAL_COMMUNITY): Payer: Self-pay | Admitting: Psychiatry

## 2020-06-09 ENCOUNTER — Other Ambulatory Visit: Payer: Self-pay

## 2020-06-09 ENCOUNTER — Ambulatory Visit (INDEPENDENT_AMBULATORY_CARE_PROVIDER_SITE_OTHER): Payer: No Payment, Other | Admitting: Psychiatry

## 2020-06-09 DIAGNOSIS — F3341 Major depressive disorder, recurrent, in partial remission: Secondary | ICD-10-CM
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
## 2020-06-22 ENCOUNTER — Other Ambulatory Visit: Payer: Self-pay

## 2020-06-22 ENCOUNTER — Telehealth: Payer: Self-pay | Admitting: Psychiatry

## 2020-06-22 NOTE — Telephone Encounter (Signed)
Put her on cancellation list. °

## 2020-06-22 NOTE — Telephone Encounter (Signed)
Patient had a recent psychiatric hospitalization ( not sure if you were aware) and looks like the Adderall was taken off her medication list.  Her last refill was 05/27/2020  Her next apt is in October

## 2020-06-22 NOTE — Telephone Encounter (Signed)
Pt requesting a refill on her Adderall. Fill at the [facility]. Appt scheduled for 10/14.

## 2020-07-01 ENCOUNTER — Ambulatory Visit (HOSPITAL_COMMUNITY): Payer: No Payment, Other | Admitting: Clinical

## 2020-07-15 ENCOUNTER — Other Ambulatory Visit: Payer: Self-pay

## 2020-07-15 ENCOUNTER — Ambulatory Visit (INDEPENDENT_AMBULATORY_CARE_PROVIDER_SITE_OTHER): Payer: No Payment, Other | Admitting: Psychiatry

## 2020-07-15 DIAGNOSIS — F900 Attention-deficit hyperactivity disorder, predominantly inattentive type: Secondary | ICD-10-CM | POA: Diagnosis not present

## 2020-07-15 DIAGNOSIS — F330 Major depressive disorder, recurrent, mild: Secondary | ICD-10-CM

## 2020-07-15 DIAGNOSIS — F411 Generalized anxiety disorder: Secondary | ICD-10-CM | POA: Diagnosis not present

## 2020-07-15 MED ORDER — CLONAZEPAM 0.5 MG PO TABS: 0.5000 mg | ORAL_TABLET | Freq: Two times a day (BID) | ORAL | 0 refills | Status: DC | PRN

## 2020-07-15 MED ORDER — ESCITALOPRAM OXALATE 10 MG PO TABS: 10.0000 mg | ORAL_TABLET | Freq: Every day | ORAL | 2 refills | Status: DC

## 2020-07-15 MED ORDER — ATOMOXETINE HCL 40 MG PO CAPS: 40.0000 mg | ORAL_CAPSULE | Freq: Every day | ORAL | 2 refills | Status: DC

## 2020-07-15 MED ORDER — RISPERIDONE 1 MG PO TBDP: 1.0000 mg | ORAL_TABLET | Freq: Every day | ORAL | 2 refills | Status: DC

## 2020-07-15 NOTE — Progress Notes (Signed)
BH MD/PA/NP OP Progress Note  07/15/2020 10:38 AM Ana Best  MRN:  [id number]  Chief Complaint:  " I am mojo is gone.  I am starting to feel a little depressed."  HPI: 34 year old female seen today for follow up psychiatric evaluation.  She has a psychiatric history of ADD, depression, bipolar 2 disorder, panic disorder, and psychoactive substance induced psychosis.  She is currently being managed out of Risperdal 2 mg HS and Klonopin 0.5 mg twice daily. Today she reports that she has been experiencing symptoms of anxiety and depression.  Patient notes that she has been experiencing increased anxiety and depression.  She notes that she lacks motivations to get things done such as cleaning her house.  She also notes that her sleep has declined and she has had problems concentrating.  Patient reports that recently her son started kindergarten and she reports that she has been having difficult times helping him in school due to poor concentration.  She notes that she continues to forget task, is distractible, avoids activities that require mental effort, and is not organized.  She informed Clinical research associate that her house is messy because she is so disorganized and lacks energy to clean it. She denies SI/HI/VH.  Patient also notes that she has gained 20 pounds and is self-conscious about her weight.  She provided if Risperdal could be the cause of her weight gain.  Provider informed patient that weight gain is a side effect of the medication.  She reports that her current dose of Risperdal makes her feel bogged down.   Patient notes that she takes Klonopin infrequently. Patient  agreeable to potentially reducing Klonopin at follow-up visit.  He is also agreeable to start Lexapro 10 mg daily to help improve symptoms of anxiety and depression.  She will start Strattera 40 mg to help improve concentration. She will follow-up with outpatient counselor for therapy.  No other concerns noted at this time. Visit  Diagnosis:    ICD-10-CM   1. Attention deficit hyperactivity disorder (ADHD), predominantly inattentive type  F90.0 atomoxetine (STRATTERA) 40 MG capsule  2. Mild episode of recurrent major depressive disorder (HCC)  F33.0 escitalopram (LEXAPRO) 10 MG tablet    risperiDONE (RISPERDAL M-TABS) 1 MG disintegrating tablet  3. Generalized anxiety disorder  F41.1 clonazePAM (KLONOPIN) 0.5 MG tablet    Past Psychiatric History:  ADD, depression, bipolar 2 disorder, panic disorder, and psychoactive substance induced psychosis.  Past Medical History:  Past Medical History:  Diagnosis Date  . Anxiety   . Bipolar 1 disorder (HCC)   . Depression   . Hypertension     Past Surgical History:  Procedure Laterality Date  . DILATION AND CURETTAGE OF UTERUS    . TONSILLECTOMY    . WISDOM TOOTH EXTRACTION      Family Psychiatric History: Father alcohol use disorder, anxiety, and depression  Family History:  Family History  Problem Relation Age of Onset  . Depression Father   . Anxiety disorder Father   . ADD / ADHD Brother     Social History:  Social History   Socioeconomic History  . Marital status: Single    Spouse name: Not on file  . Number of children: Not on file  . Years of education: Not on file  . Highest education level: Not on file  Occupational History  . Not on file  Tobacco Use  . Smoking status: Current Every Day Smoker    Packs/day: 0.50    Years: 10.00    Pack  years: 5.00    Types: Cigarettes    Last attempt to quit: 03/08/2014    Years since quitting: 6.3  . Smokeless tobacco: Never Used  Vaping Use  . Vaping Use: Never used  Substance and Sexual Activity  . Alcohol use: Yes    Alcohol/week: 0.0 standard drinks    Comment: drinks- 3-4 beers  . Drug use: No  . Sexual activity: Yes    Partners: Male    Comment: number of sex partners in the last 12 months  1  Other Topics Concern  . Not on file  Social History Narrative  . Not on file   Social  Determinants of Health   Financial Resource Strain:   . Difficulty of Paying Living Expenses: Not on file  Food Insecurity:   . Worried About Programme researcher, broadcasting/film/video in the Last Year: Not on file  . Ran Out of Food in the Last Year: Not on file  Transportation Needs:   . Lack of Transportation (Medical): Not on file  . Lack of Transportation (Non-Medical): Not on file  Physical Activity:   . Days of Exercise per Week: Not on file  . Minutes of Exercise per Session: Not on file  Stress:   . Feeling of Stress : Not on file  Social Connections:   . Frequency of Communication with Friends and Family: Not on file  . Frequency of Social Gatherings with Friends and Family: Not on file  . Attends Religious Services: Not on file  . Active Member of Clubs or Organizations: Not on file  . Attends Banker Meetings: Not on file  . Marital Status: Not on file    Allergies: No Known Allergies  Metabolic Disorder Labs: Lab Results  Component Value Date   HGBA1C 5.1 05/28/2020   MPG 99.67 05/28/2020   No results found for: PROLACTIN Lab Results  Component Value Date   CHOL 158 05/28/2020   TRIG 118 05/28/2020   HDL 45 05/28/2020   CHOLHDL 3.5 05/28/2020   VLDL 24 05/28/2020   LDLCALC 89 05/28/2020     Therapeutic Level Labs: No results found for: LITHIUM No results found for: VALPROATE No components found for:  CBMZ  Current Medications: Current Outpatient Medications  Medication Sig Dispense Refill  . atomoxetine (STRATTERA) 40 MG capsule Take 1 capsule (40 mg total) by mouth daily. 30 capsule 2  . clonazePAM (KLONOPIN) 0.5 MG tablet Take 1 tablet (0.5 mg total) by mouth 2 (two) times daily as needed (anxiety). 30 tablet 0  . escitalopram (LEXAPRO) 10 MG tablet Take 1 tablet (10 mg total) by mouth daily. 30 tablet 2  . risperiDONE (RISPERDAL M-TABS) 1 MG disintegrating tablet Take 1 tablet (1 mg total) by mouth at bedtime. For mood control 30 tablet 2   No current  facility-administered medications for this visit.     Musculoskeletal: Strength & Muscle Tone: within normal limits Gait & Station: normal Patient leans: N/A  Psychiatric Specialty Exam: Review of Systems  There were no vitals taken for this visit.There is no height or weight on file to calculate BMI.  General Appearance: Well Groomed  Eye Contact:  Good  Speech:  Clear and Coherent and Normal Rate  Volume:  Normal  Mood:  Anxious and Depressed  Affect:  Congruent  Thought Process:  Coherent, Goal Directed and Linear  Orientation:  Full (Time, Place, and Person)  Thought Content: WDL and Logical   Suicidal Thoughts:  No  Homicidal Thoughts:  No  Memory:  Immediate;   Good Recent;   Good Remote;   Good  Judgement:  Good  Insight:  Good  Psychomotor Activity:  Normal  Concentration:  Concentration: Good and Attention Span: Good  Recall:  Good  Fund of Knowledge: Good  Language: Good  Akathisia:  No  Handed:  Right  AIMS (if indicated): Not done  Assets:  Communication Skills Desire for Improvement Financial Resources/Insurance Housing Social Support  ADL's:  Intact  Cognition: WNL  Sleep:  Fair   Screenings: AIMS     [facility]  AIMS Total Score 0    PHQ2-9     Office Visit from 11/06/2015 in Primary Care at [facility] Visit from 05/01/2015 in Primary Care at [facility] Total Score 0 0       Assessment and Plan: Patient endorses symptoms of anxiety, depression, and poor concentration.  She notes that risperidone makes her feel bogged down.  She is agreeable to starting Lexapro 10 mg daily to help manage symptoms of anxiety and depression.  She is also agreeable to reducing risperidone 2 mg to 1 mg at bedtime to manage mood.  She will start Strattera 40 mg daily to help improve concentration.  She will continue all other medications as prescribed.  1. Attention deficit hyperactivity  disorder (ADHD), predominantly inattentive type  Start- atomoxetine (STRATTERA) 40 MG capsule; Take 1 capsule (40 mg total) by mouth daily.  Dispense: 30 capsule; Refill: 2  2. Mild episode of recurrent major depressive disorder (HCC)  Start- escitalopram (LEXAPRO) 10 MG tablet; Take 1 tablet (10 mg total) by mouth daily.  Dispense: 30 tablet; Refill: 2 Decreased- risperiDONE (RISPERDAL M-TABS) 1 MG disintegrating tablet; Take 1 tablet (1 mg total) by mouth at bedtime. For mood control  Dispense: 30 tablet; Refill: 2  3. Generalized anxiety disorder  Continue- clonazePAM (KLONOPIN) 0.5 MG tablet; Take 1 tablet (0.5 mg total) by mouth 2 (two) times daily as needed (anxiety).  Dispense: 30 tablet; Refill: 0  Follow-up in 2 months Follow-up with therapy Shanna Cisco, NP 07/15/2020, 10:38 AM

## 2020-08-03 ENCOUNTER — Ambulatory Visit (INDEPENDENT_AMBULATORY_CARE_PROVIDER_SITE_OTHER): Payer: No Payment, Other | Admitting: Clinical

## 2020-08-03 ENCOUNTER — Other Ambulatory Visit: Payer: Self-pay

## 2020-08-03 ENCOUNTER — Ambulatory Visit (INDEPENDENT_AMBULATORY_CARE_PROVIDER_SITE_OTHER): Payer: No Payment, Other | Admitting: Psychiatry

## 2020-08-03 ENCOUNTER — Encounter (HOSPITAL_COMMUNITY): Payer: Self-pay | Admitting: Psychiatry

## 2020-08-03 DIAGNOSIS — F411 Generalized anxiety disorder: Secondary | ICD-10-CM | POA: Diagnosis not present

## 2020-08-03 DIAGNOSIS — F900 Attention-deficit hyperactivity disorder, predominantly inattentive type: Secondary | ICD-10-CM | POA: Diagnosis not present

## 2020-08-03 DIAGNOSIS — F330 Major depressive disorder, recurrent, mild: Secondary | ICD-10-CM | POA: Insufficient documentation

## 2020-08-03 DIAGNOSIS — F3341 Major depressive disorder, recurrent, in partial remission: Secondary | ICD-10-CM

## 2020-08-03 MED ORDER — CLONAZEPAM 0.5 MG PO TABS: 0.5000 mg | ORAL_TABLET | Freq: Two times a day (BID) | ORAL | 1 refills | Status: DC | PRN

## 2020-08-03 MED ORDER — RISPERIDONE 1 MG PO TBDP: 1.0000 mg | ORAL_TABLET | Freq: Every day | ORAL | 2 refills | Status: DC

## 2020-08-03 MED ORDER — BUPROPION HCL ER (XL) 150 MG PO TB24: 150.0000 mg | ORAL_TABLET | ORAL | 2 refills | Status: DC

## 2020-08-03 MED ORDER — ESCITALOPRAM OXALATE 20 MG PO TABS: 20.0000 mg | ORAL_TABLET | Freq: Every day | ORAL | 1 refills | Status: DC

## 2020-08-03 MED ORDER — AMPHETAMINE-DEXTROAMPHET ER 15 MG PO CP24: 15.0000 mg | ORAL_CAPSULE | Freq: Every day | ORAL | 0 refills | Status: DC

## 2020-08-03 NOTE — Progress Notes (Signed)
   THERAPIST PROGRESS NOTE  Session Time: 30 minutes  Participation Level: Active  Behavioral Response: CasualAlertDepressed  Type of Therapy: Individual Therapy  Treatment Goals addressed: Diagnosis: Depression  Interventions: CBT  Summary:  Marilu Rylander is a 34 y.o. female who presents during the walk in clinic for an individual appointment. Client presented oriented times five, appropriately dressed, and friendly. Client denied hallucinations and delusions.  Client reported on today "feeling down". Client discussed with the therapist the origin of her depression currently. Client reported when she was discharged from the hospital earlier this year she felt "on top of the world" but then reality set in. Client reported when she son started kindergarten she has had a hard time staying organized, cleaning the house, and her managing self care of brushing teeth and getting out of bed to get dressed. Client also discussed feeling bad because she is feeling "resentment" from her partner because she is not working and not able to keep maintain the house in the last few weeks.  Client reported in regard to medication she has been doing well on all her medication except the Strattera. Client reported she has gained 30 pounds. Client reported it made her feel nauseous and was unable to keep taking it. Client reported her brother was in town and she swapped a Theatre manager for one of his Adderall XR and she was able to get things done with no negative side effects.  Client reported currently she is feeling "disabled" by her depression and having low motivation to do things. Client reported her partner makes comments about "what have you done all day" and feeling bad because she can't manage to complete household tasks. Client reported she also wants to stop using vapes and reported she would like to talk to the doctor about Wellbutrin because in the past it helped with her concentration and helped her to  stop vaping. Client reported "I like to have a clean house and have dinner ready so I know this is not me". Client engaged with the therapist to discuss breaking tasks down to being more manageable, encouraging positive self talk opposed to negative, and encouraging the use of physical exercise which she used in her daily routine in the past to help alleviate fatigue.       Suicidal/Homicidal: Nowithout intent/plan  Therapist Response:  Therapist began the session by checking in and asking the client how she has been doing since the last session. Therapist allowed time to focus on the client thoughts and feelings while actively listening. Therapist collaborated with the client by asking open ended questions about medication compliance and mental health symptoms. Therapist used CBT to engage the client in discussion about her self talk in coordination in relation to her motivation to perform tasks. Therapist engaged the client in discussion and practicing positive self -talk. Therapist assigned the client homework to engage in one to two tasks daily along with practicing prior effective habit of physical exercise of walking daily to make tasks more manageable.     Plan: Return again in 5 weeks for individual therapy.  Diagnosis: Recurrent Major depressive disorder, in partial remission   Neena Rhymes Meeyah Ovitt, LCSW 08/03/2020

## 2020-08-03 NOTE — Progress Notes (Signed)
BH MD/PA/NP OP Progress Note  08/03/2020 2:04 PM Ana Best  MRN:  [id number]  Chief Complaint:  " The Strattera made me sick. My equilibrium  was off ."  HPI: 34 year old female seen today for follow up psychiatric evaluation.  She has a psychiatric history of ADD, depression, bipolar 2 disorder, panic disorder, and psychoactive substance induced psychosis.  She is currently being managed out of Risperdal 1 mg nightly,  Klonopin 0.5 mg twice daily, Lexapro 10 mg, and Strattera 40 mg daily. Today she reports that she has been experiencing symptoms of anxiety and depression. She also notes that Strattera made her feel sick.  Today she is well groomed, pleasant, cooperative, maintained eye contact, and engaged in conversation. She describes her mood as depressed and notes that she lacks motivations to get things done such as cleaning her house, exercising, or help her son with homework. She notes that she continues to forget task, is distractible, avoids activities that require mental effort, and is not organized. Patient notes that she discontinued Strattera because of side effects such as nausea, dizziness, and headaches. She notes that her brother recently visited and she notes that she tried one of his Adderall which was effective. She notes that she frequently becomes anxious because she is not able to complete task. She notes her mental health is interfering with her relationship and her activities of daily living. She denies SI/HI/VH.  Patient notes that she takes Klonopin infrequently. Patient  agreeable to potentially reducing Klonopin at follow-up visit.  She is also agreeable to increase Lexapro 10 mg daily to 20 mg  help improve symptoms of anxiety and depression.  She will also start Wellbutrin 150 mg XL to help mange depression and anxiety. She will start Adderall Xl 15 mg and discontinue Strattera 40 mg to help improve concentration. She will continue all other medications as prescribed  and follow-up with outpatient counselor for therapy.  No other concerns noted at this time. Visit Diagnosis:    ICD-10-CM   1. Attention deficit hyperactivity disorder (ADHD), predominantly inattentive type  F90.0 amphetamine-dextroamphetamine (ADDERALL XR) 15 MG 24 hr capsule  2. Generalized anxiety disorder  F41.1 clonazePAM (KLONOPIN) 0.5 MG tablet    buPROPion (WELLBUTRIN XL) 150 MG 24 hr tablet  3. Mild episode of recurrent major depressive disorder (HCC)  F33.0 escitalopram (LEXAPRO) 20 MG tablet    buPROPion (WELLBUTRIN XL) 150 MG 24 hr tablet    risperiDONE (RISPERDAL M-TABS) 1 MG disintegrating tablet    Past Psychiatric History:  ADD, depression, bipolar 2 disorder, panic disorder, and psychoactive substance induced psychosis.  Past Medical History:  Past Medical History:  Diagnosis Date  . Anxiety   . Bipolar 1 disorder (HCC)   . Depression   . Hypertension     Past Surgical History:  Procedure Laterality Date  . DILATION AND CURETTAGE OF UTERUS    . TONSILLECTOMY    . WISDOM TOOTH EXTRACTION      Family Psychiatric History: Father alcohol use disorder, anxiety, and depression  Family History:  Family History  Problem Relation Age of Onset  . Depression Father   . Anxiety disorder Father   . ADD / ADHD Brother     Social History:  Social History   Socioeconomic History  . Marital status: Single    Spouse name: Not on file  . Number of children: Not on file  . Years of education: Not on file  . Highest education level: Not on file  Occupational History  .  Not on file  Tobacco Use  . Smoking status: Current Every Day Smoker    Packs/day: 0.50    Years: 10.00    Pack years: 5.00    Types: Cigarettes    Last attempt to quit: 03/08/2014    Years since quitting: 6.4  . Smokeless tobacco: Never Used  Vaping Use  . Vaping Use: Never used  Substance and Sexual Activity  . Alcohol use: Yes    Alcohol/week: 0.0 standard drinks    Comment: drinks- 3-4  beers  . Drug use: No  . Sexual activity: Yes    Partners: Male    Comment: number of sex partners in the last 12 months  1  Other Topics Concern  . Not on file  Social History Narrative  . Not on file   Social Determinants of Health   Financial Resource Strain:   . Difficulty of Paying Living Expenses: Not on file  Food Insecurity:   . Worried About Programme researcher, broadcasting/film/video in the Last Year: Not on file  . Ran Out of Food in the Last Year: Not on file  Transportation Needs:   . Lack of Transportation (Medical): Not on file  . Lack of Transportation (Non-Medical): Not on file  Physical Activity:   . Days of Exercise per Week: Not on file  . Minutes of Exercise per Session: Not on file  Stress:   . Feeling of Stress : Not on file  Social Connections:   . Frequency of Communication with Friends and Family: Not on file  . Frequency of Social Gatherings with Friends and Family: Not on file  . Attends Religious Services: Not on file  . Active Member of Clubs or Organizations: Not on file  . Attends Banker Meetings: Not on file  . Marital Status: Not on file    Allergies: No Known Allergies  Metabolic Disorder Labs: Lab Results  Component Value Date   HGBA1C 5.1 05/28/2020   MPG 99.67 05/28/2020   No results found for: PROLACTIN Lab Results  Component Value Date   CHOL 158 05/28/2020   TRIG 118 05/28/2020   HDL 45 05/28/2020   CHOLHDL 3.5 05/28/2020   VLDL 24 05/28/2020   LDLCALC 89 05/28/2020     Therapeutic Level Labs: No results found for: LITHIUM No results found for: VALPROATE No components found for:  CBMZ  Current Medications: Current Outpatient Medications  Medication Sig Dispense Refill  . amphetamine-dextroamphetamine (ADDERALL XR) 15 MG 24 hr capsule Take 1 capsule by mouth daily. 30 capsule 0  . buPROPion (WELLBUTRIN XL) 150 MG 24 hr tablet Take 1 tablet (150 mg total) by mouth every morning. 30 tablet 2  . clonazePAM (KLONOPIN) 0.5 MG  tablet Take 1 tablet (0.5 mg total) by mouth 2 (two) times daily as needed (anxiety). 30 tablet 1  . escitalopram (LEXAPRO) 20 MG tablet Take 1 tablet (20 mg total) by mouth daily. 30 tablet 1  . risperiDONE (RISPERDAL M-TABS) 1 MG disintegrating tablet Take 1 tablet (1 mg total) by mouth at bedtime. For mood control 30 tablet 2   No current facility-administered medications for this visit.     Musculoskeletal: Strength & Muscle Tone: within normal limits Gait & Station: normal Patient leans: N/A  Psychiatric Specialty Exam: Review of Systems  There were no vitals taken for this visit.There is no height or weight on file to calculate BMI.  General Appearance: Well Groomed  Eye Contact:  Good  Speech:  Clear and  Coherent and Normal Rate  Volume:  Normal  Mood:  Anxious and Depressed  Affect:  Congruent  Thought Process:  Coherent, Goal Directed and Linear  Orientation:  Full (Time, Place, and Person)  Thought Content: WDL and Logical   Suicidal Thoughts:  No  Homicidal Thoughts:  No  Memory:  Immediate;   Good Recent;   Good Remote;   Good  Judgement:  Good  Insight:  Good  Psychomotor Activity:  Normal  Concentration:  Concentration: Good and Attention Span: Good  Recall:  Good  Fund of Knowledge: Good  Language: Good  Akathisia:  No  Handed:  Right  AIMS (if indicated): Not done  Assets:  Communication Skills Desire for Improvement Financial Resources/Insurance Housing Social Support  ADL's:  Intact  Cognition: WNL  Sleep:  Fair   Screenings: AIMS     [facility]  AIMS Total Score 0    PHQ2-9     Office Visit from 11/06/2015 in Primary Care at [facility] Visit from 05/01/2015 in Primary Care at [facility] Total Score 0 0       Assessment and Plan: Patient endorses symptoms of anxiety, depression, and poor concentration.   She is agreeable to increase Lexapro 10 mg daily to 20  mg  help improve symptoms of anxiety and depression.  She will also start Wellbutrin 150 mg XL to help mange depression and anxiety. She will start Adderall Xl 15 mg and discontinue Strattera 40 mg to help improve concentration. She will continue all other medications as prescribed and follow-up with outpatient counselor for therapy. 1. Generalized anxiety disorder  Continue- clonazePAM (KLONOPIN) 0.5 MG tablet; Take 1 tablet (0.5 mg total) by mouth 2 (two) times daily as needed (anxiety).  Dispense: 30 tablet; Refill: 1 Continue- buPROPion (WELLBUTRIN XL) 150 MG 24 hr tablet; Take 1 tablet (150 mg total) by mouth every morning.  Dispense: 30 tablet; Refill: 2  2. Mild episode of recurrent major depressive disorder (HCC)  Increased- escitalopram (LEXAPRO) 20 MG tablet; Take 1 tablet (20 mg total) by mouth daily.  Dispense: 30 tablet; Refill: 1 Start- buPROPion (WELLBUTRIN XL) 150 MG 24 hr tablet; Take 1 tablet (150 mg total) by mouth every morning.  Dispense: 30 tablet; Refill: 2 Continue- risperiDONE (RISPERDAL M-TABS) 1 MG disintegrating tablet; Take 1 tablet (1 mg total) by mouth at bedtime. For mood control  Dispense: 30 tablet; Refill: 2  3. Attention deficit hyperactivity disorder (ADHD), predominantly inattentive type  Start- amphetamine-dextroamphetamine (ADDERALL XR) 15 MG 24 hr capsule; Take 1 capsule by mouth daily.  Dispense: 30 capsule; Refill: 0  Follow-up in 2 months Follow-up with therapy  Shanna Cisco, NP 08/03/2020, 2:04 PM

## 2020-08-24 ENCOUNTER — Telehealth (INDEPENDENT_AMBULATORY_CARE_PROVIDER_SITE_OTHER): Payer: No Payment, Other | Admitting: Psychiatry

## 2020-08-24 ENCOUNTER — Telehealth (HOSPITAL_COMMUNITY): Payer: Self-pay | Admitting: *Deleted

## 2020-08-24 ENCOUNTER — Other Ambulatory Visit (INDEPENDENT_AMBULATORY_CARE_PROVIDER_SITE_OTHER): Payer: No Payment, Other | Admitting: Psychiatry

## 2020-08-24 DIAGNOSIS — F330 Major depressive disorder, recurrent, mild: Secondary | ICD-10-CM

## 2020-08-24 DIAGNOSIS — F411 Generalized anxiety disorder: Secondary | ICD-10-CM

## 2020-08-24 DIAGNOSIS — F900 Attention-deficit hyperactivity disorder, predominantly inattentive type: Secondary | ICD-10-CM | POA: Diagnosis not present

## 2020-08-24 MED ORDER — RISPERIDONE 2 MG PO TBDP: 2.0000 mg | ORAL_TABLET | Freq: Every day | ORAL | 2 refills | Status: DC

## 2020-08-24 NOTE — Telephone Encounter (Signed)
Patient informed Clinical research associate that her emotions are out of control. She notes that she has been irritable, mad, and has had racing thoughts lately. She notes that she recently broke up with her boyfriend.  She notes that she spent a week in her bed. She denies active guilty, grandiosity, impulsive behaviors. She is agreeable to increasing Risperdal 1 mg to 2 mg to help manage mood.

## 2020-08-24 NOTE — Telephone Encounter (Signed)
VM left for writer stating she is "spiraling" and needs to be put back on a mood stabilizer. She has an appt with the provider on 11/12. Will reach out to NP to see if she wants to have her walk in or can prescribe over the phone.

## 2020-08-31 ENCOUNTER — Telehealth (HOSPITAL_COMMUNITY): Payer: Self-pay | Admitting: *Deleted

## 2020-08-31 ENCOUNTER — Other Ambulatory Visit (HOSPITAL_COMMUNITY): Payer: Self-pay | Admitting: Psychiatry

## 2020-08-31 DIAGNOSIS — F900 Attention-deficit hyperactivity disorder, predominantly inattentive type: Secondary | ICD-10-CM

## 2020-08-31 MED ORDER — AMPHETAMINE-DEXTROAMPHET ER 15 MG PO CP24: 15.0000 mg | ORAL_CAPSULE | Freq: Every day | ORAL | 0 refills | Status: DC

## 2020-08-31 NOTE — Telephone Encounter (Signed)
VM from patient requesting a new RX on her Adderall. She should be out after reviewing the recored. She has a return appt on 10/02/20. Will bring this to the attention of Ms Doyne Keel NP.

## 2020-08-31 NOTE — Telephone Encounter (Signed)
Medications refilled and sent to preferred pharmacy.

## 2020-09-03 ENCOUNTER — Other Ambulatory Visit: Payer: Self-pay

## 2020-09-03 ENCOUNTER — Ambulatory Visit (INDEPENDENT_AMBULATORY_CARE_PROVIDER_SITE_OTHER): Payer: Self-pay | Admitting: Psychiatry

## 2020-09-03 ENCOUNTER — Encounter: Payer: Self-pay | Admitting: Psychiatry

## 2020-09-03 DIAGNOSIS — F4001 Agoraphobia with panic disorder: Secondary | ICD-10-CM

## 2020-09-03 DIAGNOSIS — F3181 Bipolar II disorder: Secondary | ICD-10-CM

## 2020-09-03 DIAGNOSIS — F411 Generalized anxiety disorder: Secondary | ICD-10-CM

## 2020-09-03 DIAGNOSIS — F15151 Other stimulant abuse with stimulant-induced psychotic disorder with hallucinations: Secondary | ICD-10-CM

## 2020-09-03 DIAGNOSIS — F5105 Insomnia due to other mental disorder: Secondary | ICD-10-CM

## 2020-09-03 DIAGNOSIS — F900 Attention-deficit hyperactivity disorder, predominantly inattentive type: Secondary | ICD-10-CM

## 2020-09-03 MED ORDER — MODAFINIL 200 MG PO TABS: ORAL_TABLET | ORAL | 1 refills | Status: DC
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
## 2020-09-03 NOTE — Patient Instructions (Addendum)
Reduce Lexapro to 1/2 tablet for 7 days, then 1/4 tablet daily for 7 days then stop it.  Call if you have dizziness.  Start Vraylar 3 mg daily for 4 days then increase to 4.5 mg daily.  Start modafinil 200 mg tablet 1/2 tablet in AM until the Adderall is gone and then increase to 1 tablet daily.

## 2020-09-15 ENCOUNTER — Encounter (HOSPITAL_COMMUNITY): Payer: No Payment, Other | Admitting: Psychiatry

## 2020-09-28 ENCOUNTER — Telehealth (HOSPITAL_COMMUNITY): Payer: Self-pay | Admitting: *Deleted

## 2020-09-28 ENCOUNTER — Other Ambulatory Visit (HOSPITAL_COMMUNITY): Payer: Self-pay | Admitting: Psychiatry

## 2020-09-28 DIAGNOSIS — F411 Generalized anxiety disorder: Secondary | ICD-10-CM

## 2020-09-28 DIAGNOSIS — F900 Attention-deficit hyperactivity disorder, predominantly inattentive type: Secondary | ICD-10-CM

## 2020-09-28 MED ORDER — AMPHETAMINE-DEXTROAMPHET ER 15 MG PO CP24: 15.0000 mg | ORAL_CAPSULE | Freq: Every day | ORAL | 0 refills | Status: DC

## 2020-09-28 NOTE — Telephone Encounter (Signed)
VM left for writer from patient seeking her Adderall and Klonpin. She has an available  Rx for Klonopin but will need a new Rx for her Adderall. Will bring concern to providers attention.

## 2020-09-28 NOTE — Telephone Encounter (Signed)
Medications refilled and sent to preferred pharmacy.

## 2020-10-01 ENCOUNTER — Ambulatory Visit (HOSPITAL_COMMUNITY): Payer: No Payment, Other | Admitting: Clinical

## 2020-10-02 ENCOUNTER — Encounter (HOSPITAL_COMMUNITY): Payer: No Payment, Other | Admitting: Psychiatry

## 2020-10-09 ENCOUNTER — Telehealth (HOSPITAL_COMMUNITY): Payer: No Payment, Other | Admitting: Psychiatry

## 2020-10-09 ENCOUNTER — Other Ambulatory Visit: Payer: Self-pay

## 2020-10-26 ENCOUNTER — Other Ambulatory Visit: Payer: Self-pay | Admitting: Psychiatry

## 2020-10-26 DIAGNOSIS — F900 Attention-deficit hyperactivity disorder, predominantly inattentive type: Secondary | ICD-10-CM

## 2020-10-26 NOTE — Telephone Encounter (Signed)
Please review message

## 2020-10-26 NOTE — Telephone Encounter (Signed)
Ana Best is in the process of getting a new dr. Where she is.  Has appt 11/18/20, but needs her modafinil refilled now.  Please send in the refill to get her to her appt. With the new dr.

## 2020-10-27 ENCOUNTER — Encounter (HOSPITAL_COMMUNITY): Payer: Self-pay | Admitting: Psychiatry

## 2020-10-27 ENCOUNTER — Telehealth (INDEPENDENT_AMBULATORY_CARE_PROVIDER_SITE_OTHER): Payer: Medicaid Other | Admitting: Psychiatry

## 2020-10-27 ENCOUNTER — Other Ambulatory Visit: Payer: Self-pay

## 2020-10-27 DIAGNOSIS — F900 Attention-deficit hyperactivity disorder, predominantly inattentive type: Secondary | ICD-10-CM

## 2020-10-27 DIAGNOSIS — F3181 Bipolar II disorder: Secondary | ICD-10-CM

## 2020-10-27 DIAGNOSIS — F411 Generalized anxiety disorder: Secondary | ICD-10-CM

## 2020-10-27 NOTE — Progress Notes (Signed)
BH MD/PA/NP OP Progress Note Virtual Visit via Video Note  I connected with Ana Best on 10/27/20 at  9:30 AM EST by a video enabled telemedicine application and verified that I am speaking with the correct person using two identifiers.  Location: Patient: Home Provider: Clinic   I discussed the limitations of evaluation and management by telemedicine and the availability of in person appointments. The patient expressed understanding and agreed to proceed.  I provided 30 minutes of non-face-to-face time during this encounter.    10/27/2020 10:47 AM Ana Best  MRN:  [id number]  Chief Complaint:  "Im so depressed."  HPI: 34 year old female seen today for follow up psychiatric evaluation.  She has a psychiatric history of ADD, depression, bipolar 2 disorder, panic disorder, and psychoactive substance induced psychosis.  She is currently being managed out of Risperdal 1 mg nightly,  Klonopin 0.5 mg twice daily, Lexapro 20 mg, and Adderall XL 15mg . Today she reports that she is depressed after stopping all of her medications accept Adderall.  Today patient is tearful, irritable, maintained eye contact, and somewhat engaged in conversation.  She noted that her mood is depressed because her life has turned upside down.  She informed provider that she is no longer with her significant other and her 47-year-old son is in the custody of her mother.  She endorsed depressive symptoms such as anhedonia, feelings of worthlessness, anxiety, hypersomnia (noting that she sleeps over 10 hours nightly), and psychomotor agitation.  Provider conducted a PHQ-9 and patient scored a 22.  She informed provider that she discontinued all of her medications except Adderall.  Today patient endorses distractibility, irritability, liable mood, and fluctuations in mood.  She noted that she saw Dr. 5 and he switched her Adderall to Modafinil and tapered her off of Lexapro.     Per Dr.Cottles note the patient  informed him that she "discontinued lamotrigine and Vraylar about January 2021.  She has lost weight  since being off the medication and says her energy level is better.  However she has recognized increased irritability and anger.  She  says she is easily provoked and it does not run in cycles otherwise". His Plan was as followed:  Wean lexapro bc destabilizing, reduce Lexapro to 1/2 tablet for 7 days, then 1/4 tablet daily for 7 days then stop it,start Vraylar  3 mg  daily for 4 days then increase to 4.5 mg daily, and start modafinil 200 mg tablet 1/2 tablet in AM until the Adderall is gone and then  increase to 1 tablet daily.  Patient informed provider that she discontinued Vraylar after taking it for a week.  Provider suggested that patient restart on a mood stabilizer however she notes that she did not want a mood stabilizer but only her Adderall.  She informed provider that she continues to take modafinil and request that provider restart her Adderall.  Provider informed patient that the 2 medication combination is not recommended.  She noted that she wanted to discontinue Monopril and restart Adderall and no other medications.  She also informed provider that she will be receiving ECT on December 29.  Provider recommended that patient be seen by writer or Dr. December 31. She noted that she disliked Dr. Jennelle Human and called him a derogatory terms.  She became more irritable and cursed at Jennelle Human informed her that Adderall would not be refilled at this time.  Patient then ended her virtual call with provider.  No medication adjustments made today.  No  other concerns noted at this time.  Visit Diagnosis:    ICD-10-CM   1. Generalized anxiety disorder  F41.1   2. Bipolar 2 disorder (HCC)  F31.81   3. Attention deficit hyperactivity disorder (ADHD), predominantly inattentive type  F90.0     Past Psychiatric History:  ADD, depression, bipolar 2 disorder, panic disorder, and psychoactive substance  induced psychosis.  Past Medical History:  Past Medical History:  Diagnosis Date  . Anxiety   . Bipolar 1 disorder (HCC)   . Depression   . Hypertension     Past Surgical History:  Procedure Laterality Date  . DILATION AND CURETTAGE OF UTERUS    . TONSILLECTOMY    . WISDOM TOOTH EXTRACTION      Family Psychiatric History: Father alcohol use disorder, anxiety, and depression  Family History:  Family History  Problem Relation Age of Onset  . Depression Father   . Anxiety disorder Father   . ADD / ADHD Brother     Social History:  Social History   Socioeconomic History  . Marital status: Single    Spouse name: Not on file  . Number of children: Not on file  . Years of education: Not on file  . Highest education level: Not on file  Occupational History  . Not on file  Tobacco Use  . Smoking status: Current Every Day Smoker    Packs/day: 0.50    Years: 10.00    Pack years: 5.00    Types: Cigarettes    Last attempt to quit: 03/08/2014    Years since quitting: 6.6  . Smokeless tobacco: Never Used  Vaping Use  . Vaping Use: Never used  Substance and Sexual Activity  . Alcohol use: Yes    Alcohol/week: 0.0 standard drinks    Comment: drinks- 3-4 beers  . Drug use: No  . Sexual activity: Yes    Partners: Male    Comment: number of sex partners in the last 12 months  1  Other Topics Concern  . Not on file  Social History Narrative  . Not on file   Social Determinants of Health   Financial Resource Strain:   . Difficulty of Paying Living Expenses: Not on file  Food Insecurity:   . Worried About Programme researcher, broadcasting/film/video in the Last Year: Not on file  . Ran Out of Food in the Last Year: Not on file  Transportation Needs:   . Lack of Transportation (Medical): Not on file  . Lack of Transportation (Non-Medical): Not on file  Physical Activity:   . Days of Exercise per Week: Not on file  . Minutes of Exercise per Session: Not on file  Stress:   . Feeling of Stress  : Not on file  Social Connections:   . Frequency of Communication with Friends and Family: Not on file  . Frequency of Social Gatherings with Friends and Family: Not on file  . Attends Religious Services: Not on file  . Active Member of Clubs or Organizations: Not on file  . Attends Banker Meetings: Not on file  . Marital Status: Not on file    Allergies: No Known Allergies  Metabolic Disorder Labs: Lab Results  Component Value Date   HGBA1C 5.1 05/28/2020   MPG 99.67 05/28/2020   No results found for: PROLACTIN Lab Results  Component Value Date   CHOL 158 05/28/2020   TRIG 118 05/28/2020   HDL 45 05/28/2020   CHOLHDL 3.5 05/28/2020   VLDL  24 05/28/2020   LDLCALC 89 05/28/2020     Therapeutic Level Labs: No results found for: LITHIUM No results found for: VALPROATE No components found for:  CBMZ  Current Medications: Current Outpatient Medications  Medication Sig Dispense Refill  . amphetamine-dextroamphetamine (ADDERALL XR) 15 MG 24 hr capsule Take 1 capsule by mouth daily. 30 capsule 0  . buPROPion (WELLBUTRIN XL) 150 MG 24 hr tablet Take 1 tablet (150 mg total) by mouth every morning. 30 tablet 2  . clonazePAM (KLONOPIN) 0.5 MG tablet TAKE 1 TABLET(0.5 MG) BY MOUTH TWICE DAILY AS NEEDED FOR ANXIETY 30 tablet 0  . escitalopram (LEXAPRO) 20 MG tablet Take 1 tablet (20 mg total) by mouth daily. 30 tablet 1  . modafinil (PROVIGIL) 200 MG tablet TAKE 1/2 TABLET BY MOUTH EVERY MORNING FOR ONE WEEK, THEN ONE TABLET BY MOUTH EVERY MORNING 30 tablet 1  . risperiDONE (RISPERDAL M-TABS) 2 MG disintegrating tablet Take 1 tablet (2 mg total) by mouth at bedtime. For mood control (Patient taking differently: Take 1 mg by mouth at bedtime. For mood control) 30 tablet 2   No current facility-administered medications for this visit.     Musculoskeletal: Strength & Muscle Tone: within normal limits Gait & Station: normal Patient leans: N/A  Psychiatric Specialty  Exam: Review of Systems  There were no vitals taken for this visit.There is no height or weight on file to calculate BMI.  General Appearance: Well Groomed  Eye Contact:  Good  Speech:  Clear and Coherent and Normal Rate  Volume:  Normal  Mood:  Anxious and Depressed  Affect:  Congruent  Thought Process:  Coherent, Goal Directed and Linear  Orientation:  Full (Time, Place, and Person)  Thought Content: WDL and Logical   Suicidal Thoughts:  No  Homicidal Thoughts:  No  Memory:  Immediate;   Good Recent;   Good Remote;   Good  Judgement:  Good  Insight:  Good  Psychomotor Activity:  Normal  Concentration:  Concentration: Good and Attention Span: Good  Recall:  Good  Fund of Knowledge: Good  Language: Good  Akathisia:  No  Handed:  Right  AIMS (if indicated): Not done  Assets:  Communication Skills Desire for Improvement Financial Resources/Insurance Housing Social Support  ADL's:  Intact  Cognition: WNL  Sleep:  Fair   Screenings: AIMS     [facility]  AIMS Total Score 0    GAD-7     Video Visit [facility] 10/27/2020 in [facility]  Total GAD-7 Score 13    PHQ2-9     Video Visit from 10/27/2020 in [facility] Visit from 11/06/2015 in Primary Care at [facility] Visit from 05/01/2015 in Primary Care at [facility] Total Score 6 0 0  PHQ-9 Total Score 22 -- --       Assessment and Plan: Patient endorses symptoms of anxiety, depression, and hypomania.  Patient noted that she does not want to restart any of her mood stabilizers or antidepressants.  She informed provider that she wants to discontinue modafinil and start Adderall.  Provider informed patient that Adderall would not be prescribed this time.  Patient became angry and disconnected call.  No medication changes made today.  1. Generalized anxiety disorder   2. Bipolar 2  disorder (HCC)   3. Attention deficit hyperactivity disorder (ADHD), predominantly inattentive type  Follow-upas needed Follow-up with therapy  Mihail Prettyman E  Doyne KeelParsons, NP 10/27/2020, 10:47 AM

## 2021-08-18 NOTE — Progress Notes (Signed)
Erroneous encounter.  Chart opened in error. 

## 2021-09-04 ENCOUNTER — Other Ambulatory Visit: Payer: Self-pay

## 2021-09-04 ENCOUNTER — Emergency Department (HOSPITAL_COMMUNITY)
Admission: EM | Admit: 2021-09-04 | Discharge: 2021-09-06 | Disposition: A | Discharge status: Other Acute Inpatient Hospital | Payer: Medicaid Other | Attending: Emergency Medicine | Admitting: Emergency Medicine

## 2021-09-04 DIAGNOSIS — I10 Essential (primary) hypertension: Secondary | ICD-10-CM | POA: Diagnosis not present

## 2021-09-04 DIAGNOSIS — R259 Unspecified abnormal involuntary movements: Secondary | ICD-10-CM | POA: Diagnosis not present

## 2021-09-04 DIAGNOSIS — R443 Hallucinations, unspecified: Secondary | ICD-10-CM

## 2021-09-04 DIAGNOSIS — F17210 Nicotine dependence, cigarettes, uncomplicated: Secondary | ICD-10-CM | POA: Diagnosis not present

## 2021-09-04 DIAGNOSIS — R440 Auditory hallucinations: Secondary | ICD-10-CM | POA: Insufficient documentation

## 2021-09-04 DIAGNOSIS — Z20822 Contact with and (suspected) exposure to covid-19: Secondary | ICD-10-CM | POA: Diagnosis not present

## 2021-09-04 DIAGNOSIS — Y900 Blood alcohol level of less than 20 mg/100 ml: Secondary | ICD-10-CM | POA: Diagnosis not present

## 2021-09-04 DIAGNOSIS — N9489 Other specified conditions associated with female genital organs and menstrual cycle: Secondary | ICD-10-CM | POA: Diagnosis not present

## 2021-09-04 DIAGNOSIS — F330 Major depressive disorder, recurrent, mild: Secondary | ICD-10-CM | POA: Diagnosis not present

## 2021-09-04 DIAGNOSIS — Z79899 Other long term (current) drug therapy: Secondary | ICD-10-CM | POA: Diagnosis not present

## 2021-09-04 DIAGNOSIS — F909 Attention-deficit hyperactivity disorder, unspecified type: Secondary | ICD-10-CM | POA: Diagnosis not present

## 2021-09-04 DIAGNOSIS — Z046 Encounter for general psychiatric examination, requested by authority: Secondary | ICD-10-CM | POA: Diagnosis present

## 2021-09-04 LAB — RAPID URINE DRUG SCREEN, HOSP PERFORMED
Amphetamines: POSITIVE — AB
Barbiturates: NOT DETECTED
Benzodiazepines: NOT DETECTED
Cocaine: NOT DETECTED
Opiates: NOT DETECTED
Tetrahydrocannabinol: POSITIVE — AB

## 2021-09-04 LAB — CBC WITH DIFFERENTIAL/PLATELET
Abs Immature Granulocytes: 0.02 10*3/uL (ref 0.00–0.07)
Basophils Absolute: 0.1 10*3/uL (ref 0.0–0.1)
Basophils Relative: 1 %
Eosinophils Absolute: 0.2 10*3/uL (ref 0.0–0.5)
Eosinophils Relative: 2 %
HCT: 41.8 % (ref 36.0–46.0)
Hemoglobin: 13.7 g/dL (ref 12.0–15.0)
Immature Granulocytes: 0 %
Lymphocytes Relative: 35 %
Lymphs Abs: 2.9 10*3/uL (ref 0.7–4.0)
MCH: 29.3 pg (ref 26.0–34.0)
MCHC: 32.8 g/dL (ref 30.0–36.0)
MCV: 89.5 fL (ref 80.0–100.0)
Monocytes Absolute: 0.8 10*3/uL (ref 0.1–1.0)
Monocytes Relative: 10 %
Neutro Abs: 4.2 10*3/uL (ref 1.7–7.7)
Neutrophils Relative %: 52 %
Platelets: 325 10*3/uL (ref 150–400)
RBC: 4.67 MIL/uL (ref 3.87–5.11)
RDW: 12.1 % (ref 11.5–15.5)
WBC: 8.1 10*3/uL (ref 4.0–10.5)
nRBC: 0 % (ref 0.0–0.2)

## 2021-09-04 LAB — COMPREHENSIVE METABOLIC PANEL
ALT: 37 U/L (ref 0–44)
AST: 30 U/L (ref 15–41)
Albumin: 4.7 g/dL (ref 3.5–5.0)
Alkaline Phosphatase: 47 U/L (ref 38–126)
Anion gap: 9 (ref 5–15)
BUN: 11 mg/dL (ref 6–20)
CO2: 23 mmol/L (ref 22–32)
Calcium: 9.4 mg/dL (ref 8.9–10.3)
Chloride: 106 mmol/L (ref 98–111)
Creatinine, Ser: 0.71 mg/dL (ref 0.44–1.00)
GFR, Estimated: >60 mL/min (ref >60)
Glucose, Bld: 92 mg/dL (ref 70–99)
Potassium: 4 mmol/L (ref 3.5–5.1)
Sodium: 138 mmol/L (ref 135–145)
Total Bilirubin: 1.1 mg/dL (ref 0.3–1.2)
Total Protein: 7.8 g/dL (ref 6.5–8.1)

## 2021-09-04 LAB — RESP PANEL BY RT-PCR (FLU A&B, COVID) ARPGX2
Influenza A by PCR: NEGATIVE
Influenza B by PCR: NEGATIVE
SARS Coronavirus 2 by RT PCR: NEGATIVE

## 2021-09-04 LAB — SALICYLATE LEVEL: Salicylate Lvl: <7 mg/dL — ABNORMAL LOW (ref 7.0–30.0)

## 2021-09-04 LAB — ACETAMINOPHEN LEVEL: Acetaminophen (Tylenol), Serum: <10 ug/mL — ABNORMAL LOW (ref 10–30)

## 2021-09-04 LAB — I-STAT BETA HCG BLOOD, ED (MC, WL, AP ONLY): I-stat hCG, quantitative: <5 m[IU]/mL (ref <5)

## 2021-09-04 LAB — ETHANOL: Alcohol, Ethyl (B): <10 mg/dL (ref <10)

## 2021-09-04 NOTE — ED Provider Notes (Signed)
[facility]-EMERGENCY DEPT Provider Note   CSN: [id number] Arrival date & time: 09/04/21  2126    History IVC   Ana Best is a 35 y.o. female past medical history significant for ADHD, bipolar, depression, polysubstance use who presents for evaluation under IVC.  According to IVC patient has stopped taking her bipolar and depression medications.  Apparently is showing hostile behavior to family, hallucinating.  Apparently threatened to kill her mother however patient denies this.  Mother believes patient is having psychotic behavior and needs commitment for medical management.  According to IVC patient has attempted at home medical management?  Patient denies any complaints.  She does admit to only using her ADHD medication not her depression or bipolar medication.  She states that someone has been following her.  She thinks she knows who that person is however will no expound on this.  She states that someone is "tapping" into her Alexa voice device at home and the voices are talking to her.  These are not command voices.  She also states someone is intermittently turning her lights off and on as well as her TVs.  Feels like occasionally the door handles will shake as if someone is trying to break into her house however when she looks no one is there. States she has not been sleeping well due to this.  She is very tearful in the room.  She denies any SI, HI and states "I know the voices are not in my head and they are real."  She denies any pain, fever cough emesis chest pain, abdominal pain, chance of pregnancy.  She has been tolerating p.o. intake at home without difficulty.  Denies any alcohol use, states she occasionally uses marijuana.  Denies additional aggravating or alleviating factors.  History obtained from patient and past medical records.  No interpreter used  ICV taken out by patients Mother  HPI     Past Medical History:  Diagnosis Date    Anxiety    Bipolar 1 disorder (HCC)    Depression    Hypertension     Patient Active Problem List   Diagnosis Date Noted   Mild episode of recurrent major depressive disorder (HCC) 08/03/2020   Generalized anxiety disorder 07/15/2020   Recurrent major depressive disorder, in partial remission (HCC) 06/09/2020   Amphetamine dependence (HCC)    Psychoactive substance-induced psychosis (HCC)    MDD (major depressive disorder) 05/25/2020   Bipolar 2 disorder (HCC) 05/25/2020   Panic 12/10/2018   Normal delivery 10/09/2014   Difficulty controlling anger 06/01/2014   Nicotine addiction 01/02/2013   Attention deficit hyperactivity disorder (ADHD), 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   Mood disorder (HCC) 07/11/2012   Poor self esteem 07/11/2012    Past Surgical History:  Procedure Laterality Date   DILATION AND CURETTAGE OF UTERUS     TONSILLECTOMY     WISDOM TOOTH EXTRACTION       OB History     Gravida  3   Para  1   Term  1   Preterm      AB  2   Living  1      SAB      IAB  2   Ectopic      Multiple  0   Live Births  1           Family History  Problem Relation Age of Onset   Depression Father    Anxiety disorder Father    ADD /  ADHD Brother     Social History   Tobacco Use   Smoking status: Every Day    Packs/day: 0.50    Years: 10.00    Pack years: 5.00    Types: Cigarettes    Last attempt to quit: 03/08/2014    Years since quitting: 7.4   Smokeless tobacco: Never  Vaping Use   Vaping Use: Never used  Substance Use Topics   Alcohol use: Yes    Alcohol/week: 0.0 standard drinks    Comment: drinks- 3-4 beers   Drug use: No    Home Medications Prior to Admission medications   Medication Sig Start Date End Date Taking? Authorizing Provider  amphetamine-dextroamphetamine (ADDERALL) 20 MG tablet Take 20 mg by mouth 3 (three) times daily. 08/22/21  Yes [provider]  amphetamine-dextroamphetamine (ADDERALL XR) 15 MG  24 hr capsule Take 1 capsule by mouth daily. Patient not taking: Reported on 09/04/2021 09/28/20   Shanna Cisco, NP  buPROPion (WELLBUTRIN XL) 150 MG 24 hr tablet Take 1 tablet (150 mg total) by mouth every morning. Patient not taking: Reported on 09/04/2021 08/03/20   Shanna Cisco, NP  clonazePAM (KLONOPIN) 0.5 MG tablet TAKE 1 TABLET(0.5 MG) BY MOUTH TWICE DAILY AS NEEDED FOR ANXIETY Patient not taking: Reported on 09/04/2021 09/28/20   Shanna Cisco, NP  escitalopram (LEXAPRO) 20 MG tablet Take 1 tablet (20 mg total) by mouth daily. Patient not taking: Reported on 09/04/2021 08/03/20   Toy Cookey E, NP  modafinil (PROVIGIL) 200 MG tablet TAKE 1/2 TABLET BY MOUTH EVERY MORNING FOR ONE WEEK, THEN ONE TABLET BY MOUTH EVERY MORNING Patient not taking: Reported on 09/04/2021 10/27/20   Cottle, Steva Ready., MD  risperiDONE (RISPERDAL M-TABS) 2 MG disintegrating tablet Take 1 tablet (2 mg total) by mouth at bedtime. For mood control Patient not taking: Reported on 09/04/2021 08/24/20   Shanna Cisco, NP    Allergies    Patient has no known allergies.  Review of Systems   Review of Systems  Constitutional: Negative.   HENT: Negative.    Respiratory: Negative.    Cardiovascular: Negative.   Genitourinary: Negative.   Musculoskeletal: Negative.   Skin: Negative.   Neurological: Negative.   Psychiatric/Behavioral:  Positive for sleep disturbance. The patient is nervous/anxious.   All other systems reviewed and are negative.  Physical Exam Updated Vital Signs BP 121/89   Pulse 80   Temp 98.3 F (36.8 C) (Oral)   Resp 16   Ht 5\' 7"  (1.702 m)   Wt 81.6 kg   SpO2 97%   BMI 28.19 kg/m   Physical Exam Vitals and nursing note reviewed.  Constitutional:      General: She is not in acute distress.    Appearance: She is well-developed. She is not ill-appearing, toxic-appearing or diaphoretic.  HENT:     Head: Normocephalic and atraumatic.     Nose: Nose  normal.  Eyes:     Pupils: Pupils are equal, round, and reactive to light.  Cardiovascular:     Rate and Rhythm: Normal rate.  Pulmonary:     Effort: No respiratory distress.  Abdominal:     General: There is no distension.  Musculoskeletal:        General: Normal range of motion.     Cervical back: Normal range of motion.  Skin:    General: Skin is warm and dry.     Capillary Refill: Capillary refill takes less than 2 seconds.  Neurological:  General: No focal deficit present.     Mental Status: She is alert and oriented to person, place, and time.  Psychiatric:        Attention and Perception: She perceives auditory hallucinations.        Mood and Affect: Mood normal. Affect is tearful.        Thought Content: Thought content is paranoid.     Comments: Tearful, admits to voices coming over her Alexa talking to her however denies command hallucinations.  Feels like someone is stalking her and that people have been turning on her lights and her TVs in her house. Occasionally feels like the tv voices are talking directly to her  She denies any SI, HI, AVH    ED Results / Procedures / Treatments   Labs (all labs ordered are listed, but only abnormal results are displayed) Labs Reviewed  SALICYLATE LEVEL - Abnormal; Notable for the following components:      Result Value   Salicylate Lvl <7.0 (*)    All other components within normal limits  ACETAMINOPHEN LEVEL - Abnormal; Notable for the following components:   Acetaminophen (Tylenol), Serum <10 (*)    All other components within normal limits  RESP PANEL BY RT-PCR (FLU A&B, COVID) [id number]  COMPREHENSIVE METABOLIC PANEL  ETHANOL  CBC WITH DIFFERENTIAL/PLATELET  RAPID URINE DRUG SCREEN, HOSP PERFORMED  I-STAT BETA HCG BLOOD, ED ([facility], [facility], [facility] ONLY)    EKG None  Radiology No results found.  Procedures Procedures   Medications Ordered in ED Medications - No data to display  ED Course  I have reviewed the triage  vital signs and the nursing notes.  Pertinent labs & imaging results that were available during my care of the patient were reviewed by me and considered in my medical decision making (see chart for details).  Here for evaluation under IVC taken out by Mother.  Admits to someone following her, people talking to her through her Alexa at home as well as people turning on her TVs and random lights in her house.  States she has not been sleeping.  She is not taking her bipolar depression meds at home.  According to IVC has threatened to harm family members and has been actively hallucinating with family.  She is very tearful in the room.  She denies any complaints.  Plan on screening labs, TTS consult  Labs without significant findings  Patient medically cleared.  Dispo per TTS  Hold orders placed    MDM Rules/Calculators/A&P                            Final Clinical Impression(s) / ED Diagnoses Final diagnoses:  Involuntary commitment  Hallucinations    Rx / DC Orders ED Discharge Orders     None        Giulian Goldring A, PA-C 09/04/21 2319    Gloris Manchester, MD 09/06/21 [phone]

## 2021-09-04 NOTE — ED Triage Notes (Signed)
Pt presents via [employer] with IVC paperwork stating pt has hx of bipolar and is not taking her medications and thus has become violently hallucinating and threatening toward family.

## 2021-09-05 MED ORDER — LORAZEPAM 1 MG PO TABS
1.0000 mg | ORAL_TABLET | ORAL | Status: AC | PRN
  Administered 2021-09-05: 1 mg via ORAL
  Filled 2021-09-05: qty 1

## 2021-09-05 MED ORDER — STERILE WATER FOR INJECTION IJ SOLN
INTRAMUSCULAR | Status: AC
  Administered 2021-09-05: 10 mL
  Filled 2021-09-05: qty 10

## 2021-09-05 MED ORDER — OLANZAPINE 10 MG PO TBDP
10.0000 mg | ORAL_TABLET | Freq: Every day | ORAL | Status: DC
  Administered 2021-09-05: 10 mg via ORAL
  Filled 2021-09-05 (×2): qty 1

## 2021-09-05 MED ORDER — ZIPRASIDONE MESYLATE 20 MG IM SOLR
20.0000 mg | INTRAMUSCULAR | Status: AC | PRN
  Administered 2021-09-05: 20 mg via INTRAMUSCULAR
  Filled 2021-09-05: qty 20

## 2021-09-05 MED ORDER — OLANZAPINE 10 MG PO TBDP: 10.0000 mg | ORAL_TABLET | Freq: Three times a day (TID) | ORAL | Status: DC | PRN

## 2021-09-05 NOTE — BH Assessment (Signed)
Clinician to engage pt in TTS assessment once IVC paperwork is received.   IVC paperwork has been faxed [phone]).    Redmond Pulling, MS, [facility], [facility] Triage Specialist [phone]

## 2021-09-05 NOTE — Progress Notes (Signed)
Per Ene Aijbola,NP, patient meets criteria for inpatient treatment. There are no available or appropriate beds at [facility] today. CSW faxed referrals to the following facilities for review:  [facility]  Pending - No Request Sent N/A [address] [place] [address] [phone] --  [facility]  Pending - No Request Sent N/A [address], [place] [address] [phone] --  [facility]  Pending - No Request Sent N/A [address] [place] [address] [phone] --  [facility]  Pending - No Request Sent N/A [address] [place] [address] [phone] --  [facility]  Pending - No Request Sent N/A [address] [place] [address] [phone] --  [facility]  Pending - No Request Sent N/A [address] [place], [place] [address] [phone] --  [facility]  Pending - No Request Sent N/A [address] [place] [address] [phone] --  [facility]  Pending - No Request Sent N/A [address], [place] [address] [phone] --  [facility]  Pending - No Request Sent N/A [address] [place] [address] [phone] --  [facility]  Pending - No Request Sent N/A [address] [place] [address] [phone] --  [facility]  Pending - No Request Sent N/A [address] [place] [address] [phone] --  [facility]  Pending - No Request Sent N/A [address] [place] [address] [phone] --  [facility]  Pending - No Request Sent N/A [address], [place] [address] [phone] --  [facility]  Pending - No Request Sent N/A [address], [place] [address] [phone] --  [facility]  Pending - No Request Sent N/A [address] [place] [address] [phone] --    TTS will continue to seek bed placement.  Crissie Reese, MSW, LCSW-A, LCAS-A Phone: [phone] Disposition/TOC
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
## 2021-09-05 NOTE — ED Notes (Signed)
Initially, pt refused to take Zyprexa. I encouraged her to take the rx to avoid IM medication. She took the rx, spit it out, stated she was leaving, and walked two laps around the ED. She began running when she saw security and [employer]. They returned pt to her room. Pt requested something for anxiety and took Ativan and Zyprexa without incident. Additionally, she is requesting "something to put me to sleep." I let her know we will wait to see if the rx she took will assist her to sleep prior to administering additional medication. She is crying, yelling, and screaming, "I hate it here. I'm mad. This is so fucked up. Get me out of here."
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
## 2021-09-05 NOTE — ED Notes (Addendum)
[facility] Ana Best stated that they will take pt tomorrow after 9 am

## 2021-09-05 NOTE — ED Notes (Signed)
Belongings in locker 28 

## 2021-09-05 NOTE — ED Notes (Signed)
Pt in bed resting, respirations even and unlabored. Due to previous behaviors vitals will be obtained when pt wakes.

## 2021-09-05 NOTE — Progress Notes (Addendum)
Per [name], pt has been accepted to [facility] unit. Accepting provider is Dr. Lamonte Sakai. Patient can arrive tomorrow after 9:00am. Number for report is [phone]: [phone].   Ana Best, MSW, LCSW-A Phone: [phone] Disposition/TOC

## 2021-09-05 NOTE — ED Notes (Signed)
Pt is becoming increasingly agitated and argumentative towards staff.

## 2021-09-05 NOTE — ED Notes (Signed)
Georgia Duff Mom [phone]. Mother requested that she needs to be informed before if patient will be discharge this AM.
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
## 2021-09-05 NOTE — ED Notes (Signed)
Pt refusing meals. Pt is resting at this time. Respirations even and unlabored. Due to pt previous behaviors vitals will be obtained when pt wakes.

## 2021-09-06 NOTE — ED Notes (Signed)
Writer gave report to Alessandra Bevels, RN at [facility].  Sheriff called for transport.

## 2021-10-05 ENCOUNTER — Emergency Department (HOSPITAL_COMMUNITY)
Admission: EM | Admit: 2021-10-05 | Discharge: 2021-10-08 | Disposition: A | Discharge status: Other Acute Inpatient Hospital | Payer: Medicaid Other | Attending: Emergency Medicine | Admitting: Emergency Medicine

## 2021-10-05 DIAGNOSIS — F17210 Nicotine dependence, cigarettes, uncomplicated: Secondary | ICD-10-CM | POA: Insufficient documentation

## 2021-10-05 DIAGNOSIS — F319 Bipolar disorder, unspecified: Secondary | ICD-10-CM | POA: Insufficient documentation

## 2021-10-05 DIAGNOSIS — Y900 Blood alcohol level of less than 20 mg/100 ml: Secondary | ICD-10-CM | POA: Diagnosis not present

## 2021-10-05 DIAGNOSIS — Z20822 Contact with and (suspected) exposure to covid-19: Secondary | ICD-10-CM | POA: Insufficient documentation

## 2021-10-05 DIAGNOSIS — I10 Essential (primary) hypertension: Secondary | ICD-10-CM | POA: Diagnosis not present

## 2021-10-05 DIAGNOSIS — F19959 Other psychoactive substance use, unspecified with psychoactive substance-induced psychotic disorder, unspecified: Secondary | ICD-10-CM | POA: Insufficient documentation

## 2021-10-05 DIAGNOSIS — N9489 Other specified conditions associated with female genital organs and menstrual cycle: Secondary | ICD-10-CM | POA: Diagnosis not present

## 2021-10-05 DIAGNOSIS — R001 Bradycardia, unspecified: Secondary | ICD-10-CM | POA: Diagnosis not present

## 2021-10-05 DIAGNOSIS — Z79899 Other long term (current) drug therapy: Secondary | ICD-10-CM | POA: Diagnosis not present

## 2021-10-05 DIAGNOSIS — F3181 Bipolar II disorder: Secondary | ICD-10-CM | POA: Diagnosis present

## 2021-10-05 DIAGNOSIS — Z046 Encounter for general psychiatric examination, requested by authority: Secondary | ICD-10-CM | POA: Diagnosis present

## 2021-10-05 LAB — I-STAT BETA HCG BLOOD, ED (MC, WL, AP ONLY): I-stat hCG, quantitative: <5 m[IU]/mL (ref <5)

## 2021-10-05 LAB — COMPREHENSIVE METABOLIC PANEL
ALT: 35 U/L (ref 0–44)
AST: 45 U/L — ABNORMAL HIGH (ref 15–41)
Albumin: 4.2 g/dL (ref 3.5–5.0)
Alkaline Phosphatase: 47 U/L (ref 38–126)
Anion gap: 9 (ref 5–15)
BUN: 9 mg/dL (ref 6–20)
CO2: 23 mmol/L (ref 22–32)
Calcium: 9 mg/dL (ref 8.9–10.3)
Chloride: 107 mmol/L (ref 98–111)
Creatinine, Ser: 0.75 mg/dL (ref 0.44–1.00)
GFR, Estimated: >60 mL/min (ref >60)
Glucose, Bld: 84 mg/dL (ref 70–99)
Potassium: 2.9 mmol/L — ABNORMAL LOW (ref 3.5–5.1)
Sodium: 139 mmol/L (ref 135–145)
Total Bilirubin: 0.8 mg/dL (ref 0.3–1.2)
Total Protein: 7.1 g/dL (ref 6.5–8.1)

## 2021-10-05 LAB — CBC WITH DIFFERENTIAL/PLATELET
Abs Immature Granulocytes: 0.07 10*3/uL (ref 0.00–0.07)
Basophils Absolute: 0.1 10*3/uL (ref 0.0–0.1)
Basophils Relative: 1 %
Eosinophils Absolute: 0.1 10*3/uL (ref 0.0–0.5)
Eosinophils Relative: 1 %
HCT: 37.4 % (ref 36.0–46.0)
Hemoglobin: 12.2 g/dL (ref 12.0–15.0)
Immature Granulocytes: 1 %
Lymphocytes Relative: 7 %
Lymphs Abs: 1.1 10*3/uL (ref 0.7–4.0)
MCH: 29.6 pg (ref 26.0–34.0)
MCHC: 32.6 g/dL (ref 30.0–36.0)
MCV: 90.8 fL (ref 80.0–100.0)
Monocytes Absolute: 1.2 10*3/uL — ABNORMAL HIGH (ref 0.1–1.0)
Monocytes Relative: 8 %
Neutro Abs: 12.2 10*3/uL — ABNORMAL HIGH (ref 1.7–7.7)
Neutrophils Relative %: 82 %
Platelets: 281 10*3/uL (ref 150–400)
RBC: 4.12 MIL/uL (ref 3.87–5.11)
RDW: 12.5 % (ref 11.5–15.5)
WBC: 14.7 10*3/uL — ABNORMAL HIGH (ref 4.0–10.5)
nRBC: 0 % (ref 0.0–0.2)

## 2021-10-05 LAB — RESP PANEL BY RT-PCR (FLU A&B, COVID) ARPGX2
Influenza A by PCR: NEGATIVE
Influenza B by PCR: NEGATIVE
SARS Coronavirus 2 by RT PCR: NEGATIVE

## 2021-10-05 LAB — RAPID URINE DRUG SCREEN, HOSP PERFORMED
Amphetamines: POSITIVE — AB
Barbiturates: NOT DETECTED
Benzodiazepines: NOT DETECTED
Cocaine: NOT DETECTED
Opiates: NOT DETECTED
Tetrahydrocannabinol: POSITIVE — AB

## 2021-10-05 LAB — ETHANOL: Alcohol, Ethyl (B): <10 mg/dL (ref <10)

## 2021-10-05 MED ORDER — ONDANSETRON HCL 4 MG PO TABS: 4.0000 mg | ORAL_TABLET | Freq: Three times a day (TID) | ORAL | Status: DC | PRN

## 2021-10-05 MED ORDER — ESCITALOPRAM OXALATE 10 MG PO TABS
20.0000 mg | ORAL_TABLET | Freq: Every day | ORAL | Status: DC
  Administered 2021-10-05 – 2021-10-07 (×3): 20 mg via ORAL
  Filled 2021-10-05 (×5): qty 2

## 2021-10-05 MED ORDER — RISPERIDONE 0.5 MG PO TBDP: 2.0000 mg | ORAL_TABLET | Freq: Every day | ORAL | Status: DC

## 2021-10-05 MED ORDER — ZOLPIDEM TARTRATE 5 MG PO TABS: 5.0000 mg | ORAL_TABLET | Freq: Every evening | ORAL | Status: DC | PRN

## 2021-10-05 MED ORDER — ONDANSETRON 4 MG PO TBDP
4.0000 mg | ORAL_TABLET | Freq: Once | ORAL | Status: AC
  Administered 2021-10-05: 4 mg via ORAL
  Filled 2021-10-05: qty 1

## 2021-10-05 MED ORDER — AMPHETAMINE-DEXTROAMPHET ER 5 MG PO CP24
15.0000 mg | ORAL_CAPSULE | Freq: Every day | ORAL | Status: DC
  Filled 2021-10-05 (×4): qty 3

## 2021-10-05 MED ORDER — BUPROPION HCL ER (XL) 150 MG PO TB24
150.0000 mg | ORAL_TABLET | ORAL | Status: DC
  Administered 2021-10-06 – 2021-10-08 (×3): 150 mg via ORAL
  Filled 2021-10-05 (×3): qty 1

## 2021-10-05 MED ORDER — CLONAZEPAM 0.5 MG PO TABS: 0.5000 mg | ORAL_TABLET | Freq: Two times a day (BID) | ORAL | Status: DC | PRN

## 2021-10-05 MED ORDER — MODAFINIL 200 MG PO TABS
100.0000 mg | ORAL_TABLET | Freq: Every day | ORAL | Status: DC
  Administered 2021-10-05 – 2021-10-07 (×3): 100 mg via ORAL
  Filled 2021-10-05 (×4): qty 1

## 2021-10-05 MED ORDER — RISPERIDONE 0.5 MG PO TBDP: 2.0000 mg | ORAL_TABLET | Freq: Three times a day (TID) | ORAL | Status: DC | PRN

## 2021-10-05 MED ORDER — LORAZEPAM 1 MG PO TABS: 1.0000 mg | ORAL_TABLET | ORAL | Status: DC | PRN

## 2021-10-05 MED ORDER — ZIPRASIDONE MESYLATE 20 MG IM SOLR: 20.0000 mg | INTRAMUSCULAR | Status: DC | PRN

## 2021-10-05 MED ORDER — ACETAMINOPHEN 325 MG PO TABS: 650.0000 mg | ORAL_TABLET | ORAL | Status: DC | PRN
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
## 2021-10-05 NOTE — Consult Note (Signed)
Patient has been resting and asleep, most of her duration since arriving to the emergency department.  It does appear that she received Haldol IM injection by EMS prior to arrival due to her psychosis and paranoia.  Writer attempted to contact the IVC petitioner(mother), numerous times however was unsuccessful.  Call with answer the phone and then hang up, she made no attempts to call back.  We will have TTS assess patient once she is awake and able to participate in comprehensive clinical assessment.  Chart review shows patient was recently admitted to old [place] 1 month ago September 02, 2021, for similar presentation.  Patient does have a history of aggression, volatile behaviors, and agitation in addition to paranoia.  Will continue with standing agitation protocol orders, and initiation of home medications.  Patient's home medications include Adderall XR 24-hour capsule 15 mg, bupropion XL 150 mg p.o. every morning, Klonopin 0.5 mg p.o. twice daily as needed, Lexapro 20 mg p.o. daily, modafinil 100 mg p.o. daily, risperidone M tabs 2 mg p.o. nightly, and zolpidem 5 mg p.o. nightly as needed.  Patient does appear to be on several classes of psychotropic medication careful review of PDMP shows patient has not been receiving Klonopin in the past 90 days, will place this medication on hold until further clarification is sought.  Suspect Adderall bupropion in combination are very activating, which may be contributing to her psychosis, paranoia, and increasing levels of aggression. -Will continue to monitor at this time.

## 2021-10-05 NOTE — ED Notes (Signed)
Patient awakened demanding to be assessed now.  Patient states, "These medications are not working.  I don't know how much money she is paying you."

## 2021-10-05 NOTE — ED Notes (Signed)
Mother called wanting to be contacted concerning patient's condition.  Mother feels she is incompetent to make medical decisions.

## 2021-10-05 NOTE — BH Assessment (Signed)
@  2226, TTS requested nursing to move the machine to patient's room. Per nursing, patient is uncooperative at this time. Nursing suggested to try back at a later time.
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
## 2021-10-05 NOTE — ED Notes (Signed)
Pt has refused meals

## 2021-10-05 NOTE — ED Notes (Signed)
Pharmacy attempted to perform med reconciliation.  Patient refusing to cooperative.

## 2021-10-06 LAB — URINALYSIS, ROUTINE W REFLEX MICROSCOPIC
Bilirubin Urine: NEGATIVE
Glucose, UA: NEGATIVE mg/dL
Hgb urine dipstick: NEGATIVE
Ketones, ur: 80 mg/dL — AB
Leukocytes,Ua: NEGATIVE
Nitrite: NEGATIVE
Protein, ur: 30 mg/dL — AB
Specific Gravity, Urine: 1.019 (ref 1.005–1.030)
pH: 6 (ref 5.0–8.0)
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
## 2021-10-06 NOTE — ED Provider Notes (Signed)
Emergency Medicine Observation Re-evaluation Note  Ana Best is a 35 y.o. female, seen on rounds today.  Pt initially presented to the ED for complaints of Psychiatric Evaluation Currently, the patient is resting comfortably.  Physical Exam  BP 118/73 (BP Location: Right Arm)   Pulse 74   Temp 98 F (36.7 C) (Oral)   Resp 17   Ht 5\' 7"  (1.702 m)   Wt 81 kg   SpO2 95%   BMI 27.97 kg/m  Physical Exam General: Nontoxic appearance Cardiac: Normal heart rate Lungs: Normal respiratory rate Psych: Not currently internally responsive  ED Course / MDM  EKG:EKG Interpretation  Date/Time:  Tuesday October 05 2021 12:10:16 EST Ventricular Rate:  58 PR Interval:  136 QRS Duration: 82 QT Interval:  442 QTC Calculation: 433 R Axis:   76 Text Interpretation: Sinus bradycardia Anterior infarct , age undetermined Abnormal ECG No acute changes No significant change since last tracing Confirmed by [birth year]1985 [phone]) on 10/05/2021 12:50:59 PM  I have reviewed the labs performed to date as well as medications administered while in observation.  Recent changes in the last 24 hours include she has remained cooperative.  Plan  Current plan is for psychiatric placement. Ana Best is under involuntary commitment.      Ana Brightly, MD 10/06/21 [phone]

## 2021-10-06 NOTE — Progress Notes (Addendum)
CSW faxed requested UA requested and last/updated clinical documentation to [facility] [phone]. This CSW will follow via phone and continue to assist with inpatient behavioral health placement.  CSW spoke with intake and pt has been denied due to their hospital's high acuity. CSW will continue to seek inpatient behavioral health for pt.   Care Team notified via secure chat: Melynda Ripple, Counselor, Jerilee Hoh, RN, Caryn Bee, and Lum Babe, RN.   Maryjean Ka, MSW, [facility] 10/06/2021 5:31 PM

## 2021-10-06 NOTE — ED Notes (Signed)
Patient has been alert during shift. Patient mostly sleeping and withdrawn. Patient cooperative with staff. Patient medication compliant this AM except refused adderal. Patient has not eaten today but is drinking.  Pt can ambulate and complete ADLs

## 2021-10-06 NOTE — Progress Notes (Signed)
Patient has been denied by [facility] due to bed availability and staff. Patient meets BH inpatient criteria per Caryn Bee, FNP. Patient has been faxed out to the following facilities:    [facility]  [address], [place] [address] [phone]  [facility]  [address], [place] [address] [phone]  [facility]  [address] [place] [address] [phone]  [facility]  [address] [place] [address] [phone]  [facility]  [address] [place] [address] [phone]  [facility]  [address] [place], [place] [address] [phone]  [facility]  [address] [place], [place] [address] [phone]  [facility]  [phone] [place] [address] [phone]  [facility]  [address] [place] [address] [phone]  [facility]  [address], [place] [address] [phone]  [facility]  [address] [place] [address] [phone]  [facility]  [address] [place] [address] [phone]   Damita Dunnings, MSW, LCSW-A  11:15 AM 10/06/2021

## 2021-10-06 NOTE — BH Assessment (Signed)
Clinician sent a secure internal message to pt's nurse at [facility] in an effort to complete pt's MH Assessment. Clinician had not received a response by 0226 so requested pt's nurse contact clinician if/when pt is ready to be seen so TTS can complete her assessment.

## 2021-10-07 LAB — CBC
HCT: 39.4 % (ref 36.0–46.0)
Hemoglobin: 13.1 g/dL (ref 12.0–15.0)
MCH: 29.5 pg (ref 26.0–34.0)
MCHC: 33.2 g/dL (ref 30.0–36.0)
MCV: 88.7 fL (ref 80.0–100.0)
Platelets: 306 10*3/uL (ref 150–400)
RBC: 4.44 MIL/uL (ref 3.87–5.11)
RDW: 12.2 % (ref 11.5–15.5)
WBC: 9 10*3/uL (ref 4.0–10.5)
nRBC: 0 % (ref 0.0–0.2)

## 2021-10-07 LAB — BASIC METABOLIC PANEL
Anion gap: 8 (ref 5–15)
BUN: 8 mg/dL (ref 6–20)
CO2: 27 mmol/L (ref 22–32)
Calcium: 8.9 mg/dL (ref 8.9–10.3)
Chloride: 101 mmol/L (ref 98–111)
Creatinine, Ser: 0.59 mg/dL (ref 0.44–1.00)
GFR, Estimated: >60 mL/min (ref >60)
Glucose, Bld: 93 mg/dL (ref 70–99)
Potassium: 3.6 mmol/L (ref 3.5–5.1)
Sodium: 136 mmol/L (ref 135–145)
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
## 2021-10-07 NOTE — Progress Notes (Signed)
Pt was accepted to [facility] 10/07/21 ; Room Assignment 402-2  Pt meets inpatient criteria per Caryn Bee, FNP  Attending Physician will be: Massengill, MD.  Report can be called to:  -Adult unit: [phone]  Pt can arrive after 2130  Care Team notified via secure chat: Rona Ravens, RN, Caryn Bee, FNP, and Lum Babe, RN. CSW requested that nursing coordinate transportation.   Kelton Pillar, LCSWA 10/07/2021 @ 5:23 PM

## 2021-10-07 NOTE — Progress Notes (Signed)
CSW notified that placement of pt has been pt on hold due to a pt test positive for COVID-19 on the unit. CSW will continue to assist and follow.  Care Team updated via secure chat: Rona Ravens, RN, Caryn Bee, FNP, Lum Babe, RN, and Gretta Arab, RN.   Maryjean Ka, MSW, [facility] 10/07/2021 7:43 PM

## 2021-10-07 NOTE — ED Notes (Signed)
Patient has been alert this shift. Patient mostly sleeping and guarded. Patient appropriate with staff. Patient has paranoid thoughts.  Denied SI and HI.

## 2021-10-07 NOTE — ED Notes (Signed)
Patient resting comfortably.   No s/s of distress. Medication compliant except adderall .

## 2021-10-08 ENCOUNTER — Encounter (HOSPITAL_COMMUNITY): Payer: Self-pay | Admitting: Psychiatry

## 2021-10-08 ENCOUNTER — Other Ambulatory Visit: Payer: Self-pay

## 2021-10-08 ENCOUNTER — Inpatient Hospital Stay (HOSPITAL_COMMUNITY)
Admission: AD | Admit: 2021-10-08 | Discharge: 2021-10-15 | DRG: 885 | Disposition: A | Discharge status: Home / Self Care | Payer: Medicaid Other | Source: Intra-hospital | Attending: Psychiatry | Admitting: Psychiatry

## 2021-10-08 DIAGNOSIS — F17200 Nicotine dependence, unspecified, uncomplicated: Secondary | ICD-10-CM | POA: Diagnosis present

## 2021-10-08 DIAGNOSIS — F1220 Cannabis dependence, uncomplicated: Secondary | ICD-10-CM | POA: Diagnosis present

## 2021-10-08 DIAGNOSIS — F259 Schizoaffective disorder, unspecified: Secondary | ICD-10-CM | POA: Diagnosis present

## 2021-10-08 DIAGNOSIS — F17290 Nicotine dependence, other tobacco product, uncomplicated: Secondary | ICD-10-CM | POA: Diagnosis present

## 2021-10-08 DIAGNOSIS — F900 Attention-deficit hyperactivity disorder, predominantly inattentive type: Secondary | ICD-10-CM | POA: Diagnosis present

## 2021-10-08 DIAGNOSIS — G4700 Insomnia, unspecified: Secondary | ICD-10-CM | POA: Diagnosis present

## 2021-10-08 DIAGNOSIS — Z79899 Other long term (current) drug therapy: Secondary | ICD-10-CM

## 2021-10-08 DIAGNOSIS — F1520 Other stimulant dependence, uncomplicated: Secondary | ICD-10-CM | POA: Diagnosis present

## 2021-10-08 DIAGNOSIS — F250 Schizoaffective disorder, bipolar type: Secondary | ICD-10-CM | POA: Diagnosis not present

## 2021-10-08 DIAGNOSIS — Z20822 Contact with and (suspected) exposure to covid-19: Secondary | ICD-10-CM | POA: Diagnosis present

## 2021-10-08 DIAGNOSIS — R454 Irritability and anger: Secondary | ICD-10-CM | POA: Diagnosis not present

## 2021-10-08 DIAGNOSIS — Z818 Family history of other mental and behavioral disorders: Secondary | ICD-10-CM | POA: Diagnosis not present

## 2021-10-08 DIAGNOSIS — F3181 Bipolar II disorder: Secondary | ICD-10-CM | POA: Diagnosis not present

## 2021-10-08 DIAGNOSIS — R4587 Impulsiveness: Secondary | ICD-10-CM | POA: Diagnosis present

## 2021-10-08 DIAGNOSIS — Z781 Physical restraint status: Secondary | ICD-10-CM

## 2021-10-08 DIAGNOSIS — I10 Essential (primary) hypertension: Secondary | ICD-10-CM | POA: Diagnosis present

## 2021-10-08 DIAGNOSIS — F411 Generalized anxiety disorder: Secondary | ICD-10-CM | POA: Diagnosis present

## 2021-10-08 DIAGNOSIS — F19959 Other psychoactive substance use, unspecified with psychoactive substance-induced psychotic disorder, unspecified: Secondary | ICD-10-CM | POA: Diagnosis present

## 2021-10-08 DIAGNOSIS — F3160 Bipolar disorder, current episode mixed, unspecified: Secondary | ICD-10-CM | POA: Diagnosis present

## 2021-10-08 DIAGNOSIS — F3113 Bipolar disorder, current episode manic without psychotic features, severe: Secondary | ICD-10-CM

## 2021-10-08 DIAGNOSIS — Z9114 Patient's other noncompliance with medication regimen: Secondary | ICD-10-CM

## 2021-10-08 DIAGNOSIS — F39 Unspecified mood [affective] disorder: Secondary | ICD-10-CM | POA: Diagnosis present

## 2021-10-08 LAB — RESP PANEL BY RT-PCR (FLU A&B, COVID) ARPGX2
Influenza A by PCR: NEGATIVE
Influenza B by PCR: NEGATIVE
SARS Coronavirus 2 by RT PCR: NEGATIVE

## 2021-10-08 MED ORDER — MODAFINIL 200 MG PO TABS
100.0000 mg | ORAL_TABLET | Freq: Every day | ORAL | Status: DC
  Administered 2021-10-08 – 2021-10-09 (×2): 100 mg via ORAL
  Filled 2021-10-08 (×2): qty 1

## 2021-10-08 MED ORDER — RISPERIDONE 2 MG PO TBDP: 2.0000 mg | ORAL_TABLET | Freq: Three times a day (TID) | ORAL | Status: DC | PRN

## 2021-10-08 MED ORDER — ACETAMINOPHEN 325 MG PO TABS: 650.0000 mg | ORAL_TABLET | Freq: Four times a day (QID) | ORAL | Status: DC | PRN

## 2021-10-08 MED ORDER — MAGNESIUM HYDROXIDE 400 MG/5ML PO SUSP: 30.0000 mL | Freq: Every day | ORAL | Status: DC | PRN

## 2021-10-08 MED ORDER — NICOTINE POLACRILEX 2 MG MT GUM: 2.0000 mg | CHEWING_GUM | OROMUCOSAL | Status: DC | PRN

## 2021-10-08 MED ORDER — AMPHETAMINE-DEXTROAMPHETAMINE 20 MG PO TABS: 20.0000 mg | ORAL_TABLET | Freq: Two times a day (BID) | ORAL | Status: DC

## 2021-10-08 MED ORDER — AMPHETAMINE-DEXTROAMPHETAMINE 10 MG PO TABS
20.0000 mg | ORAL_TABLET | Freq: Two times a day (BID) | ORAL | Status: DC
  Administered 2021-10-08: 20 mg via ORAL
  Filled 2021-10-08 (×2): qty 2

## 2021-10-08 MED ORDER — RISPERIDONE 2 MG PO TBDP
2.0000 mg | ORAL_TABLET | Freq: Every day | ORAL | Status: DC
  Administered 2021-10-08: 2 mg via ORAL
  Filled 2021-10-08 (×4): qty 1

## 2021-10-08 MED ORDER — ZIPRASIDONE MESYLATE 20 MG IM SOLR: 20.0000 mg | INTRAMUSCULAR | Status: DC | PRN

## 2021-10-08 MED ORDER — HYDROXYZINE HCL 25 MG PO TABS
25.0000 mg | ORAL_TABLET | Freq: Three times a day (TID) | ORAL | Status: DC | PRN
  Administered 2021-10-08 – 2021-10-11 (×3): 25 mg via ORAL
  Filled 2021-10-08 (×5): qty 1

## 2021-10-08 MED ORDER — BUPROPION HCL ER (XL) 150 MG PO TB24
150.0000 mg | ORAL_TABLET | Freq: Every day | ORAL | Status: DC
  Administered 2021-10-09 – 2021-10-15 (×7): 150 mg via ORAL
  Filled 2021-10-08 (×11): qty 1

## 2021-10-08 MED ORDER — TRAZODONE HCL 50 MG PO TABS
50.0000 mg | ORAL_TABLET | Freq: Every evening | ORAL | Status: DC | PRN
  Administered 2021-10-08 – 2021-10-11 (×2): 50 mg via ORAL
  Filled 2021-10-08 (×4): qty 1

## 2021-10-08 MED ORDER — ALUM & MAG HYDROXIDE-SIMETH 200-200-20 MG/5ML PO SUSP: 30.0000 mL | ORAL | Status: DC | PRN

## 2021-10-08 MED ORDER — ESCITALOPRAM OXALATE 20 MG PO TABS
20.0000 mg | ORAL_TABLET | Freq: Every day | ORAL | Status: DC
  Administered 2021-10-08 – 2021-10-09 (×2): 20 mg via ORAL
  Filled 2021-10-08: qty 2
  Filled 2021-10-08 (×4): qty 1
  Filled 2021-10-08: qty 2

## 2021-10-08 MED ORDER — LORAZEPAM 1 MG PO TABS: 1.0000 mg | ORAL_TABLET | ORAL | Status: DC | PRN

## 2021-10-08 NOTE — ED Notes (Signed)
Patient in shower.  Staff changed linen.

## 2021-10-08 NOTE — ED Notes (Signed)
Patient walked naked through the unit from the shower to her room.
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
## 2021-10-08 NOTE — Progress Notes (Signed)
Pt educated on her cheeking medications, pt was informed that if we don't believe she is taking the medications , she would probably receive IMs . Pt stated she did not want to receive IMs, so she stated she would be compliant with her medications, pt informed mouth checks would be done regularly. Pt given PRN Trazodone and Vistaril per [facility] with HS medication , pt appeared to swallow medications without incident      10/08/21 2100  Psych Admission Type (Psych Patients Only)  Admission Status Involuntary  Psychosocial Assessment  Patient Complaints Anxiety;Restlessness  Eye Contact Fair  Facial Expression Animated;Anxious;Pensive;Worried  Affect Anxious;Preoccupied  Speech Logical/coherent;Pressured  Interaction Assertive  Motor Activity Restless  Appearance/Hygiene Bizarre;Disheveled;In scrubs  Behavior Characteristics Anxious  Mood Anxious;Suspicious;Pleasant  Thought Administrator, sports thinking  Content Blaming others;Paranoia  Delusions Paranoid  Perception WDL  Hallucination None reported or observed  Judgment Poor  Confusion None  Danger to Self  Current suicidal ideation? Denies  Danger to Others  Danger to Others None reported or observed
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
## 2021-10-08 NOTE — ED Notes (Signed)
Patient had been repeatedly asking for her Adderall. When medication was offered to the patient, she at first putit in her mouth and pretended to swallow the medication. When I asked her to open her mouth and lift her tongue, she spit out one of the pills. She stated that it didn't look like her Adderall, I tried to explain to her that the medications make look different from what she had at home but it was the same. Then she refused to give me the pill she had spit out, stating she was going to get it tested by the police. I had to have security to come to the bedside. I got mostly whole pill, and one pill that was ~ 50% dissolved. Patient now states she wants to leave. Informed patient she was under IVC and could not leave, that social worker was working on placement for her.

## 2021-10-08 NOTE — ED Notes (Signed)
Pt childs father here and this nurse has been made aware that he is going to tell her that he is taking custody of their child. This nurse spoke with him and made him aware that this was not the place to speak with her about this. Made him aware that this would not be beneficial for the patient at this point in time to hear. He has agreed to not speak about this to the patient.

## 2021-10-08 NOTE — [facility] Group Notes (Signed)
Pt didn't attend group. 

## 2021-10-08 NOTE — ED Notes (Signed)
Attempted to obtain sample for COVID test, patient initially refused. I explained that a test will be necessary for placement and she consented. When I tried to obtain the sample, patient pulled her head back and said no.

## 2021-10-08 NOTE — ED Provider Notes (Signed)
  Physical Exam  BP 121/77 (BP Location: Left Arm)   Pulse 72   Temp 98.1 F (36.7 C) (Oral)   Resp 18   Ht 5\' 7"  (1.702 m)   Wt 81 kg   SpO2 98%   BMI 27.97 kg/m   Physical Exam  ED Course/Procedures     Procedures  MDM  Please see previous notes for hx, physical and care. Hx of bipolar, polysubstance abuse IVC by mother for hostile behavior, hallucinating.  Evaluated and plan for inpt.   To [facility] if COVID negative.     [facility], MD 10/08/21 [phone]

## 2021-10-08 NOTE — [facility] Group Notes (Signed)
Patient did not attend group. ° ° °Spirituality group facilitated by Chaplain Katy Alvera Tourigny, BCC.  ° °Group Description: Group focused on topic of hope. Patients participated in facilitated discussion around topic, connecting with one another around experiences and definitions for hope. Group members engaged with visual explorer photos, reflecting on what hope looks like for them today. Group engaged in discussion around how their definitions of hope are present today in hospital.  ° °Modalities: Psycho-social ed, Adlerian, Narrative, MI  ° ° °

## 2021-10-08 NOTE — ED Notes (Signed)
Patient continues to refuse to have vital signs taken.

## 2021-10-08 NOTE — ED Notes (Addendum)
Patient irritable and verbally aggressive with staff because she's "ready to go home."  Writer successful in deescalating patient.

## 2021-10-08 NOTE — Progress Notes (Signed)
Pt was accept to [facility] 10/08/21; Bed Assignment 508  Pt meets inpatient criteria per Inez Catalina  Attending Physician will be Dr. Sherron Flemings  Report can be called to: -Adult unit: [phone]  Care Team notified via secure chat: Caryn Bee, FNP, Roddie Mc, RN, Bernarda Caffey, RN, [facility] Malva Limes, RN, Maxie Barb, NP, and Oneta Rack Pflueger, RN.  Kelton Pillar, LCSWA 10/08/2021 @ 6:56 PM

## 2021-10-08 NOTE — ED Notes (Signed)
Patient refused her medications. States she wants her Adderall, explained that pharmacy is trying to verify her dosage.

## 2021-10-09 DIAGNOSIS — F250 Schizoaffective disorder, bipolar type: Secondary | ICD-10-CM

## 2021-10-09 DIAGNOSIS — F1220 Cannabis dependence, uncomplicated: Secondary | ICD-10-CM | POA: Diagnosis present

## 2021-10-09 LAB — LIPID PANEL
Cholesterol: 148 mg/dL (ref 0–200)
HDL: 41 mg/dL (ref >40)
LDL Cholesterol: 93 mg/dL (ref 0–99)
Total CHOL/HDL Ratio: 3.6 RATIO
Triglycerides: 68 mg/dL (ref <150)
VLDL: 14 mg/dL (ref 0–40)

## 2021-10-09 LAB — HEMOGLOBIN A1C
Hgb A1c MFr Bld: 4.9 % (ref 4.8–5.6)
Mean Plasma Glucose: 93.93 mg/dL

## 2021-10-09 LAB — TSH: TSH: 1.572 u[IU]/mL (ref 0.350–4.500)

## 2021-10-09 MED ORDER — NICOTINE 21 MG/24HR TD PT24
21.0000 mg | MEDICATED_PATCH | Freq: Every day | TRANSDERMAL | Status: DC
  Administered 2021-10-09 – 2021-10-10 (×2): 21 mg via TRANSDERMAL
  Filled 2021-10-09 (×4): qty 1

## 2021-10-09 MED ORDER — CARIPRAZINE HCL 1.5 MG PO CAPS
1.5000 mg | ORAL_CAPSULE | Freq: Every day | ORAL | Status: DC
  Administered 2021-10-09 – 2021-10-15 (×7): 1.5 mg via ORAL
  Filled 2021-10-09 (×11): qty 1

## 2021-10-09 NOTE — [facility] Group Notes (Signed)
[facility] Group Notes:  (Nursing/MHT/Case Management/Adjunct)  Date:  10/09/2021  Time:  12:00 PM  Type of Therapy:   Orientation/Goals group  Participation Level:  Active  Participation Quality:  Appropriate  Affect:  Appropriate  Cognitive:  Appropriate  Insight:  Appropriate  Engagement in Group:  Engaged  Modes of Intervention:  Discussion, Orientation, and Support  Summary of Progress/Problems: Pt goal for today is to be happy, feeling content and at ease.   Donna Snooks J Takuya Lariccia 10/09/2021, 12:00 PM

## 2021-10-09 NOTE — Progress Notes (Signed)
Patient has been asleep since shift change. Respirations even and unlabored, no distress noted. Safety maintained with 15 min checks.

## 2021-10-09 NOTE — [facility] Group Notes (Signed)
.  Psychoeducational Group Note  Date 10/09/2021 Time: 0900-1000    Goal Setting   Purpose of Group: Group Focus: affirmation, clarity of thought, and goals/reality orientation Treatment Modality:  Psychoeducation Interventions utilized were assignment, group exercise, and support  Purpose: To be able to understand and verbalize the reason for their admission to the hospital. To understand that the medication helps with their chemical imbalance but they also need to work on their choices in life. To be challenged to develop a list of 30 positives about themselves. Also introduce the concept that "feelings" are not reality.    Participation Level:  Active  Participation Quality:  Appropriate  Affect:  Appropriate  Cognitive:  Appropriate  Insight:  Improving  Engagement in Group:  Engaged  Additional Comments: Pt rates her energy at a 4/10. States she was committed by a her mother. States that no one is listening to her and she is being given medication that she "has been off of for years"  Dione Housekeeper
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
## 2021-10-09 NOTE — Group Note (Signed)
  [facility] LCSW Group Therapy Note  Date/Time:  10/09/2021 11:15AM-12:00PM  Type of Therapy and Topic:  Group Therapy:  Feelings About Hospitalization  Participation Level:  Active   Description of Group This process group involved patients discussing their feelings related to being hospitalized, as well as the benefits they see to being in the hospital.  These feelings and benefits were itemized.  The group then brainstormed specific ways in which they could seek those same benefits when they discharge and return home.  Therapeutic Goals Patient will identify and describe positive and negative feelings related to hospitalization Patient will verbalize benefits of hospitalization to themselves personally Patients will brainstorm together ways they can obtain similar benefits in the outpatient setting, identify barriers to wellness and possible solutions  Summary of Patient Progress:  The patient expressed her primary feelings about being hospitalized are that she is "not thrilled" but she does want to get her medications regulated so she can go home.  She does feel that people, specifically doctors, need to listen to her in order for this goal to be accomplished.  She soothed another patient who was having a panic attack and was very helpful to that other patient, was given positive feedback from CSW for this ability.  She was receptive to the idea that much of her aftercare plan has worked, and she only has to develop a new plan for the parts that did not work.  She also was receptive to the explanation that the doctors' job is quite difficult.  CSW explained that doctors are not prescribing medicines that are sedating merely to sedate patients, but rather in order to address symptoms that are being reported, and that the only way to see what medicine will be effective is to try them.  She really appreciated being listened to.  Therapeutic Modalities Cognitive Behavioral Therapy Motivational  Interviewing    Ambrose Mantle, LCSW 10/09/2021, 12:02 PM

## 2021-10-09 NOTE — [facility] Group Notes (Signed)
Pt didn't attend group. 

## 2021-10-10 LAB — RESP PANEL BY RT-PCR (FLU A&B, COVID) ARPGX2
Influenza A by PCR: NEGATIVE
Influenza B by PCR: NEGATIVE
SARS Coronavirus 2 by RT PCR: NEGATIVE

## 2021-10-10 MED ORDER — WHITE PETROLATUM EX OINT
TOPICAL_OINTMENT | CUTANEOUS | Status: AC
  Administered 2021-10-10: 1
  Filled 2021-10-10: qty 5

## 2021-10-10 MED ORDER — NICOTINE POLACRILEX 2 MG MT GUM
2.0000 mg | CHEWING_GUM | OROMUCOSAL | Status: DC | PRN
  Administered 2021-10-10 – 2021-10-15 (×9): 2 mg via ORAL
  Filled 2021-10-10 (×4): qty 1

## 2021-10-10 NOTE — [facility] Group Notes (Signed)
Adult Psychoeducational Group Not Date:  10/10/2021 Time:  0900-1045 Group Topic/Focus: PROGRESSIVE RELAXATION. A group where deep breathing is taught and tensing and relaxation muscle groups is used. Imagery is used as well.  Pts are asked to imagine 3 pillars that hold them up when they are not able to hold themselves up.  Participation Level:  Active  Participation Quality:  Appropriate  Affect:  Appropriate  Cognitive:  Oriented  Insight: Improving  Engagement in Group:  Engaged  Modes of Intervention:  Activity, Discussion, Education, and Support  Additional Comments:  Rates her energy at a 10/10. Participated fully in the group.  Dione Housekeeper

## 2021-10-10 NOTE — Progress Notes (Signed)
[facility] MD Progress Note  10/10/2021 3:46 PM Ana Best  MRN:  [id number]  Subjective: Ana Best reports, "I feel great today. I'm just disappointed because today is my son's birthday & I'm not going to be there for his party. I'm not depressed or anxious, doing well on my medicines".  Daily notes: Ana Best is seen, chart reviewed. The chart findings discussed with the treatment team. She presents alert, oriented & aware of situation. She is visible on the unit, attending group sessions. She presents with a good affect, good eye contact. She denies any symptoms of depression or anxiety. She denies any SIHI, AVH, delusional thoughts or paranoia. She does not appear to be responding to any internal stimuli. She is taking & tolerating her treatment regimen. Denies any side effects. No changes made on her current plan of care. We will continue plan of care as already in progress.  Reason for admission: This is the second psychiatric admission in this [facility] for this 35 year old Caucasian female with hx of mental illness, substance use disorder & other previous psychiatric admissions. She is being admitted to the [facility] under IVC this time around for complain of worsening paranoia & medication non-compliance. Her UDS was positive for Amphetamine & THC.   Principal Problem: Schizoaffective disorder (HCC)  Diagnosis: Principal Problem:   Schizoaffective disorder (HCC) Active Problems:   Attention deficit hyperactivity disorder (ADHD), predominantly inattentive type   Mood disorder (HCC)   Nicotine addiction   Difficulty controlling anger   Amphetamine dependence (HCC)   Moderate cannabis use disorder (HCC)  Total Time spent with patient:  35 minutes  Past Psychiatric History: See H&P  Past Medical History:  Past Medical History:  Diagnosis Date   Anxiety    Bipolar 1 disorder (HCC)    Depression    Hypertension     Past Surgical History:  Procedure Laterality Date   DILATION AND CURETTAGE OF  UTERUS     TONSILLECTOMY     WISDOM TOOTH EXTRACTION     Family History:  Family History  Problem Relation Age of Onset   Depression Father    Anxiety disorder Father    ADD / ADHD Brother    Family Psychiatric  History: See H&P.   Social History:  Social History   Substance and Sexual Activity  Alcohol Use Yes   Comment: drinks- 3-4 beers monthly     Social History   Substance and Sexual Activity  Drug Use No    Social History   Socioeconomic History   Marital status: Single    Spouse name: Not on file   Number of children: Not on file   Years of education: Not on file   Highest education level: Not on file  Occupational History   Not on file  Tobacco Use   Smoking status: Former    Packs/day: 0.50    Years: 10.00    Pack years: 5.00    Types: Cigarettes    Quit date: 03/08/2014    Years since quitting: 7.5   Smokeless tobacco: Never  Vaping Use   Vaping Use: Every day   Substances: Nicotine  Substance and Sexual Activity   Alcohol use: Yes    Comment: drinks- 3-4 beers monthly   Drug use: No   Sexual activity: Yes    Partners: Male    Comment: number of sex partners in the last 12 months  1  Other Topics Concern   Not on file  Social History Narrative  Not on file   Social Determinants of Health   Financial Resource Strain: Not on file  Food Insecurity: Not on file  Transportation Needs: Not on file  Physical Activity: Not on file  Stress: Not on file  Social Connections: Not on file   Additional Social History:   Sleep: Good  Appetite:  Good  Current Medications: Current Facility-Administered Medications  Medication Dose Route Frequency Provider Last Rate Last Admin   acetaminophen (TYLENOL) tablet 650 mg  650 mg Oral Q6H PRN Novella Olive, NP       alum & mag hydroxide-simeth (MAALOX/MYLANTA) 200-200-20 MG/5ML suspension 30 mL  30 mL Oral Q4H PRN Novella Olive, NP       buPROPion (WELLBUTRIN XL) 24 hr tablet 150 mg  150 mg Oral  Daily Novella Olive, NP   150 mg at 10/10/21 1610   cariprazine (VRAYLAR) capsule 1.5 mg  1.5 mg Oral Daily Armandina Stammer I, NP   1.5 mg at 10/10/21 9604   hydrOXYzine (ATARAX/VISTARIL) tablet 25 mg  25 mg Oral TID PRN Novella Olive, NP   25 mg at 10/08/21 2044   risperiDONE (RISPERDAL M-TABS) disintegrating tablet 2 mg  2 mg Oral Q8H PRN Novella Olive, NP       And   LORazepam (ATIVAN) tablet 1 mg  1 mg Oral PRN Novella Olive, NP       And   ziprasidone (GEODON) injection 20 mg  20 mg Intramuscular PRN Novella Olive, NP       magnesium hydroxide (MILK OF MAGNESIA) suspension 30 mL  30 mL Oral Daily PRN Novella Olive, NP       nicotine (NICODERM CQ - dosed in mg/24 hours) patch 21 mg  21 mg Transdermal Daily Armandina Stammer I, NP   21 mg at 10/10/21 0806   traZODone (DESYREL) tablet 50 mg  50 mg Oral QHS PRN Novella Olive, NP   50 mg at 10/08/21 2043   Lab Results:  Results for orders placed or performed during the hospital encounter of 10/08/21 (from the past 48 hour(s))  Hemoglobin A1c     Status: None   Collection Time: 10/09/21  6:28 AM  Result Value Ref Range   Hgb A1c MFr Bld 4.9 4.8 - 5.6 %    Comment: (NOTE) Pre diabetes:          5.7%-6.4%  Diabetes:              >6.4%  Glycemic control for   <7.0% adults with diabetes    Mean Plasma Glucose 93.93 mg/dL    Comment: Performed at [facility], [address], [place], [place] [address]  Lipid panel     Status: None   Collection Time: 10/09/21  6:28 AM  Result Value Ref Range   Cholesterol 148 0 - 200 mg/dL   Triglycerides 68 <119 mg/dL   HDL 41 >14 mg/dL   Total CHOL/HDL Ratio 3.6 RATIO   VLDL 14 0 - 40 mg/dL   LDL Cholesterol 93 0 - 99 mg/dL    Comment:        Total Cholesterol/HDL:CHD Risk Coronary Heart Disease Risk Table                     Men   Women  1/2 Average Risk   3.4   3.3  Average Risk       5.0   4.4  2 X  Average Risk   9.6   7.1  3 X Average Risk  23.4   11.0        Use the calculated  Patient Ratio above and the CHD Risk Table to determine the patient's CHD Risk.        ATP III CLASSIFICATION (LDL):  <100     mg/dL   Optimal  366-294  mg/dL   Near or Above                    Optimal  130-159  mg/dL   Borderline  765-465  mg/dL   High  >035     mg/dL   Very High Performed at [facility], [address], [place], [place] [address]   TSH     Status: None   Collection Time: 10/09/21  6:28 AM  Result Value Ref Range   TSH 1.572 0.350 - 4.500 uIU/mL    Comment: Performed by a 3rd Generation assay with a functional sensitivity of <=0.01 uIU/mL. Performed at [facility], [address] [place] [address], [place], [place] [address]   Resp Panel by RT-PCR (Flu A&B, Covid) Nasopharyngeal Swab     Status: None   Collection Time: 10/10/21  4:21 AM   Specimen: Nasopharyngeal Swab; Nasopharyngeal(NP) swabs in vial transport medium  Result Value Ref Range   SARS Coronavirus 2 by RT PCR NEGATIVE NEGATIVE    Comment: (NOTE) SARS-CoV-2 target nucleic acids are NOT DETECTED.  The SARS-CoV-2 RNA is generally detectable in upper respiratory specimens during the acute phase of infection. The lowest concentration of SARS-CoV-2 viral copies this assay can detect is 138 copies/mL. A negative result does not preclude SARS-Cov-2 infection and should not be used as the sole basis for treatment or other patient management decisions. A negative result may occur with  improper specimen collection/handling, submission of specimen other than nasopharyngeal swab, presence of viral mutation(s) within the areas targeted by this assay, and inadequate number of viral copies(<138 copies/mL). A negative result must be combined with clinical observations, patient history, and epidemiological information. The expected result is Negative.  Fact Sheet for Patients:  BloggerCourse.com  Fact Sheet for Healthcare Providers:   SeriousBroker.it  This test is no t yet approved or cleared by the [place] FDA and  has been authorized for detection and/or diagnosis of SARS-CoV-2 by FDA under an Emergency Use Authorization (EUA). This EUA will remain  in effect (meaning this test can be used) for the duration of the COVID-19 declaration under Section 564(b)(1) of the Act, 21 U.S.C.section 360bbb-3(b)(1), unless the authorization is terminated  or revoked sooner.       Influenza A by PCR NEGATIVE NEGATIVE   Influenza B by PCR NEGATIVE NEGATIVE    Comment: (NOTE) The Xpert Xpress SARS-CoV-2/FLU/RSV plus assay is intended as an aid in the diagnosis of influenza from Nasopharyngeal swab specimens and should not be used as a sole basis for treatment. Nasal washings and aspirates are unacceptable for Xpert Xpress SARS-CoV-2/FLU/RSV testing.  Fact Sheet for Patients: BloggerCourse.com  Fact Sheet for Healthcare Providers: SeriousBroker.it  This test is not yet approved or cleared by the [place] FDA and has been authorized for detection and/or diagnosis of SARS-CoV-2 by FDA under an Emergency Use Authorization (EUA). This EUA will remain in effect (meaning this test can be used) for the duration of the COVID-19 declaration under Section 564(b)(1) of the Act, 21 U.S.C. section 360bbb-3(b)(1), unless the authorization is terminated or revoked.  Performed at [facility], [address], [place], [place] [address]    Blood Alcohol level:  Lab Results  Component Value Date   [facility] <10 10/05/2021   ETH <10 09/04/2021    Metabolic Disorder Labs: Lab Results  Component Value Date   HGBA1C 4.9 10/09/2021   MPG 93.93 10/09/2021   MPG 99.67 05/28/2020   No results found for: PROLACTIN Lab Results  Component Value Date   CHOL 148 10/09/2021   TRIG 68 10/09/2021   HDL 41 10/09/2021   CHOLHDL 3.6  10/09/2021   VLDL 14 10/09/2021   LDLCALC 93 10/09/2021   LDLCALC 89 05/28/2020   Physical Findings: AIMS: Facial and Oral Movements Muscles of Facial Expression: None, normal Lips and Perioral Area: None, normal Jaw: None, normal Tongue: None, normal,Extremity Movements Upper (arms, wrists, hands, fingers): None, normal Lower (legs, knees, ankles, toes): None, normal, Trunk Movements Neck, shoulders, hips: None, normal, Overall Severity Severity of abnormal movements (highest score from questions above): None, normal Incapacitation due to abnormal movements: None, normal Patient's awareness of abnormal movements (rate only patient's report): No Awareness, Dental Status Current problems with teeth and/or dentures?: No Does patient usually wear dentures?: No  CIWA:    COWS:     Musculoskeletal: Strength & Muscle Tone: within normal limits Gait & Station: normal Patient leans: N/A  Psychiatric Specialty Exam:  Presentation  General Appearance: Disheveled  Eye Contact:Good  Speech:Clear and Coherent; Pressured  Speech Volume:Increased  Handedness:Right  Mood and Affect  Mood:Dysphoric  Affect:Congruent; Labile  Thought Process  Thought Processes:Coherent; Goal Directed  Descriptions of Associations:Intact  Orientation:Full (Time, Place and Person)  Thought Content:Paranoid Ideation  History of Schizophrenia/Schizoaffective disorder:Yes  Duration of Psychotic Symptoms:Greater than six months  Hallucinations:Hallucinations: None  Ideas of Reference:Paranoia  Suicidal Thoughts:Suicidal Thoughts: No  Homicidal Thoughts:Homicidal Thoughts: No  Sensorium  Memory:Remote Fair; Immediate Good; Recent Fair  Judgment:Poor  Insight:Shallow  Executive Functions  Concentration:Fair  Attention Span:Fair  Recall:Fair  Fund of Knowledge:Fair  Language:Fair  Psychomotor Activity  Psychomotor Activity:Psychomotor Activity: Increased  Assets   Assets:Communication Skills; Desire for Improvement; Housing; Physical Health; Resilience; Social Support  Sleep  Sleep:Sleep: Good Number of Hours of Sleep: 8.75  Physical Exam: Physical Exam Vitals and nursing note reviewed.  HENT:     Head: Normocephalic.     Nose: Nose normal.     Mouth/Throat:     Pharynx: Oropharynx is clear.  Eyes:     Pupils: Pupils are equal, round, and reactive to light.  Cardiovascular:     Rate and Rhythm: Normal rate.     Pulses: Normal pulses.  Pulmonary:     Effort: Pulmonary effort is normal.  Genitourinary:    Comments: Deferred Musculoskeletal:        General: Normal range of motion.     Cervical back: Normal range of motion.  Skin:    General: Skin is warm and dry.  Neurological:     General: No focal deficit present.     Mental Status: She is alert and oriented to person, place, and time.   Review of Systems  Constitutional:  Negative for chills, diaphoresis and fever.  HENT:  Negative for congestion and sore throat.   Eyes:  Negative for blurred vision.  Respiratory:  Negative for cough, shortness of breath and wheezing.   Cardiovascular:  Negative for chest pain and palpitations.  Gastrointestinal:  Negative for abdominal pain, constipation, diarrhea, heartburn, nausea and vomiting.  Genitourinary:  Negative for dysuria.  Musculoskeletal:  Negative for joint pain and myalgias.  Neurological:  Negative for dizziness, tingling, tremors, sensory change, speech change, focal weakness, seizures, loss of consciousness, weakness and headaches.  Endo/Heme/Allergies:        Allergies: NKDA  Psychiatric/Behavioral:  Positive for substance abuse (UDS (+) for methamphetamine). Negative for depression, hallucinations, memory loss and suicidal ideas. The patient is not nervous/anxious and does not have insomnia.   Blood pressure 109/74, pulse 92, temperature 98.7 F (37.1 C), temperature source Oral, resp. rate 16, height 5\' 7"  (1.702 m),  weight 79.8 kg, SpO2 99 %. Body mass index is 27.57 kg/m.  Treatment Plan Summary: Daily contact with patient to assess and evaluate symptoms and progress in treatment and Medication management.   Continue inpatient hospitalization.  Will continue today 10/10/2021 plan as below except where it is noted.   Diagnosis:  Bipolar 1 disorder.  Generalized anxiety disorder.   Plan.  Bipolar disorder. Continue Vraylar 1.5 mg po daily.  Continue Wellbutrin XL 150 mg po daily.   Anxiety.  Continue Vistaril 25 mg po tid prn.   Insomnia. Continue Trazodone 50 mg po Q hs prn.   Continue other prn medications for other medical complaints.   Agitation protocols.  Continue agitation protocol regimen as recommended prn.                -- Patient's case to be discussed in multi-disciplinary team meetings             -- Observation Level : q15 minute checks             -- Vital signs:  q12 hours             -- Precautions: suicide, elopement, and assault   Discharge Planning:              -- Social work and case management to assist with discharge planning and identification of hospital follow-up needs prior to discharge             -- Estimated LOS: 5-7 days             -- Discharge Concerns: Need to establish a safety plan; Medication compliance/effectiveness.               10/12/2021, NP, pmhnp, fnp-bc. 10/10/2021, 3:46 PM

## 2021-10-10 NOTE — Progress Notes (Addendum)
   10/10/21 1000  Psych Admission Type (Psych Patients Only)  Admission Status Involuntary  Psychosocial Assessment  Patient Complaints None  Eye Contact Fair  Facial Expression Animated  Affect Appropriate to circumstance  Speech Logical/coherent  Interaction Assertive  Motor Activity Other (Comment) (steady gait)  Appearance/Hygiene Unremarkable  Behavior Characteristics Cooperative  Mood Anxious;Pleasant  Thought Process  Coherency Concrete thinking  Content WDL  Delusions None reported or observed  Perception WDL  Hallucination None reported or observed  Judgment Impaired  Confusion None  Danger to Self  Current suicidal ideation? Denies  Danger to Others  Danger to Others None reported or observed  D. Pt presents as friendly, smiles during interactions- observed attending groups this am. Per pt's self inventory, pt rated her depression,hopelessness and anxiety all 0's. Pt stated that her goal today was "to make my son feel special because today is his birthday, and I'm disappointed I am missing his party". Pt currently denies SI/HI and AVH  A. Labs and vitals monitored. Pt compliant with medications. Pt supported emotionally and encouraged to express concerns and ask questions.   R. Pt remains safe with 15 minute checks. Will continue POC.
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
## 2021-10-10 NOTE — BHH Suicide Risk Assessment (Signed)
B[facility] INPATIENT:  Family/Significant Other Suicide Prevention Education  Suicide Prevention Education:  Education Completed; Ana Best (pt's mother) [phone] has been identified by the patient as the family member/significant other with whom the patient will be residing, and identified as the person(s) who will aid the patient in the event of a mental health crisis (suicidal ideations/suicide attempt).  With written consent from the patient, the family member/significant other has been provided the following suicide prevention education, prior to the and/or following the discharge of the patient.  The suicide prevention education provided includes the following: Suicide risk factors Suicide prevention and interventions National Suicide Hotline telephone number [facility] assessment telephone number [facility] Emergency Assistance 911 [facility] and/or Residential Mobile Crisis Unit telephone number  Request made of family/significant other to: Remove weapons (e.g., guns, rifles, knives), all items previously/currently identified as safety concern.   Remove drugs/medications (over-the-counter, prescriptions, illicit drugs), all items previously/currently identified as a safety concern.  The family member/significant other verbalizes understanding of the suicide prevention education information provided.  The family member/significant other agrees to remove the items of safety concern listed above. SHE CONFIRMED THAT PT DOES NOT OWN GUNS/FIREARMS.   COLLATERAL INFORMATION: Pt's mother requested that psychiatrist contact her Monday (preferably after 2pm if possible), as she has serious issues to discuss. She initially did not want to share with anyone but the psychiatrist, but eventually shared details that she wanted kept from patient at this time. Pt's mother reports that she found pt's cat with a hair tie around it's neck and wrapped in a towel in pt's bathtub. Pt is  "an avid animal lover and advocate' and pt's mother thinks that pt did this when she was "psychotic the other night." Pt's mother asked that we not share this with patient at this time, as it will be "absolutely devastating to her." Additionally, pt's mother states, "this raises the bar for what Ana Best is capable of when she is in this mental state or using drugs and I have serious concerns about the safety of my grandchild." Pt's mother reports that pt has a 7yo son who Ana Best helps care for. Pt's mother reports that she completed an application for residential treatment through [facility] in [facility] and is waiting to hear back from them. She asked that the MD share with her whether pt tested positive for Meth, as CSW would not share this information. Pt's mother reports that "she signed some HIPPA form that I could have any medical information including UDS results." Pt's mother was adamant that pt is not to be told that her cat is dead. Pt's mother reports that pt comes from a loving and supportive family. She has been experiencing mental health issues for years and wants to communicate to the MD that "I'm begging you all to do all you can to help her. If we don't get her right this time, I'm afraid we might lose her."   Ana Best, MSW, LCSW 10/10/2021, 12:32 PM

## 2021-10-10 NOTE — Progress Notes (Signed)
Adult Psychoeducational Group Note  Date:  10/10/2021 Time:  8:40 PM  Group Topic/Focus:  Wrap-Up Group:   The focus of this group is to help patients review their daily goal of treatment and discuss progress on daily workbooks.  Participation Level:  Active  Participation Quality:  Appropriate  Affect:  Appropriate  Cognitive:  Appropriate  Insight: Appropriate  Engagement in Group:  Engaged  Modes of Intervention:  Discussion  Additional Comments: Pt stated her goal for today was to focus on her treatment plan. Pt stated she accomplished her goal today. Pt stated she talked with her doctor and her social worker about her care today. Pt rated her overall day a 10. Pt stated she was able to contact her son father, her son, and her mother today which improved her overall day. Pt stated she felt better about herself tonight. Pt stated she was able to attend all groups held today. Pt stated she was able to attend all meals. Pt stated she took all medications provided today. Pt stated her appetite was pretty good today. Pt rated her sleep last night was pretty good. Pt stated the goal tonight was to get some rest. Pt stated she had no physical pain today. Pt deny visual hallucinations and auditory issues tonight. Pt denies thoughts of harming herself or others. Pt stated she would alert staff if anything changed.  Felipa Furnace 10/10/2021, 8:40 PM

## 2021-10-10 NOTE — Progress Notes (Signed)
Patient observed in her room lying in bed after her visitor left tonight. She reports having had a good day and feels that her medications are working for her. She was invited to group after visitation. She denies si/hi/auditory or visual hallucinations.

## 2021-10-10 NOTE — Group Note (Signed)
[facility] LCSW Group Therapy Note  Date/Time:  10/10/2021  11:00AM-12:00PM  Type of Therapy and Topic:  Group Therapy:  Music and Mood  Participation Level:  Active   Description of Group: In this process group, members listened to a variety of genres of music and identified that different types of music evoke different responses.  Patients were encouraged to identify music that was soothing for them and music that was energizing for them.  Patients discussed how this knowledge can help with wellness and recovery in various ways including managing depression and anxiety as well as encouraging healthy sleep habits.    Therapeutic Goals: Patients will explore the impact of different varieties of music on mood Patients will verbalize the thoughts they have when listening to different types of music Patients will identify music that is soothing to them as well as music that is energizing to them Patients will discuss how to use this knowledge to assist in maintaining wellness and recovery Patients will explore the use of music as a coping skill  Summary of Patient Progress:  At the beginning of group, patient expressed that she felt great.  At the end of group, patient expressed that she felt relaxed.    Therapeutic Modalities: Solution Focused Brief Therapy Activity   Ambrose Mantle, LCSW

## 2021-10-11 ENCOUNTER — Encounter (HOSPITAL_COMMUNITY): Payer: Self-pay

## 2021-10-11 DIAGNOSIS — R454 Irritability and anger: Secondary | ICD-10-CM

## 2021-10-11 DIAGNOSIS — F1220 Cannabis dependence, uncomplicated: Secondary | ICD-10-CM

## 2021-10-11 DIAGNOSIS — F1520 Other stimulant dependence, uncomplicated: Secondary | ICD-10-CM

## 2021-10-11 DIAGNOSIS — F900 Attention-deficit hyperactivity disorder, predominantly inattentive type: Secondary | ICD-10-CM

## 2021-10-11 NOTE — Progress Notes (Signed)
Pt encouraged to go to group but didn't attend therapeutic relaxation group. 

## 2021-10-11 NOTE — BH IP Treatment Plan (Signed)
Interdisciplinary Treatment and Diagnostic Plan Update  10/11/2021 Time of Session: 9:50am Ana Best MRN: [id number]  Principal Diagnosis: Schizoaffective disorder ([place])  Secondary Diagnoses: Principal Problem:   Schizoaffective disorder ([place]) Active Problems:   Attention deficit hyperactivity disorder (ADHD), predominantly inattentive type   Mood disorder (HCC)   Nicotine addiction   Difficulty controlling anger   Amphetamine dependence (HCC)   Moderate cannabis use disorder (HCC)   Current Medications:  Current Facility-Administered Medications  Medication Dose Route Frequency Provider Last Rate Last Admin   acetaminophen (TYLENOL) tablet 650 mg  650 mg Oral Q6H PRN Chalmers Guest, NP       alum & mag hydroxide-simeth (MAALOX/MYLANTA) 200-200-20 MG/5ML suspension 30 mL  30 mL Oral Q4H PRN Chalmers Guest, NP       buPROPion (WELLBUTRIN XL) 24 hr tablet 150 mg  150 mg Oral Daily Chalmers Guest, NP   150 mg at 10/11/21 2355   cariprazine (VRAYLAR) capsule 1.5 mg  1.5 mg Oral Daily Lindell Spar I, NP   1.5 mg at 10/11/21 7322   hydrOXYzine (ATARAX/VISTARIL) tablet 25 mg  25 mg Oral TID PRN Chalmers Guest, NP   25 mg at 10/10/21 1629   risperiDONE (RISPERDAL M-TABS) disintegrating tablet 2 mg  2 mg Oral Q8H PRN Chalmers Guest, NP       And   LORazepam (ATIVAN) tablet 1 mg  1 mg Oral PRN Chalmers Guest, NP       And   ziprasidone (GEODON) injection 20 mg  20 mg Intramuscular PRN Chalmers Guest, NP       magnesium hydroxide (MILK OF MAGNESIA) suspension 30 mL  30 mL Oral Daily PRN Chalmers Guest, NP       nicotine polacrilex (NICORETTE) gum 2 mg  2 mg Oral PRN Lindell Spar I, NP   2 mg at 10/11/21 0829   traZODone (DESYREL) tablet 50 mg  50 mg Oral QHS PRN Chalmers Guest, NP   50 mg at 10/08/21 2043   PTA Medications: Medications Prior to Admission  Medication Sig Dispense Refill Last Dose   amphetamine-dextroamphetamine (ADDERALL) 20 MG tablet Take 20 mg by mouth in the  morning, at noon, and at bedtime.   10/08/2021   buPROPion (WELLBUTRIN XL) 150 MG 24 hr tablet Take 1 tablet (150 mg total) by mouth every morning. 30 tablet 2 Past Week   cariprazine (VRAYLAR) 1.5 MG capsule Take 1.5 mg by mouth at bedtime.   10/08/2021   traZODone (DESYREL) 50 MG tablet Take 50 mg by mouth at bedtime as needed for sleep.   10/08/2021    Patient Stressors: Health problems   Marital or family conflict   Medication change or noncompliance   Substance abuse    Patient Strengths: Special hobby/interest  Supportive family/friends   Treatment Modalities: Medication Management, Group therapy, Case management,  1 to 1 session with clinician, Psychoeducation, Recreational therapy.   Physician Treatment Plan for Primary Diagnosis: Schizoaffective disorder ([place]) Long Term Goal(s): Improvement in symptoms so as ready for discharge   Short Term Goals: Ability to identify and develop effective coping behaviors will improve Ability to maintain clinical measurements within normal limits will improve Ability to identify triggers associated with substance abuse/mental health issues will improve Ability to identify changes in lifestyle to reduce recurrence of condition will improve Ability to verbalize feelings will improve Ability to disclose and discuss suicidal ideas Ability to demonstrate self-control will improve  Medication Management: Evaluate patient's  response, side effects, and tolerance of medication regimen.  Therapeutic Interventions: 1 to 1 sessions, Unit Group sessions and Medication administration.  Evaluation of Outcomes: Not Met  Physician Treatment Plan for Secondary Diagnosis: Principal Problem:   Schizoaffective disorder ([place]) Active Problems:   Attention deficit hyperactivity disorder (ADHD), predominantly inattentive type   Mood disorder (HCC)   Nicotine addiction   Difficulty controlling anger   Amphetamine dependence (HCC)   Moderate cannabis use  disorder ([place])  Long Term Goal(s): Improvement in symptoms so as ready for discharge   Short Term Goals: Ability to identify and develop effective coping behaviors will improve Ability to maintain clinical measurements within normal limits will improve Ability to identify triggers associated with substance abuse/mental health issues will improve Ability to identify changes in lifestyle to reduce recurrence of condition will improve Ability to verbalize feelings will improve Ability to disclose and discuss suicidal ideas Ability to demonstrate self-control will improve     Medication Management: Evaluate patient's response, side effects, and tolerance of medication regimen.  Therapeutic Interventions: 1 to 1 sessions, Unit Group sessions and Medication administration.  Evaluation of Outcomes: Not Met   RN Treatment Plan for Primary Diagnosis: Schizoaffective disorder ([place]) Long Term Goal(s): Knowledge of disease and therapeutic regimen to maintain health will improve  Short Term Goals: Ability to remain free from injury will improve, Ability to verbalize frustration and anger appropriately will improve, Ability to demonstrate self-control, Ability to identify and develop effective coping behaviors will improve, and Compliance with prescribed medications will improve  Medication Management: RN will administer medications as ordered by provider, will assess and evaluate patient's response and provide education to patient for prescribed medication. RN will report any adverse and/or side effects to prescribing provider.  Therapeutic Interventions: 1 on 1 counseling sessions, Psychoeducation, Medication administration, Evaluate responses to treatment, Monitor vital signs and CBGs as ordered, Perform/monitor CIWA, COWS, AIMS and Fall Risk screenings as ordered, Perform wound care treatments as ordered.  Evaluation of Outcomes: Not Met   LCSW Treatment Plan for Primary Diagnosis:  Schizoaffective disorder ([place]) Long Term Goal(s): Safe transition to appropriate next level of care at discharge, Engage patient in therapeutic group addressing interpersonal concerns.  Short Term Goals: Engage patient in aftercare planning with referrals and resources, Increase social support, Increase ability to appropriately verbalize feelings, Identify triggers associated with mental health/substance abuse issues, and Increase skills for wellness and recovery  Therapeutic Interventions: Assess for all discharge needs, 1 to 1 time with Social worker, Explore available resources and support systems, Assess for adequacy in community support network, Educate family and significant other(s) on suicide prevention, Complete Psychosocial Assessment, Interpersonal group therapy.  Evaluation of Outcomes: Not Met   Progress in Treatment: Attending groups: Yes. Participating in groups: Yes. Taking medication as prescribed: Yes. Toleration medication: Yes. Family/Significant other contact made: Yes, individual(s) contacted:  with mother Patient understands diagnosis: No. Discussing patient identified problems/goals with staff: Yes. Medical problems stabilized or resolved: Yes. Denies suicidal/homicidal ideation: Yes. Issues/concerns per patient self-inventory: No.   New problem(s) identified: No, Describe:  none  New Short Term/Long Term Goal(s): detox, medication management for mood stabilization; elimination of SI thoughts; development of comprehensive mental wellness/sobriety plan  Patient Goals:  "Regulate meds and balance"  Discharge Plan or Barriers: Pt is to return home at discharge. Patient is to follow up with [facility] for therapy and medication management   Reason for Continuation of Hospitalization: Delusions  Hallucinations Suicidal ideation  Estimated Length of Stay: 3-5 days  Scribe for Treatment Team: Vassie Moselle, LCSW 10/11/2021 11:03 AM

## 2021-10-11 NOTE — Progress Notes (Signed)
Adult Psychoeducational Group Note  Date:  10/11/2021 Time:  11:29 PM  Group Topic/Focus:  Wrap-Up Group:   The focus of this group is to help patients review their daily goal of treatment and discuss progress on daily workbooks.  Participation Level:  Did Not Attend  Participation Quality:   Did Not Attend  Affect:   Did Not Attend  Cognitive:   Did Not Attend  Insight: None  Engagement in Group:   Did Not Attend  Modes of Intervention:   Did Not Attend  Additional Comments:  Pt did not attend evening wrap up group tonight.  Felipa Furnace 10/11/2021, 11:29 PM

## 2021-10-11 NOTE — Progress Notes (Signed)
Pt stated her day was not too good especially after finding out her cat was dead and she may have been responsible . Pt spent much of the evening in her room , ptr requested sleep medication and something for anxiety and pt was given PRN Trazodone and Vistaril per [facility]    10/11/21 2200  Psych Admission Type (Psych Patients Only)  Admission Status Involuntary  Psychosocial Assessment  Patient Complaints Sadness  Eye Contact Fair  Facial Expression Animated  Affect Appropriate to circumstance  Speech Logical/coherent  Interaction Assertive  Motor Activity Other (Comment) (steady gait)  Appearance/Hygiene Unremarkable  Behavior Characteristics Cooperative  Mood Sad  Aggressive Behavior  Effect No apparent injury  Thought Process  Coherency Concrete thinking  Content WDL  Delusions None reported or observed  Perception WDL  Hallucination None reported or observed  Judgment Impaired  Confusion None  Danger to Self  Current suicidal ideation? Denies  Danger to Others  Danger to Others None reported or observed

## 2021-10-11 NOTE — BHH Counselor (Signed)
CSW completed CPS report for this patient in regards to her 37y.o. son who is in her custody.    Ruthann Cancer MSW, LCSW Clincal Social Worker  [facility]

## 2021-10-11 NOTE — Group Note (Signed)
LCSW Group Therapy Note     Type of Therapy and Topic:  Group Therapy: Worry    Participation Level: Did Not Attend   Description of Group:   In this group, patients learned how to define worry. Patients were asked to identify a time they felt anxiety or something that triggers their anxiety. Patients engaged in a matching interactive activity matching bugs, with each match facts about worry was shared. Patients and CSW engaged in discussion surrounding each fact / definition shared. Patients were then asked to explore positive coping mechanisms for worry and discussed things like unrealistic worry, things out of our control and times that they worried about something but actually ended up enjoying it (I.e. Learning to ride a bike). Patients discussed several new ways to handle worry such as music, journaling, thinking about positive endings instead of negative, drawing, happy place, relaxation skills (deep breathing, progressive muscle relaxation and meditation) and engaging in a hobby. CSW asked patients to commit to trying a positive coping skill when faced with worry in the future.   Therapeutic Goals: Patients will recall a time they felt worried or identify something they worry about often. Patients will learn how to define worry.  Patients will learn that some worry cannot be erased, as some things are out of out control and at times our worries are not likely to happen.  Patients will be asked to do some self-reflection with their own worry (throughout the matching activity). Patients will be asked to practice impulse control with the matching game and imagery with the happy place during this group.   Patients were asked to share ways they can cope with anxiety.       Summary of Patient Progress:  Did not attend     Therapeutic Modalities:   Cognitive Behavioral Therapy Motivational Interviewing  Brief Therapy   Kameron Blethen, LCSW, LCAS Clincal Social Worker  [facility]   

## 2021-10-11 NOTE — Progress Notes (Signed)
[facility], [place] MD Progress Note  10/11/2021 4:16 PM Bryann Gentz  MRN:  [id number]  Subjective: Ana Best reports, "I feel great today. I am having fun in here and like going to group. I do not feel depressed or anxious, I am doing well on my medicines".  Evaluation on the unit today, 11/21: Patient is seen, chart reviewed and case discussed with the treatment team. She is alert & oriented and maintains good eye contact. She is smiling and pleasant. She is casually dressed with good hygiene. She stated she slept very well last night but feels a bit tired today. Sleep hours were not recorded in Epic today. She is visible on the unit, attending group sessions. She maintains good eye contact. She denies any symptoms of depression or anxiety. She denies SI/HI, AVH, delusional thoughts or paranoia. She does not appear to be responding to any internal stimuli. She is taking & tolerating her medications and denies any medication side effects. We will continue to monitor for medication effectiveness and safety.   Collateral from patient's mother [name] [phone]. [name] was contacted by this provider, Starleen Blue, NP and Dr Loleta Chance today to discuss her concerns for patient's return home. [name] stated, "I am concerned that my daughter is doing drugs. She lives with her 60 year old son in a house I own but I pay all the bills." [name] stated she found the patient's cat dead in the bathtub with a hair tie around its neck and wrapped in a towel. This happened when the patient was psychotic. [name] wanted this information withheld from the patient until she could be told in therapy. Dr Loleta Chance explained that this was not the best course of action, due to the patient has the right to be told immediately as this was her property. Also for safety purposes the patient should be told while in a safe environment in order to monitor her reaction and safety when told.  [name] stated  she has her 36 year old grandson with her most of the time as her daughter has struggled with mental illness and drug use for some time. [name] was rather insistent she know the results of patient's toxicology report. She stated this would alter how she handled paying the bills and she would not give the patient money. The process of requesting medical records was explained. [name] is not her legal guardian. [name] stated she only wants to help her daughter.  Addendum to patients daily evaluation note:  Patient was presented with the information about her cat in the presence of this writer, Tyler Aas, NP and Dr Loleta Chance. Patient was visibly shaken and stated she had no recollection of doing anything to her cat. Patient was sobbing uncontrollably. Patient repeatedly said "this was my cat, I would never do anything to harm him."   CSW placed a CPS report regarding the above matter, see note in chart.   Reason for admission: This is the second psychiatric admission in this [facility] for this 35 year old Caucasian female with hx of mental illness, substance use disorder & other previous psychiatric admissions. She is being admitted to the [facility] under IVC this time around for complain of worsening paranoia & medication non-compliance. Her UDS was positive for Amphetamine & THC.   Principal Problem: Schizoaffective disorder (HCC)  Diagnosis: Principal Problem:   Schizoaffective disorder (HCC) Active Problems:   Attention deficit hyperactivity disorder (ADHD), predominantly inattentive type   Mood disorder (HCC)   Nicotine  addiction   Difficulty controlling anger   Amphetamine dependence (HCC)   Moderate cannabis use disorder (HCC)  Total Time spent with patient:  35 minutes  Past Psychiatric History: See H&P  Past Medical History:  Past Medical History:  Diagnosis Date   Anxiety    Bipolar 1 disorder (HCC)    Depression    Hypertension     Past Surgical History:  Procedure  Laterality Date   DILATION AND CURETTAGE OF UTERUS     TONSILLECTOMY     WISDOM TOOTH EXTRACTION     Family History:  Family History  Problem Relation Age of Onset   Depression Father    Anxiety disorder Father    ADD / ADHD Brother    Family Psychiatric  History: See H&P.   Social History:  Social History   Substance and Sexual Activity  Alcohol Use Yes   Comment: drinks- 3-4 beers monthly     Social History   Substance and Sexual Activity  Drug Use No    Social History   Socioeconomic History   Marital status: Single    Spouse name: Not on file   Number of children: Not on file   Years of education: Not on file   Highest education level: Not on file  Occupational History   Not on file  Tobacco Use   Smoking status: Former    Packs/day: 0.50    Years: 10.00    Pack years: 5.00    Types: Cigarettes    Quit date: 03/08/2014    Years since quitting: 7.6   Smokeless tobacco: Never  Vaping Use   Vaping Use: Every day   Substances: Nicotine  Substance and Sexual Activity   Alcohol use: Yes    Comment: drinks- 3-4 beers monthly   Drug use: No   Sexual activity: Yes    Partners: Male    Comment: number of sex partners in the last 12 months  1  Other Topics Concern   Not on file  Social History Narrative   Not on file   Social Determinants of Health   Financial Resource Strain: Not on file  Food Insecurity: Not on file  Transportation Needs: Not on file  Physical Activity: Not on file  Stress: Not on file  Social Connections: Not on file   Additional Social History:   Sleep: Good  Appetite:  Good  Current Medications: Current Facility-Administered Medications  Medication Dose Route Frequency Provider Last Rate Last Admin   acetaminophen (TYLENOL) tablet 650 mg  650 mg Oral Q6H PRN Novella Olive, NP       alum & mag hydroxide-simeth (MAALOX/MYLANTA) 200-200-20 MG/5ML suspension 30 mL  30 mL Oral Q4H PRN Novella Olive, NP       buPROPion  (WELLBUTRIN XL) 24 hr tablet 150 mg  150 mg Oral Daily Novella Olive, NP   150 mg at 10/11/21 7564   cariprazine (VRAYLAR) capsule 1.5 mg  1.5 mg Oral Daily Armandina Stammer I, NP   1.5 mg at 10/11/21 0829   hydrOXYzine (ATARAX/VISTARIL) tablet 25 mg  25 mg Oral TID PRN Novella Olive, NP   25 mg at 10/10/21 1629   risperiDONE (RISPERDAL M-TABS) disintegrating tablet 2 mg  2 mg Oral Q8H PRN Novella Olive, NP       And   LORazepam (ATIVAN) tablet 1 mg  1 mg Oral PRN Novella Olive, NP       And   ziprasidone (GEODON)  injection 20 mg  20 mg Intramuscular PRN Novella Olive, NP       magnesium hydroxide (MILK OF MAGNESIA) suspension 30 mL  30 mL Oral Daily PRN Novella Olive, NP       nicotine polacrilex (NICORETTE) gum 2 mg  2 mg Oral PRN Armandina Stammer I, NP   2 mg at 10/11/21 7829   traZODone (DESYREL) tablet 50 mg  50 mg Oral QHS PRN Novella Olive, NP   50 mg at 10/08/21 2043   Lab Results:  Results for orders placed or performed during the hospital encounter of 10/08/21 (from the past 48 hour(s))  Resp Panel by RT-PCR (Flu A&B, Covid) Nasopharyngeal Swab     Status: None   Collection Time: 10/10/21  4:21 AM   Specimen: Nasopharyngeal Swab; Nasopharyngeal(NP) swabs in vial transport medium  Result Value Ref Range   SARS Coronavirus 2 by RT PCR NEGATIVE NEGATIVE    Comment: (NOTE) SARS-CoV-2 target nucleic acids are NOT DETECTED.  The SARS-CoV-2 RNA is generally detectable in upper respiratory specimens during the acute phase of infection. The lowest concentration of SARS-CoV-2 viral copies this assay can detect is 138 copies/mL. A negative result does not preclude SARS-Cov-2 infection and should not be used as the sole basis for treatment or other patient management decisions. A negative result may occur with  improper specimen collection/handling, submission of specimen other than nasopharyngeal swab, presence of viral mutation(s) within the areas targeted by this assay, and inadequate  number of viral copies(<138 copies/mL). A negative result must be combined with clinical observations, patient history, and epidemiological information. The expected result is Negative.  Fact Sheet for Patients:  BloggerCourse.com  Fact Sheet for Healthcare Providers:  SeriousBroker.it  This test is no t yet approved or cleared by the [place] FDA and  has been authorized for detection and/or diagnosis of SARS-CoV-2 by FDA under an Emergency Use Authorization (EUA). This EUA will remain  in effect (meaning this test can be used) for the duration of the COVID-19 declaration under Section 564(b)(1) of the Act, 21 U.S.C.section 360bbb-3(b)(1), unless the authorization is terminated  or revoked sooner.       Influenza A by PCR NEGATIVE NEGATIVE   Influenza B by PCR NEGATIVE NEGATIVE    Comment: (NOTE) The Xpert Xpress SARS-CoV-2/FLU/RSV plus assay is intended as an aid in the diagnosis of influenza from Nasopharyngeal swab specimens and should not be used as a sole basis for treatment. Nasal washings and aspirates are unacceptable for Xpert Xpress SARS-CoV-2/FLU/RSV testing.  Fact Sheet for Patients: BloggerCourse.com  Fact Sheet for Healthcare Providers: SeriousBroker.it  This test is not yet approved or cleared by the [place] FDA and has been authorized for detection and/or diagnosis of SARS-CoV-2 by FDA under an Emergency Use Authorization (EUA). This EUA will remain in effect (meaning this test can be used) for the duration of the COVID-19 declaration under Section 564(b)(1) of the Act, 21 U.S.C. section 360bbb-3(b)(1), unless the authorization is terminated or revoked.  Performed at [facility], [address], [place], [place] [address]    Blood Alcohol level:  Lab Results  Component Value Date   [facility] <10 10/05/2021   ETH <10 09/04/2021     Metabolic Disorder Labs: Lab Results  Component Value Date   HGBA1C 4.9 10/09/2021   MPG 93.93 10/09/2021   MPG 99.67 05/28/2020   No results found for: PROLACTIN Lab Results  Component Value Date   CHOL 148 10/09/2021  TRIG 68 10/09/2021   HDL 41 10/09/2021   CHOLHDL 3.6 10/09/2021   VLDL 14 10/09/2021   LDLCALC 93 10/09/2021   LDLCALC 89 05/28/2020   Physical Findings: AIMS: Facial and Oral Movements Muscles of Facial Expression: None, normal Lips and Perioral Area: None, normal Jaw: None, normal Tongue: None, normal,Extremity Movements Upper (arms, wrists, hands, fingers): None, normal Lower (legs, knees, ankles, toes): None, normal, Trunk Movements Neck, shoulders, hips: None, normal, Overall Severity Severity of abnormal movements (highest score from questions above): None, normal Incapacitation due to abnormal movements: None, normal Patient's awareness of abnormal movements (rate only patient's report): No Awareness, Dental Status Current problems with teeth and/or dentures?: No Does patient usually wear dentures?: No  CIWA:    COWS:     Musculoskeletal: Strength & Muscle Tone: within normal limits Gait & Station: normal Patient leans: N/A  Psychiatric Specialty Exam:  Presentation  General Appearance: Appropriate for Environment; Casual  Eye Contact:Good  Speech:Clear and Coherent; Normal Rate  Speech Volume:Normal  Handedness:Right  Mood and Affect  Mood: Euthymic this morning, dysphoric in the afternoon after receiving some distressing news  Affect:Congruent  Thought Process  Thought Processes:Coherent; Goal Directed  Descriptions of Associations:Intact  Orientation:Full (Time, Place and Person)  Thought Content:Logical  History of Schizophrenia/Schizoaffective disorder:Yes  Duration of Psychotic Symptoms:Greater than six months  Hallucinations:Patient denies  Ideas of Reference:Other (comment) (patient denies  hallucinations)  Suicidal Thoughts:patient denies  Homicidal Thoughts:patient denies  Sensorium  Memory:Remote Fair; Immediate Good; Recent Fair  Judgment:Fair  Insight:Fair  Executive Functions  Concentration:Good  Attention Span:Good  Recall:Good  Fund of Knowledge:Good  Language:Good  Psychomotor Activity  Psychomotor Activity:Normal  Assets  Assets:Communication Skills; Desire for Improvement; Housing; Physical Health; Resilience; Social Support; Financial Resources/Insurance  Sleep  Sleep:Good per patient, sleep hours not recorded in Epic today  Physical Exam: Physical Exam Vitals and nursing note reviewed.  Constitutional:      Appearance: Normal appearance.  HENT:     Head: Normocephalic and atraumatic.     Mouth/Throat:     Pharynx: Oropharynx is clear.  Cardiovascular:     Rate and Rhythm: Normal rate.     Pulses: Normal pulses.  Pulmonary:     Effort: Pulmonary effort is normal.  Genitourinary:    Comments: Deferred Musculoskeletal:        General: Normal range of motion.     Cervical back: Normal range of motion.  Neurological:     General: No focal deficit present.     Mental Status: She is alert and oriented to person, place, and time.   Review of Systems  Constitutional:  Negative for chills, diaphoresis and fever.  HENT:  Negative for congestion and sore throat.   Eyes:  Negative for blurred vision.  Respiratory:  Negative for cough, shortness of breath and wheezing.   Cardiovascular:  Negative for chest pain and palpitations.  Gastrointestinal:  Negative for abdominal pain, constipation, diarrhea, heartburn, nausea and vomiting.  Genitourinary:  Negative for dysuria.  Musculoskeletal:  Negative for joint pain and myalgias.  Neurological:  Negative for dizziness, tingling, tremors, sensory change, speech change, focal weakness, seizures, loss of consciousness, weakness and headaches.  Endo/Heme/Allergies:        Allergies: NKDA   Psychiatric/Behavioral:  Positive for substance abuse (UDS (+) for methamphetamine). Negative for depression, hallucinations, memory loss and suicidal ideas. The patient is not nervous/anxious and does not have insomnia.   Blood pressure 123/85, pulse 84, temperature (!) 97.5 F (36.4 C), temperature source  Oral, resp. rate 16, height 5\' 7"  (1.702 m), weight 79.8 kg, SpO2 98 %. Body mass index is 27.57 kg/m.  Treatment Plan Summary: Daily contact with patient to assess and evaluate symptoms and progress in treatment and Medication management.   Continue inpatient hospitalization.  Will continue today 10/11/2021 plan as below except where it is noted.   Diagnosis:  Bipolar 1 disorder.  Generalized anxiety disorder.   Plan.  Bipolar disorder. Continue Vraylar 1.5 mg po daily.  Continue Wellbutrin XL 150 mg po daily.   Anxiety.  Continue Vistaril 25 mg po tid prn.   Insomnia. Continue Trazodone 50 mg po Q hs prn.   Continue other prn medications for other medical complaints.   Agitation protocols.  Continue agitation protocol regimen as recommended prn.                -- Patient's case to be discussed in multi-disciplinary team meetings             -- Observation Level : q15 minute checks             -- Vital signs:  q12 hours             -- Precautions: suicide, elopement, and assault   Discharge Planning:              -- Social work and case management to assist with discharge planning and identification of hospital follow-up needs prior to discharge             -- Estimated LOS: 5-7 days             -- Discharge Concerns: Need to establish a safety plan; Medication compliance/effectiveness.               10/13/2021, NP 10/11/2021, 4:23 PM

## 2021-10-12 DIAGNOSIS — F250 Schizoaffective disorder, bipolar type: Secondary | ICD-10-CM | POA: Diagnosis not present

## 2021-10-12 LAB — HEPATIC FUNCTION PANEL
ALT: 25 U/L (ref 0–44)
AST: 21 U/L (ref 15–41)
Albumin: 4.3 g/dL (ref 3.5–5.0)
Alkaline Phosphatase: 48 U/L (ref 38–126)
Bilirubin, Direct: 0.1 mg/dL (ref 0.0–0.2)
Indirect Bilirubin: 0.4 mg/dL (ref 0.3–0.9)
Total Bilirubin: 0.5 mg/dL (ref 0.3–1.2)
Total Protein: 7.1 g/dL (ref 6.5–8.1)

## 2021-10-12 LAB — URINALYSIS, COMPLETE (UACMP) WITH MICROSCOPIC
Bacteria, UA: NONE SEEN
Bilirubin Urine: NEGATIVE
Glucose, UA: NEGATIVE mg/dL
Hgb urine dipstick: NEGATIVE
Ketones, ur: NEGATIVE mg/dL
Nitrite: NEGATIVE
Protein, ur: NEGATIVE mg/dL
Specific Gravity, Urine: 1.019 (ref 1.005–1.030)
pH: 6 (ref 5.0–8.0)

## 2021-10-12 NOTE — Progress Notes (Signed)
[facility] MD Progress Note  10/12/2021 1:03 PM Ana Best  MRN:  [id number]  Subjective: Patient stated "I feel better today than yesterday. I am trying to process what I was told yesterday and just now my Mom told me she found my little kitten a new home."   Evaluation on the unit today, 11/22: Patient is seen, chart reviewed and case discussed with the treatment team. She is alert & oriented and maintains good eye contact. She is processing the news she received yesterday about her cat. She found out this morning that her mother found a home for the new kitten she had just gotten. She is still visibly upset and crying about her cats but is able to calm herself and have a rational conversation. She is eating and sleeping well. Record reflects she slept 8.5 hours. She is casually dressed with good hygiene. She isolated to her room most of yesterday but today she is visible on the unit, attending group sessions. We discussed substance abuse rehab or SAIOP, patient stated she does not want or feels she needs rehab. She is interested in PHP/IOP for her Bipolar II. Today, she denies any symptoms of depression or anxiety. She stated that she feels good despite the events that led up to her being hospitalized. She denies SI/HI, AVH, delusional thoughts or paranoia. She does not appear to be responding to any internal stimuli. She is taking her medications and denies any medication side effects. Her UA on admission showed many bacteria, protein 30, ketones 80, and Turbid color. We will order a repeat UA and liver function panel, her AST was mildly elevated at 45. We will continue to monitor for medication effectiveness and safety.   10/11/2021: Collateral from patient's mother [name] [phone]. [name] was contacted by this provider, Starleen Blue, NP and Dr Loleta Chance today to discuss her concerns for patient's return home. [name] stated, "I am concerned that my daughter is doing drugs. She  lives with her 44 year old son in a house I own but I pay all the bills." [name] stated she found the patient's cat dead in the bathtub with a hair tie around its neck and wrapped in a towel. This happened when the patient was psychotic. [name] wanted this information withheld from the patient until she could be told in therapy. Dr Loleta Chance explained that this was not the best course of action, due to the patient has the right to be told immediately as this was her property. Also for safety purposes the patient should be told while in a safe environment in order to monitor her reaction and safety when told.  [name] stated she has her 46 year old grandson with her most of the time as her daughter has struggled with mental illness and drug use for some time. [name] was rather insistent she know the results of patient's toxicology report. She stated this would alter how she handled paying the bills and she would not give the patient money. The process of requesting medical records was explained. [name] is not her legal guardian. [name] stated she only wants to help her daughter.  Spoke with patient at 1645 regarding an incident that happened on the 500 hall early this morning. Patient was seen entering another patient's room. MHT ran down the hall to see what was happening. Patient stated she went into the patient's room to wake him up. There was no inappropriate sexual conduct witnessed. Patient was moved to  the 3000 hall. Patient was reminded of the unit rules and there is no entering other patients rooms. Patient verbalized understanding.   Addendum to patients daily evaluation note on 10/11/2021:  Patient was presented with the information about her cat in the presence of this writer, Tamela Oddi, NP and Dr Berdine Addison. Patient was visibly shaken and stated she had no recollection of doing anything to her cat. Patient was sobbing uncontrollably. Patient repeatedly said  "this was my cat, I would never do anything to harm him."   10/11/2021: CSW placed a CPS report regarding the above matter, see note in chart.   Reason for admission: This is the second psychiatric admission in this [facility] for this 35 year old Caucasian female with hx of mental illness, substance use disorder & other previous psychiatric admissions. She is being admitted to the [facility] under IVC this time around for complain of worsening paranoia & medication non-compliance. Her UDS was positive for Amphetamine & THC.   Principal Problem: Bipolar II disorder (HCC)  Diagnosis: Principal Problem:   Bipolar II disorder (HCC) Active Problems:   Nicotine addiction   Difficulty controlling anger   Psychoactive substance-induced psychosis ([place])   Moderate cannabis use disorder ([place])  Total Time spent with patient:  20 minutes  Past Psychiatric History: See H&P  Past Medical History:  Past Medical History:  Diagnosis Date   Anxiety    Bipolar 1 disorder ([place])    Depression    Hypertension     Past Surgical History:  Procedure Laterality Date   DILATION AND CURETTAGE OF UTERUS     TONSILLECTOMY     WISDOM TOOTH EXTRACTION     Family History:  Family History  Problem Relation Age of Onset   Depression Father    Anxiety disorder Father    ADD / ADHD Brother    Family Psychiatric  History: See H&P.   Social History:  Social History   Substance and Sexual Activity  Alcohol Use Yes   Comment: drinks- 3-4 beers monthly     Social History   Substance and Sexual Activity  Drug Use No    Social History   Socioeconomic History   Marital status: Single    Spouse name: Not on file   Number of children: Not on file   Years of education: Not on file   Highest education level: Not on file  Occupational History   Not on file  Tobacco Use   Smoking status: Former    Packs/day: 0.50    Years: 10.00    Pack years: 5.00    Types: Cigarettes    Quit date: 03/08/2014    Years since  quitting: 7.6   Smokeless tobacco: Never  Vaping Use   Vaping Use: Every day   Substances: Nicotine  Substance and Sexual Activity   Alcohol use: Yes    Comment: drinks- 3-4 beers monthly   Drug use: No   Sexual activity: Yes    Partners: Male    Comment: number of sex partners in the last 12 months  1  Other Topics Concern   Not on file  Social History Narrative   Not on file   Social Determinants of Health   Financial Resource Strain: Not on file  Food Insecurity: Not on file  Transportation Needs: Not on file  Physical Activity: Not on file  Stress: Not on file  Social Connections: Not on file   Additional Social History:   Sleep: Good  Appetite:  Good  Current Medications: Current Facility-Administered Medications  Medication Dose Route Frequency Provider Last Rate Last Admin   acetaminophen (TYLENOL) tablet 650 mg  650 mg Oral Q6H PRN Chalmers Guest, NP       alum & mag hydroxide-simeth (MAALOX/MYLANTA) 200-200-20 MG/5ML suspension 30 mL  30 mL Oral Q4H PRN Chalmers Guest, NP       buPROPion (WELLBUTRIN XL) 24 hr tablet 150 mg  150 mg Oral Daily Chalmers Guest, NP   150 mg at 10/12/21 0744   cariprazine (VRAYLAR) capsule 1.5 mg  1.5 mg Oral Daily Lindell Spar I, NP   1.5 mg at 10/12/21 0744   hydrOXYzine (ATARAX/VISTARIL) tablet 25 mg  25 mg Oral TID PRN Chalmers Guest, NP   25 mg at 10/11/21 2136   risperiDONE (RISPERDAL M-TABS) disintegrating tablet 2 mg  2 mg Oral Q8H PRN Chalmers Guest, NP       And   LORazepam (ATIVAN) tablet 1 mg  1 mg Oral PRN Chalmers Guest, NP       And   ziprasidone (GEODON) injection 20 mg  20 mg Intramuscular PRN Chalmers Guest, NP       magnesium hydroxide (MILK OF MAGNESIA) suspension 30 mL  30 mL Oral Daily PRN Chalmers Guest, NP       nicotine polacrilex (NICORETTE) gum 2 mg  2 mg Oral PRN Lindell Spar I, NP   2 mg at 10/11/21 0829   traZODone (DESYREL) tablet 50 mg  50 mg Oral QHS PRN Chalmers Guest, NP   50 mg at 10/11/21 2136    Lab Results:  No results found for this or any previous visit (from the past 48 hour(s)).  Blood Alcohol level:  Lab Results  Component Value Date   ETH <10 10/05/2021   ETH <10 [id number]    Metabolic Disorder Labs: Lab Results  Component Value Date   HGBA1C 4.9 10/09/2021   MPG 93.93 10/09/2021   MPG 99.67 05/28/2020   No results found for: PROLACTIN Lab Results  Component Value Date   CHOL 148 10/09/2021   TRIG 68 10/09/2021   HDL 41 10/09/2021   CHOLHDL 3.6 10/09/2021   VLDL 14 10/09/2021   LDLCALC 93 10/09/2021   LDLCALC 89 05/28/2020   Physical Findings: AIMS: Facial and Oral Movements Muscles of Facial Expression: None, normal Lips and Perioral Area: None, normal Jaw: None, normal Tongue: None, normal,Extremity Movements Upper (arms, wrists, hands, fingers): None, normal Lower (legs, knees, ankles, toes): None, normal, Trunk Movements Neck, shoulders, hips: None, normal, Overall Severity Severity of abnormal movements (highest score from questions above): None, normal Incapacitation due to abnormal movements: None, normal Patient's awareness of abnormal movements (rate only patient's report): No Awareness, Dental Status Current problems with teeth and/or dentures?: No Does patient usually wear dentures?: No  CIWA:    COWS:     Musculoskeletal: Strength & Muscle Tone: within normal limits Gait & Station: normal Patient leans: N/A  Psychiatric Specialty Exam:  Presentation  General Appearance: Appropriate for Environment; Casual  Eye Contact:Good  Speech:Clear and Coherent; Normal Rate  Speech Volume:Normal  Handedness:Right  Mood and Affect  Mood: Euthymic but tearful over the news about her cats.   Affect:Congruent  Thought Process  Thought Processes:Coherent; Goal Directed  Descriptions of Associations:Intact  Orientation:Full (Time, Place and Person)  Thought Content:Logical  History of Schizophrenia/Schizoaffective  disorder:Yes  Duration of Psychotic Symptoms:Greater than six months  Hallucinations:Patient denies  Ideas of Reference:Other (comment) (  patient denies hallucinations)  Suicidal Thoughts:patient denies  Homicidal Thoughts:patient denies  Sensorium  Memory:Remote Fair; Immediate Good; Recent Fair  Judgment:Fair  Insight:Fair  Executive Functions  Concentration:Good  Attention Span:Good  [place]  Language:Good  Psychomotor Activity  Psychomotor Activity:Normal  Assets  Assets:Communication Skills; Desire for Improvement; Housing; Physical Health; Resilience; Social Support; Financial Resources/Insurance  Sleep  Sleep:Good per patient, sleep hours not recorded in Epic today  Physical Exam: Physical Exam Vitals and nursing note reviewed.  Constitutional:      Appearance: Normal appearance.  HENT:     Head: Normocephalic and atraumatic.     Mouth/Throat:     Pharynx: Oropharynx is clear.  Cardiovascular:     Rate and Rhythm: Normal rate.     Pulses: Normal pulses.  Pulmonary:     Effort: Pulmonary effort is normal.  Genitourinary:    Comments: Deferred Musculoskeletal:        General: Normal range of motion.     Cervical back: Normal range of motion.  Neurological:     General: No focal deficit present.     Mental Status: She is alert and oriented to person, place, and time.   Review of Systems  Constitutional:  Negative for chills, diaphoresis and fever.  HENT:  Negative for congestion and sore throat.   Eyes:  Negative for blurred vision.  Respiratory:  Negative for cough, shortness of breath and wheezing.   Cardiovascular:  Negative for chest pain and palpitations.  Gastrointestinal:  Negative for abdominal pain, constipation, diarrhea, heartburn, nausea and vomiting.  Genitourinary:  Negative for dysuria.  Musculoskeletal:  Negative for joint pain and myalgias.  Neurological:  Negative for dizziness, tingling, tremors,  sensory change, speech change, focal weakness, seizures, loss of consciousness, weakness and headaches.  Endo/Heme/Allergies:        Allergies: NKDA  Psychiatric/Behavioral:  Positive for substance abuse (UDS (+) for methamphetamine). Negative for depression, hallucinations, memory loss and suicidal ideas. The patient is not nervous/anxious and does not have insomnia.    Blood pressure 117/78, pulse 83, temperature 98.3 F (36.8 C), temperature source Oral, resp. rate 16, height 5\' 7"  (1.702 m), weight 79.8 kg, SpO2 99 %. Body mass index is 27.57 kg/m.  Treatment Plan Summary: Daily contact with patient to assess and evaluate symptoms and progress in treatment and Medication management.   Continue inpatient hospitalization.  Will continue today 10/12/2021 plan as below except where it is noted.   Diagnosis:  Bipolar II disorder.  Generalized anxiety disorder.   Plan.  Bipolar II disorder. Continue Vraylar 1.5 mg po daily.  Continue Wellbutrin XL 150 mg po daily.   Anxiety.  Continue Vistaril 25 mg po tid prn.   Insomnia. Continue Trazodone 50 mg po Q hs prn.   Continue other prn medications for other medical complaints.   Agitation protocols.  Continue agitation protocol regimen as recommended prn.                -- Patient's case to be discussed in multi-disciplinary team meetings             -- Observation Level : q15 minute checks             -- Vital signs:  q12 hours             -- Precautions: suicide, elopement, and assault  Labs ordered: Repeat UA and Hepatic function panel.    Discharge Planning:              --  Social work and case management to assist with discharge planning and identification of hospital follow-up needs prior to discharge. Patient is interested in PHP/IOP programs.              -- Estimated LOS: 5-7 days             -- Discharge Concerns: Need to establish a safety plan; Medication compliance/effectiveness.               Ethelene Hal, NP 10/12/2021, 1:05 PM

## 2021-10-12 NOTE — Progress Notes (Addendum)
Dar Note: Patient presents with calm affect and mood.  Medications given as prescribed.  Denies suicidal thoughts, auditory and visual hallucinations.  Rates depression and anxiety at 1/10.  Patient attended group and participated.  Routine safety checks maintained for safety.  Stated goals for today is "processing everything."  Patient is safe on and off the unit.

## 2021-10-12 NOTE — Progress Notes (Signed)
Psychoeducational Group Note  Date:  10/12/2021 Time:  2055  Group Topic/Focus:  Wrap-Up Group:   The focus of this group is to help patients review their daily goal of treatment and discuss progress on daily workbooks.  Participation Level: Did Not Attend  Participation Quality:  Not Applicable  Affect:  Not Applicable  Cognitive:  Not Applicable  Insight:  Not Applicable  Engagement in Group: Not Applicable  Additional Comments:  The patient did not attend group this evening.   Hazle Coca S 10/12/2021, 8:55 PM

## 2021-10-12 NOTE — Progress Notes (Signed)
Pt was witnessed and caught going into a female patient's room. Pt was redirected and confronted immediately and when writer asked what was her rationale for her actions, she stated "because I am bored and wanted to!" Pt then laughed and proceeded to go to her room. Pt has been very flirtatious with this particular female patient all morning long and was redirected many times when it came to touching, laying on the floor in the dayroom and in the hallway with one another. Pt brushes it off like it is not a big deal. Pt was reminded of the unit rules and made it known that type of behavior will not be tolerated on the unit at all. Pt was moved off the hall in order to diffuse any more potential issues.
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
## 2021-10-13 NOTE — BHH Group Notes (Signed)
PT was informed but did not attend group. ?

## 2021-10-13 NOTE — Progress Notes (Signed)
   On assessment, patient is asleep lying in the prone position.  The rise and fall of patient's chest is noted.  Patient does not appear to be in any distress or pain.  Patient is covered with blanket and resting.    No medications due at this time.  Patient is safe on unit with Q 15 minute safety checks.

## 2021-10-13 NOTE — BHH Group Notes (Signed)
Adult Psychoeducational Group Note  Date:  10/13/2021 Time:  4:44 PM  Group Topic/Focus:  Relaxation  Participation Level:  Did Not Attend    Donell Beers 10/13/2021, 4:44 PM

## 2021-10-13 NOTE — Group Note (Signed)
LCSW Group Therapy Note   Group Date: 10/13/2021 Start Time: 1300 End Time: 1400  Type of Therapy and Topic:  Group Therapy: Worry    Participation Level: Did Not Attend   Description of Group:   In this group, patients learned how to define worry. Patients were asked to identify a time they felt anxiety or something that triggers their anxiety. Patients engaged in a matching interactive activity matching bugs, with each match facts about worry was shared. Patients and CSW engaged in discussion surrounding each fact / definition shared. Patients were then asked to explore positive coping mechanisms for worry and discussed things like unrealistic worry, things out of our control and times that they worried about something but actually ended up enjoying it (I.e. Learning to ride a bike). Patients discussed several new ways to handle worry such as music, journaling, thinking about positive endings instead of negative, drawing, happy place, relaxation skills (deep breathing, progressive muscle relaxation and meditation) and engaging in a hobby. CSW asked patients to commit to trying a positive coping skill when faced with worry in the future.   Therapeutic Goals: Patients will recall a time they felt worried or identify something they worry about often. Patients will learn how to define worry.  Patients will learn that some worry cannot be erased, as some things are out of out control and at times our worries are not likely to happen.  Patients will be asked to do some self-reflection with their own worry (throughout the matching activity). Patients will be asked to practice impulse control with the matching game and imagery with the happy place during this group.   Patients were asked to share ways they can cope with anxiety.       Summary of Patient Progress:  Did Not Attend     Therapeutic Modalities:   Cognitive Behavioral Therapy Motivational Interviewing  Brief Therapy   Chrys Racer 10/13/2021  1:25 PM

## 2021-10-13 NOTE — Plan of Care (Signed)
  Problem: Coping: Goal: Ability to identify and develop effective coping behavior will improve Outcome: Not Progressing   Problem: Self-Concept: Goal: Level of anxiety will decrease Outcome: Not Progressing   Problem: Education: Goal: Knowledge of the prescribed therapeutic regimen will improve Outcome: Not Progressing

## 2021-10-13 NOTE — BHH Group Notes (Signed)
Adult Psychoeducational Group Note  Date:  10/13/2021 Time:  8:45 AM  Group Topic/Focus:  Goals Group:   The focus of this group is to help patients establish daily goals to achieve during treatment and discuss how the patient can incorporate goal setting into their daily lives to aide in recovery.  Participation Level:  Did Not Attend    Donell Beers 10/13/2021, 8:45 AM

## 2021-10-13 NOTE — Progress Notes (Signed)
    Patient is assessed by Clinical research associate in patient room.  Patient remains asleep.  MHT reports patient awakened for only a short while and nourishment and fluids were offered, but patient went back to sleep.  Patient remains safe on unit with Q 15 minute safety checks.

## 2021-10-13 NOTE — Progress Notes (Addendum)
DL  Patient denied SI and HI, contracts for safety.  Denied A/V hallucinations.  Denied pain. A:  Medications administered per MD orders.   Emotional support and encouragement given patient. R:  Safety maintained with 15 minute checks.  Nurse discussed with patient that it is not appropriate to pass notes to other patients on other hallways.

## 2021-10-13 NOTE — Progress Notes (Addendum)
  Patient presents with no complaints.  Patient states she rested well and believes its because of her getting upset yesterday.  Administered PRN Nicotene gum per [facility] per pt request.  Patient denies SI/HI and verbally contracts for safety.  Patient denies AVH.    10/12/21 2100  Psych Admission Type (Psych Patients Only)  Admission Status Involuntary  Psychosocial Assessment  Patient Complaints None  Eye Contact Fair  Facial Expression Animated  Affect Appropriate to circumstance  Speech Logical/coherent  Interaction Assertive  Motor Activity Other (Comment) (steady)  Appearance/Hygiene Unremarkable  Behavior Characteristics Cooperative  Mood Anxious;Pleasant  Thought Process  Coherency Concrete thinking  Content WDL  Delusions None reported or observed  Perception WDL  Hallucination None reported or observed  Judgment Impaired  Confusion None  Danger to Self  Current suicidal ideation? Denies  Danger to Others  Danger to Others None reported or observed

## 2021-10-13 NOTE — Progress Notes (Signed)
   Patient has changed positions in the bed and is now lying with head at the foot of the bed on her side.  Patient exhibits no distress.  Patient remains asleep.  Patient is safe on unit with Q 15 minute safety checks.

## 2021-10-13 NOTE — Plan of Care (Signed)
Nurse discussed coping skills with patient.  

## 2021-10-13 NOTE — BHH Counselor (Signed)
CSW met CPS social worker in [facility] lobby. CPS social worker, Olin Hauser [phone] met with patient and created a safety plan with pt in regards to her sons care.   Darletta Moll MSW, LCSW Clincal Social Worker  [facility]

## 2021-10-14 NOTE — Progress Notes (Signed)
D:  Patient denied SI and HI, contracts for safety.  Denied A/V hallucinations.  Denied pain. A:  Medications administered per MD orders  Emotion support and encouragement given patient. R:  Safety maintained with 15 minute checks.

## 2021-10-14 NOTE — Plan of Care (Signed)
Nurse discussed anxiety, depression and coping skills with patient.  

## 2021-10-14 NOTE — Progress Notes (Signed)
[facility] MD Progress Note  10/14/2021 1:15 PM Ana Best  MRN:  [id number]  Subjective: Ana Best reports, "I'm over it now. I have been here way too long. I need to go home. I miss my son. The doctor did tell me few minutes ago that I might be going home tomorrow or may be Saturday. I'm not depressed or anxious, just bored. I'm doing well on all my medicines, no side effects".  Daily notes (10/13/21): Ana Best is seen in her room, chart reviewed, the chart findings discussed with the treatment team. She presents alert & oriented & aware of situation. She is visible on the unit, attending group sessions. She is making a good eye contact. Her affect is bright. She started this follow-up care evaluation as stated on the subjective data above. She denies any symptoms of depression or anxiety. Staff has reported during team meeting this morning that patient was observed passing a note to a female patient on another unit. This female patient was the same patient that Ana Best went into his room that led to her being moved to the 300-hall. Patient did not deny receiving or accepting a note from this female patient. She says she did not know that passing notes to one another is not allowed in this hospital. She also states that it is not a big deal, rather a fun thing to do to pass time here. She did acknowledge that this is not allowed & not tolerated. She says she has learned her lesson again. Ana Best seem to be responding well to her treatment regimen, however, her impulsive behaviors remain to show improvement. She has been moved out of the 500-hall 2 days ago to the 300-hall due to impulsive behaviors as this recent one. She is being monitored closely while on this unit & there have not been any reports of behavioral problems other than the impulsive behaviors. She is taking & tolerating her treatment regimen. She denies any side effects. She denies any SIHI, AVH, delusional thoughts or paranoia. She does not appear to  be responding to any internal stimuli. There are no changes made on this current plan of care. We will continue as already in progress.  10/11/2021: CSW placed a CPS report regarding the above matter, see note in chart.   Reason for admission: This is the second psychiatric admission in this [facility] for this 35 year old Caucasian female with hx of mental illness, substance use disorder & other previous psychiatric admissions. She is being admitted to the [facility] under IVC this time around for complain of worsening paranoia & medication non-compliance. Her UDS was positive for Amphetamine & THC.   Principal Problem: Bipolar II disorder (HCC)  Diagnosis: Principal Problem:   Bipolar II disorder (HCC) Active Problems:   Nicotine addiction   Difficulty controlling anger   Psychoactive substance-induced psychosis ([place])   Moderate cannabis use disorder ([place])  Total Time spent with patient:  20 minutes  Past Psychiatric History: See H&P  Past Medical History:  Past Medical History:  Diagnosis Date   Anxiety    Bipolar 1 disorder ([place])    Depression    Hypertension     Past Surgical History:  Procedure Laterality Date   DILATION AND CURETTAGE OF UTERUS     TONSILLECTOMY     WISDOM TOOTH EXTRACTION     Family History:  Family History  Problem Relation Age of Onset   Depression Father    Anxiety disorder Father    ADD / ADHD Brother  Family Psychiatric  History: See H&P.   Social History:  Social History   Substance and Sexual Activity  Alcohol Use Yes   Comment: drinks- 3-4 beers monthly     Social History   Substance and Sexual Activity  Drug Use No    Social History   Socioeconomic History   Marital status: Single    Spouse name: Not on file   Number of children: Not on file   Years of education: Not on file   Highest education level: Not on file  Occupational History   Not on file  Tobacco Use   Smoking status: Former    Packs/day: 0.50    Years: 10.00    Pack  years: 5.00    Types: Cigarettes    Quit date: 03/08/2014    Years since quitting: 7.6   Smokeless tobacco: Never  Vaping Use   Vaping Use: Every day   Substances: Nicotine  Substance and Sexual Activity   Alcohol use: Yes    Comment: drinks- 3-4 beers monthly   Drug use: No   Sexual activity: Yes    Partners: Male    Comment: number of sex partners in the last 12 months  1  Other Topics Concern   Not on file  Social History Narrative   Not on file   Social Determinants of Health   Financial Resource Strain: Not on file  Food Insecurity: Not on file  Transportation Needs: Not on file  Physical Activity: Not on file  Stress: Not on file  Social Connections: Not on file   Additional Social History:   Sleep: Good  Appetite:  Good  Current Medications: Current Facility-Administered Medications  Medication Dose Route Frequency Provider Last Rate Last Admin   acetaminophen (TYLENOL) tablet 650 mg  650 mg Oral Q6H PRN Novella Olive, NP       alum & mag hydroxide-simeth (MAALOX/MYLANTA) 200-200-20 MG/5ML suspension 30 mL  30 mL Oral Q4H PRN Novella Olive, NP       buPROPion (WELLBUTRIN XL) 24 hr tablet 150 mg  150 mg Oral Daily Novella Olive, NP   150 mg at 10/14/21 0900   cariprazine (VRAYLAR) capsule 1.5 mg  1.5 mg Oral Daily Armandina Stammer I, NP   1.5 mg at 10/14/21 0900   hydrOXYzine (ATARAX/VISTARIL) tablet 25 mg  25 mg Oral TID PRN Novella Olive, NP   25 mg at 10/11/21 2136   risperiDONE (RISPERDAL M-TABS) disintegrating tablet 2 mg  2 mg Oral Q8H PRN Novella Olive, NP       And   LORazepam (ATIVAN) tablet 1 mg  1 mg Oral PRN Novella Olive, NP       And   ziprasidone (GEODON) injection 20 mg  20 mg Intramuscular PRN Novella Olive, NP       magnesium hydroxide (MILK OF MAGNESIA) suspension 30 mL  30 mL Oral Daily PRN Novella Olive, NP       nicotine polacrilex (NICORETTE) gum 2 mg  2 mg Oral PRN Armandina Stammer I, NP   2 mg at 10/14/21 1055   traZODone (DESYREL)  tablet 50 mg  50 mg Oral QHS PRN Novella Olive, NP   50 mg at 10/11/21 2136   Lab Results:  Results for orders placed or performed during the hospital encounter of 10/08/21 (from the past 48 hour(s))  Hepatic function panel     Status: None   Collection Time: 10/12/21  6:28  PM  Result Value Ref Range   Total Protein 7.1 6.5 - 8.1 g/dL   Albumin 4.3 3.5 - 5.0 g/dL   AST 21 15 - 41 U/L   ALT 25 0 - 44 U/L   Alkaline Phosphatase 48 38 - 126 U/L   Total Bilirubin 0.5 0.3 - 1.2 mg/dL   Bilirubin, Direct 0.1 0.0 - 0.2 mg/dL   Indirect Bilirubin 0.4 0.3 - 0.9 mg/dL    Comment: Performed at [facility], [address], [place], [place] [address]   Blood Alcohol level:  Lab Results  Component Value Date   [facility] <10 10/05/2021   ETH <10 09/04/2021   Metabolic Disorder Labs: Lab Results  Component Value Date   HGBA1C 4.9 10/09/2021   MPG 93.93 10/09/2021   MPG 99.67 05/28/2020   No results found for: PROLACTIN Lab Results  Component Value Date   CHOL 148 10/09/2021   TRIG 68 10/09/2021   HDL 41 10/09/2021   CHOLHDL 3.6 10/09/2021   VLDL 14 10/09/2021   LDLCALC 93 10/09/2021   LDLCALC 89 05/28/2020   Physical Findings: AIMS: Facial and Oral Movements Muscles of Facial Expression: None, normal Lips and Perioral Area: None, normal Jaw: None, normal Tongue: None, normal,Extremity Movements Upper (arms, wrists, hands, fingers): None, normal Lower (legs, knees, ankles, toes): None, normal, Trunk Movements Neck, shoulders, hips: None, normal, Overall Severity Severity of abnormal movements (highest score from questions above): None, normal Incapacitation due to abnormal movements: None, normal Patient's awareness of abnormal movements (rate only patient's report): No Awareness, Dental Status Current problems with teeth and/or dentures?: No Does patient usually wear dentures?: No  CIWA:    COWS:     Musculoskeletal: Strength & Muscle Tone: within normal  limits Gait & Station: normal Patient leans: N/A  Psychiatric Specialty Exam:  Presentation  General Appearance: Appropriate for Environment; Casual  Eye Contact:Good  Speech:Clear and Coherent; Normal Rate  Speech Volume:Normal  Handedness:Right  Mood and Affect  Mood: Euthymic but tearful over the news about her cats.   Affect:Congruent  Thought Process  Thought Processes:Coherent; Goal Directed  Descriptions of Associations:Intact  Orientation:Full (Time, Place and Person)  Thought Content:Logical  History of Schizophrenia/Schizoaffective disorder:Yes  Duration of Psychotic Symptoms:Greater than six months  Hallucinations:Patient denies  Ideas of Reference:Other (comment) (patient denies hallucinations)  Suicidal Thoughts:patient denies  Homicidal Thoughts:patient denies  Sensorium  Memory:Remote Fair; Immediate Good; Recent Fair  Judgment:Fair  Insight:Fair  Executive Functions  Concentration:Good  Attention Span:Good  Recall:Good  Fund of Knowledge:Good  Language:Good  Psychomotor Activity  Psychomotor Activity:Normal  Assets  Assets:Communication Skills; Desire for Improvement; Housing; Physical Health; Resilience; Social Support; Financial Resources/Insurance  Sleep  Sleep:Good per patient, sleep hours not recorded in Epic today  Physical Exam: Physical Exam Vitals and nursing note reviewed.  Constitutional:      Appearance: Normal appearance.  HENT:     Head: Normocephalic and atraumatic.     Mouth/Throat:     Pharynx: Oropharynx is clear.  Cardiovascular:     Rate and Rhythm: Normal rate.     Pulses: Normal pulses.  Pulmonary:     Effort: Pulmonary effort is normal.  Genitourinary:    Comments: Deferred Musculoskeletal:        General: Normal range of motion.     Cervical back: Normal range of motion.  Neurological:     General: No focal deficit present.     Mental Status: She is alert and oriented to person, place,  and  time.   Review of Systems  Constitutional:  Negative for chills, diaphoresis and fever.  HENT:  Negative for congestion and sore throat.   Eyes:  Negative for blurred vision.  Respiratory:  Negative for cough, shortness of breath and wheezing.   Cardiovascular:  Negative for chest pain and palpitations.  Gastrointestinal:  Negative for abdominal pain, constipation, diarrhea, heartburn, nausea and vomiting.  Genitourinary:  Negative for dysuria.  Musculoskeletal:  Negative for joint pain and myalgias.  Neurological:  Negative for dizziness, tingling, tremors, sensory change, speech change, focal weakness, seizures, loss of consciousness, weakness and headaches.  Endo/Heme/Allergies:        Allergies: NKDA  Psychiatric/Behavioral:  Positive for substance abuse (UDS (+) for methamphetamine). Negative for depression, hallucinations, memory loss and suicidal ideas. The patient is not nervous/anxious and does not have insomnia.    Blood pressure 104/84, pulse (!) 110, temperature 98 F (36.7 C), temperature source Oral, resp. rate 18, height 5\' 7"  (1.702 m), weight 79.8 kg, SpO2 98 %. Body mass index is 27.57 kg/m.  Treatment Plan Summary: Daily contact with patient to assess and evaluate symptoms and progress in treatment and Medication management.   Continue inpatient hospitalization.  Will continue today 10/14/2021 plan as below except where it is noted.   Diagnosis:  Bipolar II disorder.  Generalized anxiety disorder.   Plan.  Bipolar II disorder. Continue Vraylar 1.5 mg po daily.  Continue Wellbutrin XL 150 mg po daily.   Anxiety.  Continue Vistaril 25 mg po tid prn.   Insomnia. Continue Trazodone 50 mg po Q hs prn.   Continue other prn medications for other medical complaints.   Agitation protocols.  Continue agitation protocol regimen as recommended prn.                -- Patient's case to be discussed in multi-disciplinary team meetings             -- Observation  Level : q15 minute checks             -- Vital signs:  q12 hours             -- Precautions: suicide, elopement, and assault  Labs ordered: Repeat UA and Hepatic function panel.    Discharge Planning:              -- Social work and case management to assist with discharge planning and identification of hospital follow-up needs prior to discharge. Patient is interested in PHP/IOP programs.              -- Estimated LOS: 5-7 days             -- Discharge Concerns: Need to establish a safety plan; Medication compliance/effectiveness.               Ana Spar, NP, pmhnp, fnp-bc 10/14/2021, 1:15 PM Patient ID: Ana Best, female   DOB: 1986/11/13, 35 y.o.   MRN: [id number] Patient ID: Ana Best, female   DOB: [birth year], 35 y.o.   MRN: [id number]

## 2021-10-14 NOTE — BHH Group Notes (Signed)
Pt did not participate in Psycho-Ed group this afternoon.

## 2021-10-14 NOTE — Progress Notes (Signed)
     10/13/21 2205  Psych Admission Type (Psych Patients Only)  Admission Status Involuntary  Psychosocial Assessment  Patient Complaints None  Eye Contact Fair  Facial Expression Animated  Affect Appropriate to circumstance  Speech Logical/coherent  Interaction Assertive  Motor Activity Other (Comment) (wnl)  Appearance/Hygiene Unremarkable  Behavior Characteristics Anxious  Mood Depressed;Anxious  Thought Process  Coherency Concrete thinking  Content WDL  Delusions None reported or observed  Perception WDL  Hallucination None reported or observed  Judgment Impaired  Confusion None  Danger to Self  Current suicidal ideation? Denies  Danger to Others  Danger to Others None reported or observed

## 2021-10-14 NOTE — Plan of Care (Signed)
?  Problem: Education: ?Goal: Ability to state activities that reduce stress will improve ?Outcome: Not Progressing ?  ?Problem: Coping: ?Goal: Ability to identify and develop effective coping behavior will improve ?Outcome: Not Progressing ?  ?Problem: Self-Concept: ?Goal: Ability to identify factors that promote anxiety will improve ?Outcome: Not Progressing ?  ?

## 2021-10-15 ENCOUNTER — Encounter (HOSPITAL_COMMUNITY): Payer: Self-pay

## 2021-10-15 DIAGNOSIS — F3181 Bipolar II disorder: Secondary | ICD-10-CM

## 2021-10-15 DIAGNOSIS — F3113 Bipolar disorder, current episode manic without psychotic features, severe: Secondary | ICD-10-CM

## 2021-10-15 LAB — URINE CULTURE: Culture: <10000 — AB

## 2021-10-15 MED ORDER — BUPROPION HCL ER (XL) 150 MG PO TB24: 150.0000 mg | ORAL_TABLET | Freq: Every day | ORAL | 0 refills | Status: DC

## 2021-10-15 MED ORDER — NICOTINE POLACRILEX 2 MG MT GUM: 2.0000 mg | CHEWING_GUM | OROMUCOSAL | 0 refills | Status: DC | PRN

## 2021-10-15 MED ORDER — CARIPRAZINE HCL 1.5 MG PO CAPS
1.5000 mg | ORAL_CAPSULE | Freq: Every day | ORAL | 0 refills | Status: DC
Start: 2021-10-16 — End: 2023-05-09

## 2021-10-15 MED ORDER — HYDROXYZINE HCL 25 MG PO TABS: 25.0000 mg | ORAL_TABLET | Freq: Three times a day (TID) | ORAL | 0 refills | Status: DC | PRN

## 2021-10-15 NOTE — BH IP Treatment Plan (Signed)
Interdisciplinary Treatment and Diagnostic Plan Update  10/15/2021 Time of Session: 0900 Ana Best MRN: [id number]  Principal Diagnosis: Bipolar I disorder, current or most recent episode manic, severe ([place])  Secondary Diagnoses: Principal Problem:   Bipolar I disorder, current or most recent episode manic, severe (HCC) Active Problems:   Nicotine addiction   Difficulty controlling anger   Psychoactive substance-induced psychosis ([place])   Generalized anxiety disorder   Moderate cannabis use disorder ([place])   Current Medications:  Current Facility-Administered Medications  Medication Dose Route Frequency Provider Last Rate Last Admin   acetaminophen (TYLENOL) tablet 650 mg  650 mg Oral Q6H PRN Chalmers Guest, NP       alum & mag hydroxide-simeth (MAALOX/MYLANTA) 200-200-20 MG/5ML suspension 30 mL  30 mL Oral Q4H PRN Chalmers Guest, NP       buPROPion (WELLBUTRIN XL) 24 hr tablet 150 mg  150 mg Oral Daily Chalmers Guest, NP   150 mg at 10/15/21 5573   cariprazine (VRAYLAR) capsule 1.5 mg  1.5 mg Oral Daily Lindell Spar I, NP   1.5 mg at 10/15/21 2202   hydrOXYzine (ATARAX/VISTARIL) tablet 25 mg  25 mg Oral TID PRN Chalmers Guest, NP   25 mg at 10/11/21 2136   risperiDONE (RISPERDAL M-TABS) disintegrating tablet 2 mg  2 mg Oral Q8H PRN Chalmers Guest, NP       And   LORazepam (ATIVAN) tablet 1 mg  1 mg Oral PRN Chalmers Guest, NP       And   ziprasidone (GEODON) injection 20 mg  20 mg Intramuscular PRN Chalmers Guest, NP       magnesium hydroxide (MILK OF MAGNESIA) suspension 30 mL  30 mL Oral Daily PRN Chalmers Guest, NP       nicotine polacrilex (NICORETTE) gum 2 mg  2 mg Oral PRN Lindell Spar I, NP   2 mg at 10/15/21 5427   traZODone (DESYREL) tablet 50 mg  50 mg Oral QHS PRN Chalmers Guest, NP   50 mg at 10/11/21 2136   PTA Medications: Medications Prior to Admission  Medication Sig Dispense Refill Last Dose   amphetamine-dextroamphetamine (ADDERALL) 20 MG tablet Take 20 mg  by mouth in the morning, at noon, and at bedtime.   10/08/2021   buPROPion (WELLBUTRIN XL) 150 MG 24 hr tablet Take 1 tablet (150 mg total) by mouth every morning. 30 tablet 2 Past Week   cariprazine (VRAYLAR) 1.5 MG capsule Take 1.5 mg by mouth at bedtime.   10/08/2021   traZODone (DESYREL) 50 MG tablet Take 50 mg by mouth at bedtime as needed for sleep.   10/08/2021    Patient Stressors: Health problems   Marital or family conflict   Medication change or noncompliance   Substance abuse    Patient Strengths: Special hobby/interest  Supportive family/friends   Treatment Modalities: Medication Management, Group therapy, Case management,  1 to 1 session with clinician, Psychoeducation, Recreational therapy.   Physician Treatment Plan for Primary Diagnosis: Bipolar I disorder, current or most recent episode manic, severe ([place]) Long Term Goal(s): Improvement in symptoms so as ready for discharge   Short Term Goals: Ability to identify and develop effective coping behaviors will improve Ability to maintain clinical measurements within normal limits will improve Ability to identify triggers associated with substance abuse/mental health issues will improve Ability to identify changes in lifestyle to reduce recurrence of condition will improve Ability to verbalize feelings will improve Ability to disclose  and discuss suicidal ideas Ability to demonstrate self-control will improve  Medication Management: Evaluate patient's response, side effects, and tolerance of medication regimen.  Therapeutic Interventions: 1 to 1 sessions, Unit Group sessions and Medication administration.  Evaluation of Outcomes: Not Met  Physician Treatment Plan for Secondary Diagnosis: Principal Problem:   Bipolar I disorder, current or most recent episode manic, severe (HCC) Active Problems:   Nicotine addiction   Difficulty controlling anger   Psychoactive substance-induced psychosis ([place])   Generalized  anxiety disorder   Moderate cannabis use disorder ([place])  Long Term Goal(s): Improvement in symptoms so as ready for discharge   Short Term Goals: Ability to identify and develop effective coping behaviors will improve Ability to maintain clinical measurements within normal limits will improve Ability to identify triggers associated with substance abuse/mental health issues will improve Ability to identify changes in lifestyle to reduce recurrence of condition will improve Ability to verbalize feelings will improve Ability to disclose and discuss suicidal ideas Ability to demonstrate self-control will improve     Medication Management: Evaluate patient's response, side effects, and tolerance of medication regimen.  Therapeutic Interventions: 1 to 1 sessions, Unit Group sessions and Medication administration.  Evaluation of Outcomes: Not Met   RN Treatment Plan for Primary Diagnosis: Bipolar I disorder, current or most recent episode manic, severe ([place]) Long Term Goal(s): Knowledge of disease and therapeutic regimen to maintain health will improve  Short Term Goals: Ability to remain free from injury will improve, Ability to verbalize frustration and anger appropriately will improve, Ability to demonstrate self-control, Ability to participate in decision making will improve, Ability to verbalize feelings will improve, Ability to disclose and discuss suicidal ideas, Ability to identify and develop effective coping behaviors will improve, and Compliance with prescribed medications will improve  Medication Management: RN will administer medications as ordered by provider, will assess and evaluate patient's response and provide education to patient for prescribed medication. RN will report any adverse and/or side effects to prescribing provider.  Therapeutic Interventions: 1 on 1 counseling sessions, Psychoeducation, Medication administration, Evaluate responses to treatment, Monitor vital signs  and CBGs as ordered, Perform/monitor CIWA, COWS, AIMS and Fall Risk screenings as ordered, Perform wound care treatments as ordered.  Evaluation of Outcomes: Not Met   LCSW Treatment Plan for Primary Diagnosis: Bipolar I disorder, current or most recent episode manic, severe ([place]) Long Term Goal(s): Safe transition to appropriate next level of care at discharge, Engage patient in therapeutic group addressing interpersonal concerns.  Short Term Goals: Engage patient in aftercare planning with referrals and resources, Increase social support, Increase ability to appropriately verbalize feelings, Increase emotional regulation, Facilitate acceptance of mental health diagnosis and concerns, Facilitate patient progression through stages of change regarding substance use diagnoses and concerns, Identify triggers associated with mental health/substance abuse issues, and Increase skills for wellness and recovery  Therapeutic Interventions: Assess for all discharge needs, 1 to 1 time with Social worker, Explore available resources and support systems, Assess for adequacy in community support network, Educate family and significant other(s) on suicide prevention, Complete Psychosocial Assessment, Interpersonal group therapy.  Evaluation of Outcomes: Not Met   Progress in Treatment: Attending groups: No. Participating in groups: No. Taking medication as prescribed: Yes. Toleration medication: Yes. Family/Significant other contact made: Yes, individual(s) contacted:  SPE Completed with Gwenlyn Found, Mother at [phone] Patient understands diagnosis: Yes. Discussing patient identified problems/goals with staff: Yes. Medical problems stabilized or resolved: Yes. Denies suicidal/homicidal ideation: No. Issues/concerns per patient self-inventory: Yes. Other: none  New problem(s) identified: No, Describe:  No additional problems identified at this time.   New Short Term/Long Term Goal(s): No  additional goals identified at this time.   Patient Goals:  No additional goals identified at this time.  Discharge Plan or Barriers:  No barriers identified at this time.   Reason for Continuation of Hospitalization: Other; describe Paranoia, medication non-compliance, substance use.   Estimated Length of Stay: TBD    Scribe for Treatment Team: Larose Kells 10/15/2021 10:01 AM

## 2021-10-15 NOTE — Progress Notes (Signed)
   10/14/21 2236  Psych Admission Type (Psych Patients Only)  Admission Status Involuntary  Psychosocial Assessment  Patient Complaints None  Eye Contact Fair  Facial Expression Other (Comment) (PT SLEEPING)  Affect UTA  Speech UTA  Interaction Other (Comment) (PT SLEEPING)  Motor Activity Other (Comment) (PT SLEEPING)  Appearance/Hygiene Unremarkable  Behavior Characteristics Other (Comment) (PT SLEEPING)  Mood Other (Comment) (PT SLEEPING)  Thought Process  Coherency Unable to assess  Content UTA  Delusions UTA  Perception UTA  Hallucination UTA  Judgment UTA  Confusion UTA  Danger to Self  Current suicidal ideation? Denies  Danger to Others  Danger to Others None reported or observed  Pt sleeping all evening. Respiration regular and unlabored. Pt is safe

## 2021-10-15 NOTE — Progress Notes (Signed)
Discharge note:  Patient competed survey of [facility] and care recieved. RN met with pt and reviewed pt's discharge instructions. Pt verbalized understanding of discharge instructions and pt did not have any questions. RN reviewed and provided pt with a copy of SRA, AVS and Transition Record. RN returned pt's belongings to pt. Pt denied SI/HI/AVH and voiced no concerns. Pt was appreciative of the care pt received at [facility]. Patient states she is ready to leave and "never want to come back." Patient excited to see mother and to leave. Patient discharged to the lobby without incident.

## 2021-10-15 NOTE — BHH Group Notes (Signed)
Pt did not attend morning goals group as well as morning psycho-ed group.  
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
## 2021-10-15 NOTE — Progress Notes (Signed)
  [facility] Adult Case Management Discharge Plan :  Will you be returning to the same living situation after discharge:  Yes,  mother's home At discharge, do you have transportation home?: Yes,  via mother Do you have the ability to pay for your medications: Yes,  Medicaid  Release of information consent forms completed and in the chart;  Patient's signature needed at discharge.  Patient to Follow up at:  Follow-up Information     [facility]. Go to.   Specialty: Urgent Care Why: Please go to this provider for therapy and medication management services during walk in hours:  Monday through Wednesday, from 7:45 am to 11:00 am.  Services are provided on a first come, first served basis; please arrive early. Contact information: [address] [place] [address] [phone]                Next level of care provider has access to [facility] Link:yes  Safety Planning and Suicide Prevention discussed: Yes,  mother     Has patient been referred to the Quitline?: Patient refused referral  Patient has been referred for addiction treatment: Pt. refused referral  Felizardo Hoffmann, LCSWA 10/15/2021, 10:20 AM

## 2022-09-20 ENCOUNTER — Other Ambulatory Visit: Payer: Self-pay

## 2022-09-20 ENCOUNTER — Emergency Department (HOSPITAL_COMMUNITY)
Admission: EM | Admit: 2022-09-20 | Discharge: 2022-09-23 | Disposition: A | Discharge status: Home / Self Care | Payer: Medicaid Other | Attending: Emergency Medicine | Admitting: Emergency Medicine

## 2022-09-20 ENCOUNTER — Encounter (HOSPITAL_COMMUNITY): Payer: Self-pay

## 2022-09-20 DIAGNOSIS — R259 Unspecified abnormal involuntary movements: Secondary | ICD-10-CM | POA: Insufficient documentation

## 2022-09-20 DIAGNOSIS — F39 Unspecified mood [affective] disorder: Secondary | ICD-10-CM | POA: Insufficient documentation

## 2022-09-20 DIAGNOSIS — Z1152 Encounter for screening for COVID-19: Secondary | ICD-10-CM | POA: Insufficient documentation

## 2022-09-20 DIAGNOSIS — Z046 Encounter for general psychiatric examination, requested by authority: Secondary | ICD-10-CM | POA: Diagnosis not present

## 2022-09-20 DIAGNOSIS — Z1339 Encounter for screening examination for other mental health and behavioral disorders: Secondary | ICD-10-CM | POA: Insufficient documentation

## 2022-09-20 LAB — COMPREHENSIVE METABOLIC PANEL
ALT: 23 U/L (ref 0–44)
AST: 25 U/L (ref 15–41)
Albumin: 4 g/dL (ref 3.5–5.0)
Alkaline Phosphatase: 40 U/L (ref 38–126)
Anion gap: 8 (ref 5–15)
BUN: 13 mg/dL (ref 6–20)
CO2: 22 mmol/L (ref 22–32)
Calcium: 8.9 mg/dL (ref 8.9–10.3)
Chloride: 110 mmol/L (ref 98–111)
Creatinine, Ser: 0.77 mg/dL (ref 0.44–1.00)
GFR, Estimated: >60 mL/min (ref >60)
Glucose, Bld: 95 mg/dL (ref 70–99)
Potassium: 3.5 mmol/L (ref 3.5–5.1)
Sodium: 140 mmol/L (ref 135–145)
Total Bilirubin: 0.9 mg/dL (ref 0.3–1.2)
Total Protein: 6.6 g/dL (ref 6.5–8.1)

## 2022-09-20 LAB — ETHANOL: Alcohol, Ethyl (B): <10 mg/dL (ref <10)

## 2022-09-20 LAB — CBC WITH DIFFERENTIAL/PLATELET
Abs Immature Granulocytes: 0.02 10*3/uL (ref 0.00–0.07)
Basophils Absolute: 0.1 10*3/uL (ref 0.0–0.1)
Basophils Relative: 1 %
Eosinophils Absolute: 0.1 10*3/uL (ref 0.0–0.5)
Eosinophils Relative: 1 %
HCT: 36.4 % (ref 36.0–46.0)
Hemoglobin: 12 g/dL (ref 12.0–15.0)
Immature Granulocytes: 0 %
Lymphocytes Relative: 19 %
Lymphs Abs: 1.5 10*3/uL (ref 0.7–4.0)
MCH: 30.1 pg (ref 26.0–34.0)
MCHC: 33 g/dL (ref 30.0–36.0)
MCV: 91.2 fL (ref 80.0–100.0)
Monocytes Absolute: 0.7 10*3/uL (ref 0.1–1.0)
Monocytes Relative: 10 %
Neutro Abs: 5.3 10*3/uL (ref 1.7–7.7)
Neutrophils Relative %: 69 %
Platelets: 263 10*3/uL (ref 150–400)
RBC: 3.99 MIL/uL (ref 3.87–5.11)
RDW: 12.1 % (ref 11.5–15.5)
WBC: 7.7 10*3/uL (ref 4.0–10.5)
nRBC: 0 % (ref 0.0–0.2)

## 2022-09-20 LAB — I-STAT BETA HCG BLOOD, ED (MC, WL, AP ONLY): I-stat hCG, quantitative: <5 m[IU]/mL (ref <5)

## 2022-09-20 MED ORDER — ACETAMINOPHEN 325 MG PO TABS: 650.0000 mg | ORAL_TABLET | ORAL | Status: DC | PRN

## 2022-09-20 MED ORDER — LORAZEPAM 2 MG/ML IJ SOLN
2.0000 mg | Freq: Once | INTRAMUSCULAR | Status: AC | PRN
  Administered 2022-09-21: 2 mg via INTRAMUSCULAR
  Filled 2022-09-20: qty 1

## 2022-09-20 MED ORDER — ZIPRASIDONE MESYLATE 20 MG IM SOLR: 10.0000 mg | Freq: Once | INTRAMUSCULAR | Status: DC | PRN
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
## 2022-09-20 NOTE — ED Notes (Signed)
Pt scrubbed out in purple scrubs. Pt belongings placed in cabinet 16-18. Pt has one bag with a shirt and leggings.
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
## 2022-09-21 DIAGNOSIS — F39 Unspecified mood [affective] disorder: Secondary | ICD-10-CM | POA: Diagnosis present

## 2022-09-21 LAB — RESP PANEL BY RT-PCR (FLU A&B, COVID) ARPGX2
Influenza A by PCR: NEGATIVE
Influenza B by PCR: NEGATIVE
SARS Coronavirus 2 by RT PCR: NEGATIVE

## 2022-09-21 MED ORDER — CARIPRAZINE HCL 1.5 MG PO CAPS
1.5000 mg | ORAL_CAPSULE | Freq: Every day | ORAL | Status: DC
  Administered 2022-09-22: 1.5 mg via ORAL
  Filled 2022-09-21 (×3): qty 1

## 2022-09-21 NOTE — ED Notes (Signed)
Pt released from restraints. Pt has verbalized understanding of expectations from staff. Alert and oriented, calm and cooperative at this time. Pt has been offered dinner but declining at this time. Pt was informed she has medication to take and she is currently refusing to take it. Pt has been provided with 2 warm blankets and water at bedside.
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
## 2022-09-21 NOTE — Progress Notes (Signed)
Patient has been denied by [facility] due to no appropriate beds available. Patient meets [facility] inpatient criteria per Charmaine Downs, NP. Patient has been faxed out to the following facilities:    [facility]  [place], [place] [address] [phone] [place] [address] [phone]  [facility] [place]  [place], [place] [address] [phone]  [facility]  [address] [place], [place] [address] [phone] [facility]  [place] [address] [place] [address] [place]  [facility]  [address], [place]  [address] [phone]  [place] [place] [address] [phone]  [facility]  [place] [address] [phone] [place]  [address] [place] [address] [phone] [facility]  [address] [place] [address] [phone]  [facility]  [address], [place] [address] [phone] [facility]  [address] [place] [address] [place]  [facility]  [address] [place] [address] [phone]  [place] [place] [address] [place]  [facility]  [address] [place] [address] [phone] [place], MSW, LCSW-A  9:44 PM 09/21/2022

## 2022-09-21 NOTE — ED Notes (Addendum)
Pt attempted to leave tcu area thru back door. States she's is here unwillingly and did nothing wrong is requesting to see IVC paperwork.

## 2022-09-21 NOTE — ED Notes (Signed)
Pt is awake declined breakfast tray just wanted a sprite and requesting to speak with doctor nurse aware.

## 2022-09-21 NOTE — ED Notes (Signed)
Pt denied lunch tray. 
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
## 2022-09-21 NOTE — ED Provider Notes (Signed)
Emergency Medicine Observation Re-evaluation Note  Ana Best is a 36 y.o. female, seen on rounds today.  Pt initially presented to the ED for complaints of Psychiatric Evaluation Currently, the patient is resting in bed.  Physical Exam  BP 120/64 (BP Location: Left Arm)   Pulse (!) 58   Temp 98.3 F (36.8 C) (Oral)   Resp 18   SpO2 96%  Physical Exam General: Resting and interactive Cardiac: No murmur on my exam Lungs: Clear on my auscultation Psych: No agitation  ED Course / MDM  EKG:EKG Interpretation  Date/Time:  Tuesday September 20 2022 21:32:17 EDT Ventricular Rate:  79 PR Interval:  151 QRS Duration: 88 QT Interval:  383 QTC Calculation: 439 R Axis:   93 Text Interpretation: Sinus rhythm Borderline right axis deviation No old tracing to compare Confirmed by Aletta Edouard [phone]) on 09/20/2022 9:45:25 PM  I have reviewed the labs performed to date as well as medications administered while in observation.  Recent changes in the last 24 hours include none reported by nursing.  Plan  Current plan is for awaiting further psychiatric recommendations.    Malani Lees, Gwenyth Allegra, MD 09/21/22 1104

## 2022-09-21 NOTE — ED Notes (Signed)
Pt decline dinner tray still at nurse station in case if she wants it later.

## 2022-09-21 NOTE — Progress Notes (Addendum)
Follow-up from [place]. Gilda Crease, [place]  UDS needed and 12 hours without IM medications.  Pt meets inpatient behavioral health placement per There are no available beds at [facility] per [facility] Lynnda Shields, RN. CSW sent referral to out of of network providers.    Destination Service Provider Address Phone Fax  [facility]  [place], [place] [address] [place]  [facility]  [address] [place] [address] [phone]  [facility]  [place], [place] [address] [phone]  [facility]  [address] [place], [place] [address] [phone] [facility]  [place] [place] [address] [place]  [facility]  [address], [place] [address] [phone]  [place] [address] [place] [address] [phone]  [facility]  [place] [address] [phone] [place]  [address], [place] [address] [phone] [facility]  [address] [place] [address] [phone] [place]  [address], [place] [address] [phone] [facility]  [address] [place] [address] [place]  [facility]  [address] [place] [address] [phone]  [place]  [address] [place]  [facility]  [address] [place] [address] [phone] [place], MSW, LCSWA 09/21/2022 4:21 PM

## 2022-09-21 NOTE — ED Notes (Signed)
Pt belongings placed in TCU locker 32. 

## 2022-09-21 NOTE — ED Notes (Addendum)
Spoke with pts mother Tye [place]. Mother is requesting an update when pt is being discharged/moved to another facility. If pt is sent to another [facility] facility, the facility in [facility] is first preference at this time.

## 2022-09-21 NOTE — ED Notes (Signed)
Will update pt vitals once she wakes up.

## 2022-09-21 NOTE — Progress Notes (Signed)
CSW sent pt's referral to [facility] for review [phone]. CSW will assist and follow with placement.  Benjaman Kindler, MSW, LCSWA 09/21/2022 3:10 PM

## 2022-09-22 LAB — RAPID URINE DRUG SCREEN, HOSP PERFORMED
Amphetamines: POSITIVE — AB
Barbiturates: NOT DETECTED
Benzodiazepines: POSITIVE — AB
Cocaine: NOT DETECTED
Opiates: NOT DETECTED
Tetrahydrocannabinol: POSITIVE — AB

## 2022-09-22 MED ORDER — OLANZAPINE 10 MG PO TBDP: 10.0000 mg | ORAL_TABLET | Freq: Three times a day (TID) | ORAL | Status: DC | PRN

## 2022-09-22 MED ORDER — LORAZEPAM 1 MG PO TABS: 1.0000 mg | ORAL_TABLET | ORAL | Status: DC | PRN

## 2022-09-22 MED ORDER — ZIPRASIDONE MESYLATE 20 MG IM SOLR: 20.0000 mg | INTRAMUSCULAR | Status: DC | PRN

## 2022-09-22 NOTE — Progress Notes (Incomplete)
Pt was offered bed at [facility] today 09/22/22 but later bed offer was denied due to unable to locate pt's SSN#. CSW communicated with provider Lindon Romp, NP and nursing care team.   This CSW will continue to seek inpatient behavioral health placement for this pt. Pt meets inpatient criteria per Lindon Romp, NP

## 2022-09-22 NOTE — Progress Notes (Signed)
Inpatient Behavioral Health Placement  Pt meets inpatient criteria per Lindon Romp, NP.  There are no available beds at [facility].Referral was sent to the following facilities;   Destination  Service Provider Address Phone Fax Patient Preferred  [facility]  [place], [place] [address] [place] --  [facility]  [address] [place] [address] [phone] --  [facility]  [place], [place] [address] [phone] --  [facility]  [address] [place], [place] [address] [phone] --  [facility]  [address] [place] [address] [phone] --  [facility]  [address], [place] [address] [phone] --  [place] [address] [phone] --  [facility]  [address], [place] [address] [phone] --  [facility]  [address], [place] [address] [phone] --  [facility]  [address] [place] [address] [phone] --  [facility]  [address], [place] [address] [phone] --  [facility]  [address], [place] [address] [place] --  [facility]  [place], [place] [address] [phone] --  [place]., [place] [address] [phone] --  [facility]  [address] [place] [address] [phone] --    Situation ongoing,  CSW will follow up.   Benjaman Kindler, MSW, LCSWA 09/22/2022   @ 11:48 AM
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
## 2022-09-22 NOTE — Progress Notes (Signed)
Pt was offered bed at [facility] today 09/22/22 but later bed offer was denied due to unable to locate pt's SSN#. CSW communicated with provider Lindon Romp, NP and nursing care team.   This CSW will continue to seek inpatient behavioral health placement for this pt. Pt meets inpatient criteria per Lindon Romp, NP Destination  Service Provider Address Phone Fax  [facility]  [place], [place] [address] [place]  [facility]  [address] [place] [address] [phone]  [facility]  [place], [place] [address] [phone]  [facility]  [address] [place], [place] [address] [phone] [facility]  [address] [place] [address] [place]  [facility]  [address] [place] [address] [phone]  [place] [address] [place] [address] [phone]  [facility]  [place] [address] [phone]  [facility]  [address], [place] [address] [phone] [facility]  [address] [place] [address] [phone] [place]  [address], [place] [phone]  [facility]  [address] [place] [address] [place]  [facility]  [address] [place] [address] [place]  [place]., [place] [address] [place]  [facility]  [address] [place] [address] [phone]  [facility]  [address] [place] [address] [place]  [facility]  [address] [place] [address] [phone] [facility]  [address] [place] [address] [place]  [facility]  [address] [place] [address] [place]  [facility]  [address], [place] [phone] [facility]  [address], [place] [address] [phone] [place], MSW, [facility] 09/23/2022 12:28 AM

## 2022-09-22 NOTE — Progress Notes (Signed)
CSW sent requested information to [facility] UDS, and nursing notes. 2nd shift to follow up.   Benjaman Kindler, MSW, [facility] 09/22/2022 11:49 AM

## 2022-09-22 NOTE — ED Provider Notes (Signed)
Emergency Medicine Observation Re-evaluation Note  Ana Best is a 36 y.o. female, seen on rounds today.  Pt initially presented to the ED for complaints of Psychiatric Evaluation Currently, the patient is resting, calm.  Physical Exam  BP 111/82 (BP Location: Left Arm)   Pulse 77   Temp 97.9 F (36.6 C) (Oral)   Resp 17   SpO2 100%  Physical Exam General: calm, resting.  Cardiac: regular rate. Lungs: breathing comfortably. Psych: resting, calm.   ED Course / MDM    I have reviewed the labs performed to date as well as medications administered while in observation.  Recent changes in the last 24 hours include ED obs, reassessment.   Plan  [facility], adjust meds as need, and disposition patient.       Lajean Saver, MD 09/22/22 [phone]

## 2022-09-23 ENCOUNTER — Inpatient Hospital Stay (HOSPITAL_COMMUNITY)
Admission: AD | Admit: 2022-09-23 | Discharge: 2022-10-04 | DRG: 897 | Disposition: A | Discharge status: Home / Self Care | Payer: Medicaid Other | Source: Intra-hospital | Attending: Psychiatry | Admitting: Psychiatry

## 2022-09-23 ENCOUNTER — Other Ambulatory Visit: Payer: Self-pay

## 2022-09-23 ENCOUNTER — Encounter (HOSPITAL_COMMUNITY): Payer: Self-pay | Admitting: Nurse Practitioner

## 2022-09-23 DIAGNOSIS — K30 Functional dyspepsia: Secondary | ICD-10-CM | POA: Diagnosis present

## 2022-09-23 DIAGNOSIS — F4310 Post-traumatic stress disorder, unspecified: Secondary | ICD-10-CM | POA: Diagnosis present

## 2022-09-23 DIAGNOSIS — F1999 Other psychoactive substance use, unspecified with unspecified psychoactive substance-induced disorder: Secondary | ICD-10-CM | POA: Diagnosis not present

## 2022-09-23 DIAGNOSIS — G4700 Insomnia, unspecified: Secondary | ICD-10-CM | POA: Diagnosis present

## 2022-09-23 DIAGNOSIS — F319 Bipolar disorder, unspecified: Secondary | ICD-10-CM | POA: Diagnosis present

## 2022-09-23 DIAGNOSIS — F19959 Other psychoactive substance use, unspecified with psychoactive substance-induced psychotic disorder, unspecified: Principal | ICD-10-CM | POA: Diagnosis present

## 2022-09-23 DIAGNOSIS — Z20822 Contact with and (suspected) exposure to covid-19: Secondary | ICD-10-CM | POA: Diagnosis present

## 2022-09-23 DIAGNOSIS — F3113 Bipolar disorder, current episode manic without psychotic features, severe: Secondary | ICD-10-CM | POA: Diagnosis not present

## 2022-09-23 DIAGNOSIS — Z91148 Patient's other noncompliance with medication regimen for other reason: Secondary | ICD-10-CM

## 2022-09-23 DIAGNOSIS — K5900 Constipation, unspecified: Secondary | ICD-10-CM | POA: Diagnosis present

## 2022-09-23 DIAGNOSIS — F909 Attention-deficit hyperactivity disorder, unspecified type: Secondary | ICD-10-CM | POA: Diagnosis present

## 2022-09-23 DIAGNOSIS — F39 Unspecified mood [affective] disorder: Secondary | ICD-10-CM | POA: Diagnosis not present

## 2022-09-23 DIAGNOSIS — Z87891 Personal history of nicotine dependence: Secondary | ICD-10-CM | POA: Diagnosis not present

## 2022-09-23 DIAGNOSIS — F1910 Other psychoactive substance abuse, uncomplicated: Secondary | ICD-10-CM | POA: Insufficient documentation

## 2022-09-23 DIAGNOSIS — F1994 Other psychoactive substance use, unspecified with psychoactive substance-induced mood disorder: Secondary | ICD-10-CM | POA: Insufficient documentation

## 2022-09-23 DIAGNOSIS — F3181 Bipolar II disorder: Secondary | ICD-10-CM | POA: Diagnosis present

## 2022-09-23 LAB — SARS CORONAVIRUS 2 BY RT PCR: SARS Coronavirus 2 by RT PCR: NEGATIVE

## 2022-09-23 MED ORDER — NICOTINE 14 MG/24HR TD PT24
14.0000 mg | MEDICATED_PATCH | Freq: Every day | TRANSDERMAL | Status: DC
  Administered 2022-09-24: 14 mg via TRANSDERMAL
  Filled 2022-09-23 (×2): qty 1

## 2022-09-23 MED ORDER — MAGNESIUM HYDROXIDE 400 MG/5ML PO SUSP: 30.0000 mL | Freq: Every day | ORAL | Status: DC | PRN

## 2022-09-23 MED ORDER — OLANZAPINE 10 MG PO TBDP: 10.0000 mg | ORAL_TABLET | Freq: Three times a day (TID) | ORAL | Status: DC | PRN

## 2022-09-23 MED ORDER — ACETAMINOPHEN 325 MG PO TABS: 650.0000 mg | ORAL_TABLET | Freq: Four times a day (QID) | ORAL | Status: DC | PRN

## 2022-09-23 MED ORDER — ALUM & MAG HYDROXIDE-SIMETH 200-200-20 MG/5ML PO SUSP: 30.0000 mL | ORAL | Status: DC | PRN

## 2022-09-23 MED ORDER — OLANZAPINE 5 MG PO TBDP
5.0000 mg | ORAL_TABLET | Freq: Two times a day (BID) | ORAL | Status: DC
  Administered 2022-09-23: 5 mg via ORAL
  Filled 2022-09-23: qty 1

## 2022-09-23 MED ORDER — ZIPRASIDONE MESYLATE 20 MG IM SOLR: 20.0000 mg | INTRAMUSCULAR | Status: DC | PRN

## 2022-09-23 MED ORDER — LORAZEPAM 1 MG PO TABS: 1.0000 mg | ORAL_TABLET | ORAL | Status: DC | PRN

## 2022-09-23 MED ORDER — OLANZAPINE 5 MG PO TBDP
5.0000 mg | ORAL_TABLET | Freq: Two times a day (BID) | ORAL | Status: DC
  Administered 2022-09-23 – 2022-09-24 (×2): 5 mg via ORAL
  Filled 2022-09-23 (×7): qty 1
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
## 2022-09-23 NOTE — Progress Notes (Signed)
   09/23/22 1800  Psych Admission Type (Psych Patients Only)  Admission Status Involuntary  Psychosocial Assessment  Patient Complaints Anxiety;Appetite decrease;Depression;Crying spells;Helplessness;Self-harm thoughts;Self-harm behaviors;Suspiciousness;Worrying  Eye Contact Brief  Facial Expression Anxious;Worried  Affect Anxious;Apprehensive;Depressed  Speech Logical/coherent  Interaction Forwards little;Guarded  Motor Activity Fidgety  Appearance/Hygiene In scrubs  Behavior Characteristics Anxious  Mood Depressed;Anxious  Aggressive Behavior  Effect No apparent injury  Thought Process  Coherency Blocking  Content Blaming others  Delusions None reported or observed  Perception Depersonalization  Hallucination None reported or observed  Judgment Impaired  Confusion None  Danger to Self  Current suicidal ideation? Denies  Danger to Others  Danger to Others None reported or observed

## 2022-09-23 NOTE — Progress Notes (Signed)
Pt initially refuse PO Zyprexa, but pt was informed she was on forced meds she decided to take the PO     09/23/22 2130  Psych Admission Type (Psych Patients Only)  Admission Status Involuntary  Psychosocial Assessment  Patient Complaints Anxiety  Eye Contact Brief  Facial Expression Anxious  Affect Apprehensive;Anxious  Speech Logical/coherent  Interaction Cautious  Motor Activity Slow  Appearance/Hygiene In scrubs  Behavior Characteristics Anxious  Mood Depressed  Aggressive Behavior  Effect No apparent injury  Thought Process  Coherency Circumstantial  Content Blaming others  Delusions None reported or observed  Perception Depersonalization  Hallucination None reported or observed  Judgment Impaired  Confusion None  Danger to Self  Current suicidal ideation? Denies  Danger to Others  Danger to Others None reported or observed

## 2022-09-23 NOTE — [facility] Group Notes (Signed)
Pt didn't attend group. 
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
## 2022-09-23 NOTE — Progress Notes (Signed)
Pt was accepted to [facility] 09/23/22; Bed Assignment 507-2   DX Unspecified affect d/o  Pt meets inpatient criteria per Lindon Romp, NP  Attending Physician will be Dr. Caswell Corwin  Report can be called to: Adult unit: [phone]  Pt can arrive after 5:00pm  Care Team notified: [facility] Oris Drone, RN, Wilmon Arms, RN, and Lindon Romp, NP  [place], [place] 09/23/2022 @ 2:57 PM

## 2022-09-23 NOTE — ED Notes (Signed)
Patient discharged from unit to facility per provider. Patient alert and no s/s of distress. Patient belongings given to [employer] for transport. Discharge belongings given to [employer] for transport. Patient ambulatory off unit, escorted and transported by [employer].

## 2022-09-24 DIAGNOSIS — F3113 Bipolar disorder, current episode manic without psychotic features, severe: Secondary | ICD-10-CM | POA: Diagnosis present

## 2022-09-24 LAB — LIPID PANEL
Cholesterol: 147 mg/dL (ref 0–200)
HDL: 41 mg/dL (ref >40)
LDL Cholesterol: 94 mg/dL (ref 0–99)
Total CHOL/HDL Ratio: 3.6 RATIO
Triglycerides: 61 mg/dL (ref <150)
VLDL: 12 mg/dL (ref 0–40)

## 2022-09-24 LAB — HEMOGLOBIN A1C
Hgb A1c MFr Bld: 4.7 % — ABNORMAL LOW (ref 4.8–5.6)
Mean Plasma Glucose: 88.19 mg/dL

## 2022-09-24 LAB — TSH
TSH: 0.598 u[IU]/mL (ref 0.350–4.500)
TSH: 0.989 u[IU]/mL (ref 0.350–4.500)

## 2022-09-24 LAB — T4, FREE: Free T4: 0.85 ng/dL (ref 0.61–1.12)

## 2022-09-24 MED ORDER — HALOPERIDOL LACTATE 5 MG/ML IJ SOLN: 5.0000 mg | Freq: Four times a day (QID) | INTRAMUSCULAR | Status: DC | PRN

## 2022-09-24 MED ORDER — PALIPERIDONE ER 3 MG PO TB24: 3.0000 mg | ORAL_TABLET | Freq: Four times a day (QID) | ORAL | Status: DC | PRN

## 2022-09-24 MED ORDER — PALIPERIDONE ER 6 MG PO TB24
6.0000 mg | ORAL_TABLET | Freq: Every day | ORAL | Status: DC
  Administered 2022-09-24 – 2022-09-25 (×2): 6 mg via ORAL
  Filled 2022-09-24 (×3): qty 1

## 2022-09-24 MED ORDER — BENZTROPINE MESYLATE 1 MG/ML IJ SOLN: 1.0000 mg | Freq: Four times a day (QID) | INTRAMUSCULAR | Status: DC | PRN

## 2022-09-24 MED ORDER — LORAZEPAM 2 MG/ML IJ SOLN: 1.0000 mg | Freq: Four times a day (QID) | INTRAMUSCULAR | Status: DC | PRN

## 2022-09-24 MED ORDER — LORAZEPAM 1 MG PO TABS
1.0000 mg | ORAL_TABLET | Freq: Four times a day (QID) | ORAL | Status: DC | PRN
  Administered 2022-09-25 (×2): 1 mg via ORAL
  Filled 2022-09-24 (×2): qty 1

## 2022-09-24 NOTE — Group Note (Addendum)
  BHH/BMU LCSW Group Therapy Note  Date/Time:  09/24/2022 11:15AM-12:00PM  Type of Therapy and Topic:  Group Therapy:  Feelings About Hospitalization  Participation Level:  Minimal   Description of Group This process group involved patients discussing their feelings related to being hospitalized, as well as the benefits they see to being in the hospital.  These feelings and benefits were itemized.  The group then brainstormed specific ways in which they could seek those same benefits when they discharge and return home.  Therapeutic Goals Patient will identify and describe positive and negative feelings related to hospitalization Patient will verbalize benefits of hospitalization to themselves personally Patients will brainstorm together ways they can obtain similar benefits in the outpatient setting, identify barriers to wellness and possible solutions  Summary of Patient Progress:  The patient expressed her primary feelings about being hospitalized are "angry" and "I'm not supposed to be here."  She did say that since she is here, she is determined to "make the best of it."  Rather than participate in the discussion, she left the room and did not return.  Therapeutic Modalities Cognitive Behavioral Therapy Motivational Interviewing    [place] Dominion, LCSW 09/24/2022, 1:48 PM
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
## 2022-09-24 NOTE — Progress Notes (Signed)

## 2022-09-24 NOTE — Progress Notes (Signed)
   09/24/22 2000  Psychosocial Assessment  Eye Contact Brief  Facial Expression Anxious  Affect Apprehensive;Anxious  Speech Logical/coherent  Interaction Cautious  Motor Activity Slow  Appearance/Hygiene In scrubs  Behavior Characteristics Cooperative;Anxious  Mood Depressed  Aggressive Behavior  Effect No apparent injury  Thought Process  Coherency Circumstantial  Content Blaming others  Delusions None reported or observed  Perception Depersonalization  Hallucination None reported or observed  Judgment Impaired  Confusion None  Danger to Self  Current suicidal ideation? Denies  Danger to Others  Danger to Others None reported or observed

## 2022-09-24 NOTE — Progress Notes (Signed)
Adult Psychoeducational Group Note  Date:  09/24/2022 Time:  9:42 PM  Group Topic/Focus:  Wrap-Up Group:   The focus of this group is to help patients review their daily goal of treatment and discuss progress on daily workbooks.  Participation Level:  Did Not Attend  Participation Quality:   Did Not Attend  Affect:   Did Not Attend  Cognitive:   Did Not Attend  Insight: None  Engagement in Group:   Did Not Attend  Modes of Intervention:   Did Not Attend  Additional Comments:  Pt was encouraged to attend wrap up group but did not attend.  Candy Sledge 09/24/2022, 9:42 PM

## 2022-09-24 NOTE — BHH Counselor (Signed)
Clinical Social Work Note  CSW attempted to meet with patient in her room to complete the Psychosocial Assessment.  She could not be awakened.  CSW team will continue efforts to complete PSA.  Selmer Dominion, LCSW 09/24/2022, 4:34 PM

## 2022-09-24 NOTE — BHH Group Notes (Signed)
sychoEducational group- Goals group. Pt did not attend. 

## 2022-09-24 NOTE — Progress Notes (Signed)
   09/24/22 0515  Sleep  Number of Hours 8
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
## 2022-09-25 NOTE — BHH Group Notes (Signed)
Adult Psychoeducational Group Note Date:  09/25/2022 Time:  0900-1000 Group Topic/Focus: PROGRESSIVE RELAXATION. A group where deep breathing is taught and tensing and relaxation muscle groups is used. Imagery is used as well.  Pts are asked to imagine 3 pillars that hold them up when they are not able to hold themselves up and to share that with the group.   Participation Level:  did not attend   : Ana Best A   

## 2022-09-25 NOTE — Progress Notes (Signed)
Adult Psychoeducational Group Note  Date:  09/25/2022 Time:  8:40 PM  Group Topic/Focus:  Wrap-Up Group:   The focus of this group is to help patients review their daily goal of treatment and discuss progress on daily workbooks.  Participation Level:  Did Not Attend  Participation Quality:   n/a  Affect:   n/a  Cognitive:   n/a  Insight: None  Engagement in Group:   n/a  Modes of Intervention:   n/a  Additional Comments:   Pt did not attend the Wrap Up group.  Ana Best 09/25/2022, 8:40 PM
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
## 2022-09-25 NOTE — BHH Suicide Risk Assessment (Signed)
[place] INPATIENT:  Family/Significant Other Suicide Prevention Education  Suicide Prevention Education:  Patient Refusal for Family/Significant Other Suicide Prevention Education: The patient Ana Best has refused to provide written consent for family/significant other to be provided Family/Significant Other Suicide Prevention Education during admission and/or prior to discharge.  Physician notified.  Berlin Hun Grossman-Orr 09/25/2022, 2:46 PM

## 2022-09-25 NOTE — Progress Notes (Signed)
   09/25/22 2100  Psych Admission Type (Psych Patients Only)  Admission Status Involuntary  Psychosocial Assessment  Patient Complaints Anxiety  Eye Contact Brief  Facial Expression Flat  Affect Apprehensive  Speech Logical/coherent  Interaction Cautious  Motor Activity Slow  Appearance/Hygiene In scrubs  Behavior Characteristics Cooperative  Mood Anxious  Aggressive Behavior  Effect No apparent injury  Thought Process  Coherency Circumstantial  Content Blaming others  Delusions None reported or observed  Perception WDL  Hallucination None reported or observed  Judgment Impaired  Confusion None  Danger to Self  Current suicidal ideation? Denies  Danger to Others  Danger to Others None reported or observed

## 2022-09-25 NOTE — Group Note (Signed)
[place] LCSW Group Therapy Note  Date/Time:  09/25/2022  11:00AM-12:00PM  Type of Therapy and Topic:  Group Therapy:  Music and Mood  Participation Level:  Did Not Attend   Description of Group: In this process group, members listened to a variety of genres of music and identified that different types of music evoke different responses.  Patients were encouraged to identify music that was soothing for them and music that was energizing for them.  Patients discussed how this knowledge can help with wellness and recovery in various ways including managing depression and anxiety as well as encouraging healthy sleep habits.    Therapeutic Goals: Patients will explore the impact of different varieties of music on mood Patients will verbalize the thoughts they have when listening to different types of music Patients will identify music that is soothing to them as well as music that is energizing to them Patients will discuss how to use this knowledge to assist in maintaining wellness and recovery Patients will explore the use of music as a coping skill  Summary of Patient Progress:  Patient was invited to group, did not attend.   Therapeutic Modalities: Solution Focused Brief Therapy Activity   Selmer Dominion, LCSW

## 2022-09-25 NOTE — Progress Notes (Signed)
   09/25/22 0500  Sleep  Number of Hours 8

## 2022-09-25 NOTE — Plan of Care (Signed)
Patient alert, verbal and able to make needs known. Patient pleasant today but does seem restless. She is seen pacing back and forth up the hallway, she will come in the dayroom but quickly leaves. Denies SI/HI/AVH to Probation officer. Patient is safe.      Problem: Education: Goal: Emotional status will improve Outcome: Progressing Goal: Mental status will improve Outcome: Progressing Goal: Verbalization of understanding the information provided will improve Outcome: Progressing

## 2022-09-26 ENCOUNTER — Encounter (HOSPITAL_COMMUNITY): Payer: Self-pay

## 2022-09-26 DIAGNOSIS — F319 Bipolar disorder, unspecified: Secondary | ICD-10-CM

## 2022-09-26 LAB — T3, FREE: T3, Free: 2.2 pg/mL (ref 2.0–4.4)

## 2022-09-26 LAB — HCV RNA QUANT
HCV Quantitative Log: 4.607 log10 IU/mL (ref >1.70)
HCV Quantitative: 40500 IU/mL (ref >50)

## 2022-09-26 MED ORDER — LORAZEPAM 1 MG PO TABS
1.0000 mg | ORAL_TABLET | Freq: Two times a day (BID) | ORAL | Status: DC | PRN
  Administered 2022-10-02: 1 mg via ORAL
  Filled 2022-09-26: qty 1

## 2022-09-26 MED ORDER — LORAZEPAM 1 MG PO TABS: 1.0000 mg | ORAL_TABLET | Freq: Two times a day (BID) | ORAL | Status: DC | PRN

## 2022-09-26 MED ORDER — RISPERIDONE 2 MG PO TBDP: 2.0000 mg | ORAL_TABLET | Freq: Two times a day (BID) | ORAL | Status: DC | PRN

## 2022-09-26 MED ORDER — TRAZODONE HCL 50 MG PO TABS
50.0000 mg | ORAL_TABLET | Freq: Every evening | ORAL | Status: DC | PRN
  Administered 2022-09-26: 50 mg via ORAL
  Filled 2022-09-26: qty 1

## 2022-09-26 MED ORDER — HYDROXYZINE HCL 25 MG PO TABS
25.0000 mg | ORAL_TABLET | Freq: Three times a day (TID) | ORAL | Status: DC | PRN
  Administered 2022-09-26 – 2022-10-03 (×5): 25 mg via ORAL
  Filled 2022-09-26 (×5): qty 1

## 2022-09-26 MED ORDER — RISPERIDONE 1 MG PO TBDP
1.0000 mg | ORAL_TABLET | Freq: Every day | ORAL | Status: DC
  Administered 2022-09-27 – 2022-09-29 (×3): 1 mg via ORAL
  Filled 2022-09-26 (×5): qty 1

## 2022-09-26 MED ORDER — RISPERIDONE 1 MG PO TBDP: 1.0000 mg | ORAL_TABLET | Freq: Two times a day (BID) | ORAL | Status: DC | PRN

## 2022-09-26 MED ORDER — RISPERIDONE 2 MG PO TBDP
2.0000 mg | ORAL_TABLET | Freq: Every day | ORAL | Status: DC
  Administered 2022-09-26 – 2022-09-28 (×3): 2 mg via ORAL
  Filled 2022-09-26 (×6): qty 1

## 2022-09-26 MED ORDER — OLANZAPINE 10 MG IM SOLR: 10.0000 mg | Freq: Three times a day (TID) | INTRAMUSCULAR | Status: DC | PRN

## 2022-09-26 NOTE — Group Note (Signed)
[facility] LCSW Group Therapy Note    Group Date: 09/26/2022 Start Time: 1300 End Time: 1400  Type of Therapy and Topic:  Group Therapy:  Overcoming Obstacles  Participation Level:  BHH PARTICIPATION LEVEL: Did Not Attend    Description of Group:   In this group patients will be encouraged to explore what they see as obstacles to their own wellness and recovery. They will be guided to discuss their thoughts, feelings, and behaviors related to these obstacles. The group will process together ways to cope with barriers, with attention given to specific choices patients can make. Each patient will be challenged to identify changes they are motivated to make in order to overcome their obstacles. This group will be process-oriented, with patients participating in exploration of their own experiences as well as giving and receiving support and challenge from other group members.  Therapeutic Goals: 1. Patient will identify personal and current obstacles as they relate to admission. 2. Patient will identify barriers that currently interfere with their wellness or overcoming obstacles.  3. Patient will identify feelings, thought process and behaviors related to these barriers. 4. Patient will identify two changes they are willing to make to overcome these obstacles:    Summary of Patient Progress   Patient did not attend group.   Therapeutic Modalities:   Cognitive Behavioral Therapy Solution Focused Therapy Motivational Interviewing Relapse Prevention Therapy   Sherre Lain, LCSWA

## 2022-09-26 NOTE — BH IP Treatment Plan (Signed)
Interdisciplinary Treatment and Diagnostic Plan Update  09/26/2022 Time of Session: 10:10 AM  Sharne Linders MRN: [id number]  Principal Diagnosis: Bipolar affective disorder ([place])  Secondary Diagnoses: Principal Problem:   Bipolar affective disorder ([place]) Active Problems:   Bipolar I disorder, current or most recent episode manic, severe (HCC)   Current Medications:  Current Facility-Administered Medications  Medication Dose Route Frequency Provider Last Rate Last Admin   acetaminophen (TYLENOL) tablet 650 mg  650 mg Oral Q6H PRN Rozetta Nunnery, NP       alum & mag hydroxide-simeth (MAALOX/MYLANTA) 200-200-20 MG/5ML suspension 30 mL  30 mL Oral Q4H PRN Lindon Romp A, NP       haloperidol lactate (HALDOL) injection 5 mg  5 mg Intramuscular Q6H PRN France Ravens, MD       And   benztropine mesylate (COGENTIN) injection 1 mg  1 mg Intramuscular Q6H PRN France Ravens, MD       And   LORazepam (ATIVAN) injection 1 mg  1 mg Intramuscular Q6H PRN France Ravens, MD       hydrOXYzine (ATARAX) tablet 25 mg  25 mg Oral TID PRN France Ravens, MD       risperiDONE (RISPERDAL M-TABS) disintegrating tablet 2 mg  2 mg Oral BID PRN Massengill, Ovid Curd, MD       And   LORazepam (ATIVAN) tablet 1 mg  1 mg Oral BID PRN Massengill, Ovid Curd, MD       magnesium hydroxide (MILK OF MAGNESIA) suspension 30 mL  30 mL Oral Daily PRN Lindon Romp A, NP       risperiDONE (RISPERDAL M-TABS) disintegrating tablet 2 mg  2 mg Oral QHS France Ravens, MD       And   Derrill Memo ON 09/27/2022] risperiDONE (RISPERDAL M-TABS) disintegrating tablet 1 mg  1 mg Oral Daily France Ravens, MD       traZODone (DESYREL) tablet 50 mg  50 mg Oral QHS PRN Massengill, Ovid Curd, MD       PTA Medications: No medications prior to admission.    Patient Stressors:    Patient Strengths:    Treatment Modalities: Medication Management, Group therapy, Case management,  1 to 1 session with clinician, Psychoeducation, Recreational therapy.   Physician  Treatment Plan for Primary Diagnosis: Bipolar affective disorder ([place]) Long Term Goal(s):     Short Term Goals:    Medication Management: Evaluate patient's response, side effects, and tolerance of medication regimen.  Therapeutic Interventions: 1 to 1 sessions, Unit Group sessions and Medication administration.  Evaluation of Outcomes: Progressing  Physician Treatment Plan for Secondary Diagnosis: Principal Problem:   Bipolar affective disorder ([place]) Active Problems:   Bipolar I disorder, current or most recent episode manic, severe ([place])  Long Term Goal(s):     Short Term Goals:       Medication Management: Evaluate patient's response, side effects, and tolerance of medication regimen.  Therapeutic Interventions: 1 to 1 sessions, Unit Group sessions and Medication administration.  Evaluation of Outcomes: Progressing   RN Treatment Plan for Primary Diagnosis: Bipolar affective disorder ([place]) Long Term Goal(s): Knowledge of disease and therapeutic regimen to maintain health will improve  Short Term Goals: Ability to remain free from injury will improve, Ability to verbalize frustration and anger appropriately will improve, Ability to demonstrate self-control, Ability to participate in decision making will improve, Ability to verbalize feelings will improve, Ability to disclose and discuss suicidal ideas, Ability to identify and develop effective coping behaviors will improve, and Compliance with prescribed  medications will improve  Medication Management: RN will administer medications as ordered by provider, will assess and evaluate patient's response and provide education to patient for prescribed medication. RN will report any adverse and/or side effects to prescribing provider.  Therapeutic Interventions: 1 on 1 counseling sessions, Psychoeducation, Medication administration, Evaluate responses to treatment, Monitor vital signs and CBGs as ordered, Perform/monitor CIWA, COWS,  AIMS and Fall Risk screenings as ordered, Perform wound care treatments as ordered.  Evaluation of Outcomes: Progressing   LCSW Treatment Plan for Primary Diagnosis: Bipolar affective disorder (HCC) Long Term Goal(s): Safe transition to appropriate next level of care at discharge, Engage patient in therapeutic group addressing interpersonal concerns.  Short Term Goals: Engage patient in aftercare planning with referrals and resources, Increase social support, Increase ability to appropriately verbalize feelings, Increase emotional regulation, Facilitate acceptance of mental health diagnosis and concerns, Facilitate patient progression through stages of change regarding substance use diagnoses and concerns, Identify triggers associated with mental health/substance abuse issues, and Increase skills for wellness and recovery  Therapeutic Interventions: Assess for all discharge needs, 1 to 1 time with Social worker, Explore available resources and support systems, Assess for adequacy in community support network, Educate family and significant other(s) on suicide prevention, Complete Psychosocial Assessment, Interpersonal group therapy.  Evaluation of Outcomes: Progressing   Progress in Treatment: Attending groups: No. Participating in groups: No. Taking medication as prescribed: Yes. Toleration medication: Yes. Family/Significant other contact made: No, will contact:  patient declined consents  Patient understands diagnosis: No. Discussing patient identified problems/goals with staff: Yes. Medical problems stabilized or resolved: Yes. Denies suicidal/homicidal ideation: Yes. Issues/concerns per patient self-inventory: No.   New problem(s) identified: No, Describe:  None reported   New Short Term/Long Term Goal(s): medication stabilization, elimination of SI thoughts, development of comprehensive mental wellness plan.    Patient Goals:  Patient goal is to learn about what "poison" is in  her blood stream and ask that the doctor run lab work. Overall , patient does have anything she wants to improve on.   Discharge Plan or Barriers: Patient recently admitted. CSW will continue to follow and assess for appropriate referrals and possible discharge planning.    Reason for Continuation of Hospitalization: Aggression Anxiety Delusions  Depression Medication stabilization  Estimated Length of Stay: 5-7 days   Last 3 [place] Suicide Severity Risk Score: Flowsheet Row [facility]Current[facility]  C-SSRS RISK CATEGORY No Risk No Risk          Scribe for Treatment Team: Beather Arbour 09/26/2022 3:30 PM

## 2022-09-26 NOTE — Group Note (Signed)
Recreation Therapy Group Note   Group Topic:Communication  Group Date: 09/26/2022 Start Time: 1000 End Time: 1035 Facilitators: Darriana Deboy-McCall, LRT,CTRS Location: [address] Dayroom   Goal Area(s) Addresses:  Patient will effectively listen to complete activity.  Patient will identify communication skills used to make activity successful.  Patient will identify how skills used during activity can be used to reach post d/c goals.    Group Description:  Geometric Drawings.  Three volunteers from the peer group will be shown an abstract picture with a particular arrangement of geometrical shapes.  Each round, one 'speaker' will describe the pattern, as accurately as possible without revealing the image to the group.  The remaining group members will listen and draw the picture to reflect how it is described to them. Patients with the role of 'listener' cannot ask clarifying questions but, may request that the speaker repeat a direction. Once the drawings are complete, the presenter will show the rest of the group the picture and compare how close each person came to drawing the picture. LRT will facilitate a post-activity discussion regarding effective communication and the importance of planning, listening, and asking for clarification in daily interactions with others.   Affect/Mood: N/A   Participation Level: Did not attend    Clinical Observations/Individualized Feedback:     Plan: Continue to engage patient in RT group sessions 2-3x/week.   Ana Best, LRT,CTRS 09/26/2022 12:21 PM
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
## 2022-09-26 NOTE — Progress Notes (Signed)
Pt noted to be fidgety, restless with blunted affect and labile mood on interactions this shift. Denies SI, HI, AVH and pain. Continues to endorse paranoia related to her mother and her inheritance "I'm fine right now. My only problem is my mother. She's a bitch and she's trying to kill me for my inheritance. I can't stand that bitch". Observed to be verbally abusive, hostile and demanding "bitch, I want what they give me yesterday for my anxiety. I had Ativan when I asked and now you don't want to give it to me. Vistaril is not going to do me any good bitch" and stormed away from med window when informed and offered PRN Vistaril for anxiety. Q 15 minutes safety checks maintained. Emotional support, encouragement and reassurance provided to pt. Verbal education done on current treatment regimen. Pt tolerates meals and fluids well. Refused to attend scheduled groups despite multiple prompts "I don't need that shit" with continuous demand for Ativan. Off unit for meals and courtyard break, returned without issues. Safety maintained on and off unit.

## 2022-09-26 NOTE — Progress Notes (Signed)
   09/26/22 0620  Sleep  Number of Hours 8.5

## 2022-09-26 NOTE — Progress Notes (Signed)
Adult Psychoeducational Group Note  Date:  09/26/2022 Time:  8:44 PM  Group Topic/Focus:  Wrap-Up Group:   The focus of this group is to help patients review their daily goal of treatment and discuss progress on daily workbooks.  Participation Level:  Did Not Attend  Participation Quality:   n/a  Affect:   n/a  Cognitive:   n/a  Insight: None  Engagement in Group:   n/a  Modes of Intervention:   n/a  Additional Comments:   Pt did not attend the Wrap Up group.  Ana Best 09/26/2022, 8:44 PM

## 2022-09-26 NOTE — Progress Notes (Signed)
   09/26/22 2042  Psych Admission Type (Psych Patients Only)  Admission Status Involuntary  Psychosocial Assessment  Patient Complaints Anxiety;Sleep disturbance  Eye Contact Fair  Facial Expression Animated  Affect Appropriate to circumstance  Speech Logical/coherent;Pressured  Interaction Assertive  Motor Activity Fidgety;Pacing  Appearance/Hygiene Unremarkable  Behavior Characteristics Cooperative;Appropriate to situation  Mood Anxious;Pleasant  Aggressive Behavior  Effect No apparent injury  Thought Process  Coherency Circumstantial  Content WDL  Delusions None reported or observed  Perception WDL  Hallucination None reported or observed  Judgment Impaired  Confusion None  Danger to Self  Current suicidal ideation? Denies  Agreement Not to Harm Self Yes  Description of Agreement Verbally contracts for safety.  Danger to Others  Danger to Others None reported or observed   Patient alert and oriented. Presenting appropriate to circumstance with an anxious mood. Patient denies SI, HI, AVH, and pain. Patient states "a little" when asked about anxiety. Patient denies symptoms of depression. Patient reported sleep disturbance and requested PRN trazodone. Scheduled medication and PRN administered to patient, per provider orders. Support and encouragement provided. Routine safety checks conducted every 15 minutes. Patient contracts for safety and remains safe on the unit.

## 2022-09-27 NOTE — Progress Notes (Signed)
The focus of this group is to help patients review their daily goal of treatment and discuss progress on daily workbooks.  Pt attended the evening group and responded to all discussion prompts from the [place]. Pt shared that today was a good day on the unit, the highlight of which was enjoying the company of her peers.  On the subject of staying well upon discharge, Ana Best mentioned wanting to exercise more, smile more, and to remember  more positive daily affirmations.  Pt rated her day a 7 out of 10 and her affect was appropriate.

## 2022-09-27 NOTE — Progress Notes (Signed)
   09/27/22 1100  Psych Admission Type (Psych Patients Only)  Admission Status Involuntary  Psychosocial Assessment  Patient Complaints Anxiety  Eye Contact Brief  Facial Expression Flat  Affect Appropriate to circumstance  Speech Logical/coherent  Interaction Assertive  Motor Activity Fidgety;Pacing  Appearance/Hygiene Improved  Behavior Characteristics Cooperative;Appropriate to situation  Mood Anxious;Pleasant  Aggressive Behavior  Effect No apparent injury  Thought Process  Coherency Circumstantial  Content Blaming others  Delusions None reported or observed  Perception WDL  Hallucination None reported or observed  Judgment Impaired  Confusion None  Danger to Self  Current suicidal ideation? Denies  Agreement Not to Harm Self Yes  Description of Agreement Verbal  Danger to Others  Danger to Others None reported or observed

## 2022-09-27 NOTE — Progress Notes (Signed)
   09/27/22 0536  Sleep  Number of Hours 8.75
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
## 2022-09-27 NOTE — Group Note (Signed)
Recreation Therapy Group Note   Group Topic:Self-Esteem  Group Date: 09/27/2022 Start Time: 1000 End Time: 5621 Facilitators: Shey Yott-McCall, LRT,CTRS Location: [address] Day[address]   Goal Area(s) Addresses:  Patient will successfully identify positive attributes about themselves.  Patient will identify healthy ways to increase self-esteem. Patient will acknowledge benefit(s) of improved self-esteem.   Group Description:  Wellsite geologist.  LRT and patients discussed how the views of others impact how we feel about ourselves.  Patients also discussed the importance of having positive thoughts about one's self.  LRT then gave patients a blank outline of a picture frame.  Patients were to take the picture frame and create a picture of themselves using positive images and language. In addition, to the blank picture frames, patients were given markers to create their vision.  LRT also played music for patients as they worked on [employer]   Affect/Mood: N/A   Participation Level: Did not attend    Clinical Observations/Individualized Feedback:    Plan: Continue to engage patient in RT group sessions 2-3x/week.   Brittney Caraway-McCall, LRT,CTRS 09/27/2022 12:13 PM

## 2022-09-27 NOTE — Progress Notes (Signed)
   09/27/22 2000  Psych Admission Type (Psych Patients Only)  Admission Status Involuntary  Psychosocial Assessment  Patient Complaints None  Eye Contact Fair  Facial Expression Animated  Affect Appropriate to circumstance  Speech Logical/coherent  Interaction Assertive  Motor Activity Pacing;Fidgety  Appearance/Hygiene Improved  Behavior Characteristics Cooperative;Appropriate to situation  Mood Pleasant  Aggressive Behavior  Effect No apparent injury  Thought Process  Coherency WDL  Content WDL  Delusions None reported or observed  Perception WDL  Hallucination None reported or observed  Judgment Impaired  Confusion None  Danger to Self  Current suicidal ideation? Denies  Agreement Not to Harm Self Yes  Description of Agreement Verbally contracts for safety.  Danger to Others  Danger to Others None reported or observed   Patient alert and oriented. Patient presenting appropriate to circumstance and pleasant. Patient denies SI, HI, AVH, and pain. Patient states they are not experiencing any anxiety or depression symptoms at this time. Patient stated that she  vomited the previous shift after dinner, she states "its something I ate, but I feel better now. I took a shower and I want a snack if I can have one." Patient also reports "I feel better today, I think the trazodone I was taking made me too sleepy earlier today, but I feel better now." Patient states she would like to request providers "to put an order in for my adderall I take at home, I think my doctors name is Amy Sarita Haver". Scheduled risperdal administered, per MAR. Support and encouragement provided. Routine safety checks conducted every 15 minutes. Patient contracts for safety and remains safe on the unit.

## 2022-09-28 NOTE — Progress Notes (Signed)
   09/28/22 0542  Sleep  Number of Hours 7.75

## 2022-09-28 NOTE — Group Note (Signed)
Recreation Therapy Group Note   Group Topic:Team Building  Group Date: 09/28/2022 Start Time: 1000 End Time: 1025 Facilitators: Aubrie Lucien-McCall, LRT,CTRS Location: [address] Day[address]   Goal Area(s) Addresses:  Patient will effectively work with peer towards shared goal.  Patient will identify skills used to make activity successful.  Patient will identify how skills used during activity can be used to reach post d/c goals.   Group Description: Landing Pad. In teams of 3-5, patients were given 12 plastic drinking straws and an equal length of masking tape. Using the materials provided, patients were asked to build a landing pad to catch a golf ball dropped from approximately 5 feet in the air. All materials were required to be used by the team in their design. LRT facilitated post-activity discussion.   Affect/Mood: Anxious   Participation Level: None   Participation Quality: Independent   Behavior: Distracted and Disinterested   Speech/Thought Process: Distracted   Insight: Poor   Judgement: Poor   Modes of Intervention: STEM Activity   Patient Response to Interventions:  Disengaged   Education Outcome:  Acknowledges education and In group clarification offered    Clinical Observations/Individualized Feedback: Pt did not participate.  Pt couldn't sit still.  Pt was focused on having a snack.  Pt was distracted and disinterested the entire group.    Plan: Continue to engage patient in RT group sessions 2-3x/week.   Jervis Trapani-McCall, LRT,CTRS 09/28/2022 11:39 AM

## 2022-09-28 NOTE — Progress Notes (Signed)
Adult Psychoeducational Group Note  Date:  09/28/2022 Time:  8:48 PM  Group Topic/Focus:  Wrap-Up Group:   The focus of this group is to help patients review their daily goal of treatment and discuss progress on daily workbooks.  Participation Level:  Active  Participation Quality:  Appropriate  Affect:  Appropriate  Cognitive:  Appropriate  Insight: Appropriate  Engagement in Group:  Engaged  Modes of Intervention:  Discussion and Support  Additional Comments:   Pt attended and participated in the Wrap Up group. Pt rated her day 10/10. Pt reports making progress toward her goal of discharging from treatment. "I've been awake more, smiling, been calm and cooperative and following the rules". Pt reports using affirmations, smiling, walking and choosing not to be violent as effective coping skills. Pt was receptive to encouragement and verbal praise.  Ana Best 09/28/2022, 8:48 PM
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
## 2022-09-28 NOTE — Group Note (Signed)
Type of Therapy and Topic: Group Therapy: Gratitude  Participation: Active  Description of Group: The purpose behind this group is to get people thinking about things for which  they can be grateful. If continued over time, they might begin to spontaneously look for things and  situations for which to be grateful. Gratitude is related to a "wide variety of forms of wellbeing , whereas "negative attributions" can adversely affect relationships.  Several studies have shown that interventions to  increase gratitude can impact areas such as overall life satisfaction, decreased negative affect, increased happiness, the ability to provide emotional support to others, and decreased worrying.   Therapeutic Goals: Patient will learn activities that focus on gratitude in their daily lives. Patient will share gratitude in their daily lives. Patient will learn to develop healthy habits and positive thinking techniques. Patient will receive support and feedback from others  Therapeutic Modalities: Cognitive Behavioral Therapy Solution Focused Therapy Motivational Interviewing   Masson Nalepa, LCSW, LCAS Clincal Social Worker  [facility]   

## 2022-09-29 MED ORDER — RISPERIDONE 1 MG PO TBDP
1.0000 mg | ORAL_TABLET | Freq: Every day | ORAL | Status: DC
  Administered 2022-09-30 – 2022-10-04 (×5): 1 mg via ORAL
  Filled 2022-09-29 (×7): qty 1

## 2022-09-29 MED ORDER — RISPERIDONE 3 MG PO TBDP
3.0000 mg | ORAL_TABLET | Freq: Two times a day (BID) | ORAL | Status: DC | PRN
  Administered 2022-09-29: 3 mg via ORAL
  Filled 2022-09-29: qty 1

## 2022-09-29 MED ORDER — RISPERIDONE 2 MG PO TBDP
2.0000 mg | ORAL_TABLET | Freq: Every day | ORAL | Status: AC
  Administered 2022-09-29: 2 mg via ORAL
  Filled 2022-09-29: qty 1

## 2022-09-29 MED ORDER — RISPERIDONE 3 MG PO TBDP: 3.0000 mg | ORAL_TABLET | Freq: Every day | ORAL | Status: DC

## 2022-09-29 MED ORDER — HALOPERIDOL 5 MG PO TABS: 5.0000 mg | ORAL_TABLET | Freq: Three times a day (TID) | ORAL | Status: DC | PRN

## 2022-09-29 MED ORDER — RISPERIDONE 3 MG PO TBDP
3.0000 mg | ORAL_TABLET | Freq: Every day | ORAL | Status: DC
  Administered 2022-09-30 – 2022-10-03 (×4): 3 mg via ORAL
  Filled 2022-09-29 (×6): qty 1

## 2022-09-29 MED ORDER — BENZTROPINE MESYLATE 0.5 MG PO TABS
0.5000 mg | ORAL_TABLET | Freq: Two times a day (BID) | ORAL | Status: DC
  Administered 2022-09-29 – 2022-10-04 (×10): 0.5 mg via ORAL
  Filled 2022-09-29 (×16): qty 1

## 2022-09-29 MED ORDER — LORAZEPAM 1 MG PO TABS: 2.0000 mg | ORAL_TABLET | Freq: Three times a day (TID) | ORAL | Status: DC | PRN

## 2022-09-29 MED ORDER — DIPHENHYDRAMINE HCL 25 MG PO CAPS: 50.0000 mg | ORAL_CAPSULE | Freq: Three times a day (TID) | ORAL | Status: DC | PRN

## 2022-09-29 MED ORDER — LORAZEPAM 1 MG PO TABS
1.0000 mg | ORAL_TABLET | Freq: Two times a day (BID) | ORAL | Status: DC | PRN
  Administered 2022-09-29: 1 mg via ORAL
  Filled 2022-09-29: qty 1

## 2022-09-29 MED ORDER — RISPERIDONE 1 MG PO TBDP: 1.0000 mg | ORAL_TABLET | Freq: Every day | ORAL | Status: DC
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
## 2022-09-29 NOTE — Progress Notes (Signed)
   09/28/22 2030  Psych Admission Type (Psych Patients Only)  Admission Status Involuntary  Psychosocial Assessment  Patient Complaints Anxiety  Eye Contact Fair  Facial Expression Animated;Anxious  Affect Appropriate to circumstance;Anxious  Speech Logical/coherent  Teacher, music  Appearance/Hygiene Improved  Behavior Characteristics Cooperative  Mood Pleasant;Anxious  Thought Process  Coherency WDL  Content Blaming others;Paranoia  Delusions Paranoid;Persecutory  Perception WDL  Hallucination None reported or observed  Judgment Poor  Confusion None  Danger to Self  Current suicidal ideation? Denies  Agreement Not to Harm Self Yes  Description of Agreement Verbal Contract  Danger to Others  Danger to Others None reported or observed

## 2022-09-29 NOTE — Progress Notes (Signed)
   09/29/22 1700  Psych Admission Type (Psych Patients Only)  Admission Status Involuntary  Psychosocial Assessment  Patient Complaints Anxiety  Eye Contact Fair  Facial Expression Anxious;Worried  Affect Anxious  Furniture conservator/restorer  Appearance/Hygiene Improved  Behavior Characteristics Cooperative  Mood Pleasant;Anxious  Aggressive Behavior  Effect No apparent injury  Thought Process  Coherency WDL  Content Blaming others;Paranoia  Delusions Paranoid;Persecutory  Perception WDL  Hallucination None reported or observed  Judgment Poor  Confusion None  Danger to Self  Current suicidal ideation? Denies  Agreement Not to Harm Self Yes  Description of Agreement Verbal  Danger to Others  Danger to Others None reported or observed

## 2022-09-29 NOTE — Progress Notes (Signed)
Pt's mood remains labile with intermittent agitation and sadness. She's guarded and isolative to room majority of this shift in comparison to yesterday. Angry about not being able to meet d/c criteria. Verbal outburst at post provider's assessment, pt noted with verbal outburst, crying episode, un-consolable at the time "I should be fucking leaving now. I have done everything. I don't want him as my fucking doctor. I want to leave now". PRN Ativan 1 mg and Risperdal M-tab given both PO as ordered at 1323 with desired effect when reassessed at 1420. Off unit for meals with peers, returned without issues. Safety checks maintained. Continued support, reassurance and encouragement offered to pt this shift. Verbal education provided on current treatment regimen without evidence of learning.  Pt tolerates all PO intake well.

## 2022-09-29 NOTE — Progress Notes (Signed)
Adult Psychoeducational Group Note  Date:  09/29/2022 Time:  8:28 PM  Group Topic/Focus:  Wrap-Up Group:   The focus of this group is to help patients review their daily goal of treatment and discuss progress on daily workbooks.  Participation Level:  Did Not Attend  Participation Quality:   n/a  Affect:   n/a  Cognitive:   n/a  Insight: None  Engagement in Group:   n/a  Modes of Intervention:   n/a  Additional Comments:    Pt did not attend the Wrap Up group.  Edmund Hilda Sinclair Arrazola 09/29/2022, 8:28 PM

## 2022-09-29 NOTE — [facility] Counselor (Signed)
[facility] LCSW Progress Note   09/29/2022    12:01 PM  Ana Best   [id number]   Type of Contact and Topic:  Collateral   CSW received a letter to patient careteam from her mother.  Mother shared information about patient in letter and letter placed in patient chart for careteam to review.    Signed:  Anson [place] MSW, LCSW, LCAS 09/29/2022 12:01 PM

## 2022-09-29 NOTE — Group Note (Signed)
Recreation Therapy Group Note   Group Topic:Other  Group Date: 09/29/2022 Start Time: 0955 End Time: 1040 Facilitators: Thad Osoria-McCall, LRT,CTRS Location: [address] Dayroom   Goal Area(s) Addresses:  Patient will use appropriate dances as a way of self expression.   Patient will use dance as a way to release stress.   Group Description: Music Therapy.  LRT and patients talked about the importance of using music as a way to express one's self and the benefits.  Patients were allowed to pick songs that interested them and dance along to them.  Patients were to choose songs that were clean and appropriate.     Affect/Mood: Appropriate   Participation Level: Minimal   Participation Quality: Independent   Behavior: Appropriate   Speech/Thought Process: Focused   Insight: Good   Judgement: Good   Modes of Intervention: Music   Patient Response to Interventions:  Attentive   Education Outcome:  Acknowledges education and In group clarification offered    Clinical Observations/Individualized Feedback: Pt came late to group.  Pt expressed she hadn't fully woken up yet.  Pt didn't dance with the rest of the group but did do some stretching.  Pt was social with peers, cheered them on and requested a few songs.    Plan: Continue to engage patient in RT group sessions 2-3x/week.   Kiaan Overholser-McCall, LRT,CTRS 09/29/2022 12:10 PM

## 2022-09-29 NOTE — Progress Notes (Signed)
   09/29/22 0651  Sleep  Number of Hours 8

## 2022-09-30 ENCOUNTER — Encounter (HOSPITAL_COMMUNITY): Payer: Self-pay

## 2022-09-30 DIAGNOSIS — F1999 Other psychoactive substance use, unspecified with unspecified psychoactive substance-induced disorder: Principal | ICD-10-CM

## 2022-09-30 DIAGNOSIS — F1994 Other psychoactive substance use, unspecified with psychoactive substance-induced mood disorder: Secondary | ICD-10-CM | POA: Insufficient documentation

## 2022-09-30 DIAGNOSIS — F1910 Other psychoactive substance abuse, uncomplicated: Secondary | ICD-10-CM | POA: Insufficient documentation

## 2022-09-30 NOTE — Group Note (Signed)
LCSW Group Therapy Note  Group Date: 09/30/2022 Start Time: 1100 End Time: 1200   Type of Therapy and Topic:  Group Therapy - How To Cope with Nervousness about Discharge   Participation Level:  Active   Description of Group This process group involved identification of patients' feelings about discharge. Some of them are scheduled to be discharged soon, while others are new admissions, but each of them was asked to share thoughts and feelings surrounding discharge from the hospital. One common theme was that they are excited at the prospect of going home, while another was that many of them are apprehensive about sharing why they were hospitalized. Patients were given the opportunity to discuss these feelings with their peers in preparation for discharge.  Therapeutic Goals  Patient will identify their overall feelings about pending discharge. Patient will think about how they might proactively address issues that they believe will once again arise once they get home (i.e. with parents). Patients will participate in discussion about having hope for change.   Summary of Patient Progress:  Leory Plowman was very active throughout the session. Patient demonstrated good insight into the subject matter, and proved open to input from peers and feedback from CSW about Discharge feelings. Patient was respectful of peers and participated when being asked a question or word write on her worksheet.    Therapeutic Modalities Cognitive Behavioral Therapy   Beather Arbour 09/30/2022  11:46 AM

## 2022-09-30 NOTE — Group Note (Signed)
Recreation Therapy Group Note   Group Topic:Stress Management  Group Date: 09/30/2022 Start Time: 1000 End Time: 1025 Facilitators: Nkosi Cortright-McCall, LRT,CTRS Location: [address] Dayroom  Goal Area(s) Addresses:  Patient will identify positive stress management techniques. Patient will identify benefits of using stress management post d/c.   Group Description:  Meditation.  LRT and patients discussed the importance of meditation and the impact it has on you.  LRT played a meditation called Morning Meditation.  Patients were to listen and follow along as meditation played to get relaxed and focus on how meditation helps to relieve stress.  LRT shared with patients to explore meditation through apps, Youtube and other means available.      Affect/Mood: N/A   Participation Level: Did not attend    Clinical Observations/Individualized Feedback:     Plan: Continue to engage patient in RT group sessions 2-3x/week.   Ana Best, LRT,CTRS 09/30/2022 10:48 AM

## 2022-09-30 NOTE — BH IP Treatment Plan (Signed)
Interdisciplinary Treatment and Diagnostic Plan Update  09/30/2022 Time of Session: 0830 Ana Best MRN: [id number]  Principal Diagnosis: Bipolar affective disorder [facility])  Secondary Diagnoses: Principal Problem:   Bipolar affective disorder (HCC) Active Problems:   Bipolar I disorder, current or most recent episode manic, severe (HCC)   Polysubstance abuse (HCC)   Substance induced mood disorder (HCC)   Current Medications:  Current Facility-Administered Medications  Medication Dose Route Frequency Provider Last Rate Last Admin   acetaminophen (TYLENOL) tablet 650 mg  650 mg Oral Q6H PRN Jackelyn Poling, NP       alum & mag hydroxide-simeth (MAALOX/MYLANTA) 200-200-20 MG/5ML suspension 30 mL  30 mL Oral Q4H PRN Nira Conn A, NP       benztropine (COGENTIN) tablet 0.5 mg  0.5 mg Oral Q12H Massengill, Harrold Donath, MD   0.5 mg at 09/30/22 0816   haloperidol lactate (HALDOL) injection 5 mg  5 mg Intramuscular Q6H PRN Park Pope, MD       And   benztropine mesylate (COGENTIN) injection 1 mg  1 mg Intramuscular Q6H PRN Park Pope, MD       And   LORazepam (ATIVAN) injection 1 mg  1 mg Intramuscular Q6H PRN Park Pope, MD       haloperidol (HALDOL) tablet 5 mg  5 mg Oral TID PRN Phineas Inches, MD       And   LORazepam (ATIVAN) tablet 2 mg  2 mg Oral TID PRN Phineas Inches, MD       And   diphenhydrAMINE (BENADRYL) capsule 50 mg  50 mg Oral TID PRN Massengill, Harrold Donath, MD       hydrOXYzine (ATARAX) tablet 25 mg  25 mg Oral TID PRN Park Pope, MD   25 mg at 09/28/22 2038   LORazepam (ATIVAN) tablet 1 mg  1 mg Oral BID PRN Massengill, Harrold Donath, MD       magnesium hydroxide (MILK OF MAGNESIA) suspension 30 mL  30 mL Oral Daily PRN Nira Conn A, NP       risperiDONE (RISPERDAL M-TABS) disintegrating tablet 1 mg  1 mg Oral Daily Massengill, Nathan, MD   1 mg at 09/30/22 0816   risperiDONE (RISPERDAL M-TABS) disintegrating tablet 3 mg  3 mg Oral QHS Massengill, Nathan, MD       PTA  Medications: No medications prior to admission.    Patient Stressors:    Patient Strengths:    Treatment Modalities: Medication Management, Group therapy, Case management,  1 to 1 session with clinician, Psychoeducation, Recreational therapy.   Physician Treatment Plan for Primary Diagnosis: Bipolar affective disorder (HCC) Long Term Goal(s):     Short Term Goals:    Medication Management: Evaluate patient's response, side effects, and tolerance of medication regimen.  Therapeutic Interventions: 1 to 1 sessions, Unit Group sessions and Medication administration.  Evaluation of Outcomes: Progressing  Physician Treatment Plan for Secondary Diagnosis: Principal Problem:   Bipolar affective disorder (HCC) Active Problems:   Bipolar I disorder, current or most recent episode manic, severe (HCC)   Polysubstance abuse (HCC)   Substance induced mood disorder (HCC)  Long Term Goal(s):     Short Term Goals:       Medication Management: Evaluate patient's response, side effects, and tolerance of medication regimen.  Therapeutic Interventions: 1 to 1 sessions, Unit Group sessions and Medication administration.  Evaluation of Outcomes: Progressing   RN Treatment Plan for Primary Diagnosis: Bipolar affective disorder (HCC) Long Term Goal(s): Knowledge of disease and therapeutic regimen  to maintain health will improve  Short Term Goals: Ability to remain free from injury will improve, Ability to verbalize frustration and anger appropriately will improve, Ability to demonstrate self-control, Ability to participate in decision making will improve, Ability to verbalize feelings will improve, Ability to disclose and discuss suicidal ideas, Ability to identify and develop effective coping behaviors will improve, and Compliance with prescribed medications will improve  Medication Management: RN will administer medications as ordered by provider, will assess and evaluate patient's response  and provide education to patient for prescribed medication. RN will report any adverse and/or side effects to prescribing provider.  Therapeutic Interventions: 1 on 1 counseling sessions, Psychoeducation, Medication administration, Evaluate responses to treatment, Monitor vital signs and CBGs as ordered, Perform/monitor CIWA, COWS, AIMS and Fall Risk screenings as ordered, Perform wound care treatments as ordered.  Evaluation of Outcomes: Progressing   LCSW Treatment Plan for Primary Diagnosis: Bipolar affective disorder (HCC) Long Term Goal(s): Safe transition to appropriate next level of care at discharge, Engage patient in therapeutic group addressing interpersonal concerns.  Short Term Goals: Engage patient in aftercare planning with referrals and resources, Increase social support, Increase ability to appropriately verbalize feelings, Increase emotional regulation, Facilitate acceptance of mental health diagnosis and concerns, Facilitate patient progression through stages of change regarding substance use diagnoses and concerns, Identify triggers associated with mental health/substance abuse issues, and Increase skills for wellness and recovery  Therapeutic Interventions: Assess for all discharge needs, 1 to 1 time with Social worker, Explore available resources and support systems, Assess for adequacy in community support network, Educate family and significant other(s) on suicide prevention, Complete Psychosocial Assessment, Interpersonal group therapy.  Evaluation of Outcomes: Progressing   Progress in Treatment: Attending groups: Yes. Participating in groups: Yes. Taking medication as prescribed: Yes. Toleration medication: Yes. Family/Significant other contact made: No, will contact:  Patient declined  Patient understands diagnosis: No. Discussing patient identified problems/goals with staff: Yes. Medical problems stabilized or resolved: Yes. Denies suicidal/homicidal ideation:  No. Issues/concerns per patient self-inventory: Yes. Other: none  New problem(s) identified: No, Describe:  none  New Short Term/Long Term Goal(s): Patient to work towards detox, elimination of symptoms of psychosis, medication management for mood stabilization; elimination of SI thoughts; development of comprehensive mental wellness/sobriety plan.  Patient Goals:  No additional goals identified at this time. Patient to continue to work towards original goals identified in initial treatment team meeting. CSW will remain available to patient should they voice additional treatment goals.   Discharge Plan or Barriers: No psychosocial barriers identified at this time, patient to return to place of residence when appropriate for discharge.   Reason for Continuation of Hospitalization: Medication stabilization Other; describe mood stabilization   Estimated Length of Stay: 1-7 days     Scribe for Treatment Team: Almedia Balls 09/30/2022 9:16 AM
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
## 2022-09-30 NOTE — [facility] Group Notes (Signed)
[facility] Group Notes:  (Nursing/MHT/Case Management/Adjunct)  Date:  09/30/2022  Time:  9:45 AM  Type of Therapy:   Orientation group  Participation Level:  Active  Participation Quality:  Appropriate and Attentive  Affect:  Angry and Irritable  Cognitive:  Alert and Oriented  Insight:  Lacking  Engagement in Group:  Engaged  Modes of Intervention:  Discussion  Summary of Progress/Problems: Discussed goal and expectations for the day.  Audrie Lia Edessa Jakubowicz 09/30/2022, 9:45 AM

## 2022-09-30 NOTE — Progress Notes (Signed)
Adult Psychoeducational Group Note  Date:  09/30/2022 Time:  10:28 PM  Group Topic/Focus:  Wrap-Up Group:   The focus of this group is to help patients review their daily goal of treatment and discuss progress on daily workbooks.  Participation Level:  Active  Participation Quality:  Appropriate  Affect:  Appropriate  Cognitive:  Appropriate  Insight: Appropriate  Engagement in Group:  Developing/Improving  Modes of Intervention:  Discussion  Additional Comments:  Pt stated her goal for today was to focus on her treatment plan. Pt stated she accomplished her goal today. Pt stated she talked with her doctor and with her social worker about her care today. Pt rated her overall day a 10. Pt stated she was able to contact her children today which improved her overall day. Pt stated she felt better about herself tonight. Pt stated she was able to attend all meals today. Pt stated she took all medications provided today. Pt stated her appetite was pretty good today. Pt rated her sleep last night was pretty good. Pt stated the goal tonight was to get some rest. Pt stated she had no physical pain tonight. Pt deny visual hallucinations and auditory issues tonight. Pt denies thoughts of harming herself or others. Pt stated she would alert staff if anything changed.  Felipa Furnace 09/30/2022, 10:28 PM

## 2022-09-30 NOTE — [facility] Group Notes (Signed)
Spirituality Group facilitated by Chaplain Katy Brynlyn Dade, [facility]   Group focused on topic of strength. Group members reflected on what thoughts and feelings emerge when they hear this topic. They then engaged in facilitated dialog around how strength is present in their lives. This dialog focused on representing what strength had been to them in their lives (images and patterns given) and what they saw as helpful in their life now (what they needed / wanted).  Activity drew on narrative framework.   Patient Progress: Did not attend.      

## 2022-10-01 DIAGNOSIS — F1999 Other psychoactive substance use, unspecified with unspecified psychoactive substance-induced disorder: Secondary | ICD-10-CM

## 2022-10-01 NOTE — [facility] Group Notes (Signed)
Goals Group 10/01/22   Group Focus: affirmation, clarity of thought, and goals/reality orientation Treatment Modality:  Psychoeducation Interventions utilized were assignment, group exercise, and support Purpose: To be able to understand and verbalize the reason for their admission to the hospital. To understand that the medication helps with their chemical imbalance but they also need to work on their choices in life. To be challenged to develop a list of 30 positives about themselves. Also introduce the concept that "feelings" are not reality.  Participation Level:  Active  Participation Quality:  Appropriate  Affect:  Appropriate  Cognitive:  Appropriate  Insight:  Improving  Engagement in Group:  Engaged  Additional Comments:  Rates her energy at a 10/10. Participated in the group.  Dione Housekeeper

## 2022-10-01 NOTE — Plan of Care (Signed)

## 2022-10-01 NOTE — Group Note (Signed)
[facility] LCSW Group Therapy Note  Date/Time:    10/01/2022   Type of Therapy and Topic:  Group Therapy:  Practicing Kindness to Self  Participation Level:  Active   Description of Group:  The focus of this group is to examine human tendencies to be hyper-critical of self.  Coping skills to deal with this were discussed.  Multiple exercises were led in a shortened version of several of these coping skills.  Therapeutic Goals Share current healthy and unhealthy coping skills used when feeling bad about some aspect of self Practice multiple coping skills including Questioning whether they feel guilt ("I did something bad" "I made a mistake" "I did something stupid") or shame ("I am bad" "I am a mistake" "I am stupid") and whether this feeling is based in fact. 5 senses mindfulness Camera - Zoom In and Out  (Zooming in highlights our flaws and Zooming Out helps [place] to see ourselves more fully with flaws and assets) Grounding  [employer], Gratitude, Chiropractor, Engineer, water) Encourage to do the needed work to Nurse, mental health, focusing on how medication is part of the solution to emotional and mental problems, while coping skills are necessary to actually change behavior.  Summary of Patient Progress: During group, patient expressed that something they struggle with is not cutting herself some slack like she would anybody else.  Patient stated that she will be very busy doing many different chores but when she gets tired and needs to rest will think of herself as being lazy.  She embraced the idea of the coping skills shared..   Therapeutic Modalities Processing Lecture Activities   Ambrose Mantle, LCSW 10/01/2022, 5:50 PM

## 2022-10-01 NOTE — Progress Notes (Signed)
   10/01/22 1800  Psych Admission Type (Psych Patients Only)  Admission Status Involuntary  Psychosocial Assessment  Patient Complaints Anxiety  Eye Contact Fair  Facial Expression Anxious  Affect Anxious  Speech Logical/coherent  Interaction Assertive  Motor Activity Fidgety;Pacing  Appearance/Hygiene Improved  Behavior Characteristics Cooperative  Mood Anxious  Aggressive Behavior  Effect No apparent injury  Thought Process  Coherency WDL  Content Blaming others  Delusions Paranoid;Persecutory  Perception WDL  Hallucination None reported or observed  Judgment Poor  Confusion None  Danger to Self  Current suicidal ideation? Denies  Agreement Not to Harm Self Yes  Description of Agreement verbal  Danger to Others  Danger to Others None reported or observed
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
## 2022-10-01 NOTE — Progress Notes (Addendum)
Adult Psychoeducational Group Note  Date:  10/01/2022 Time:  9:45 PM  Group Topic/Focus:  Wrap-Up Group:   The focus of this group is to help patients review their daily goal of treatment and discuss progress on daily workbooks.  Participation Level:  Active  Participation Quality:  Appropriate, Attentive, Sharing, and Supportive  Affect:  Appropriate  Cognitive:  Appropriate  Insight: Appropriate  Engagement in Group:  Engaged  Modes of Intervention:  Discussion, Rapport Building, and Support  Additional Comments:   Pt attended and participated in the Wrap Up group. Pt denied SI/HI./AVH and pain. Pt rated her day 10/10. Pt reports making some progress toward improved anger management and discharging from treatment. "Im doing everything I supposed to and not reacting when upset". Pt receptive to verbal praise. Pt identified humor, skipping, exercise, attending groups and compliance with treatment and medication as effective coping skills that aided in her progress and improved mood and coping. Pt attentive during the brief review of coping skills and agreed to start daily reflection and journal writing. Writer provided Pt with a journal.  Edmund Hilda Jordon Kristiansen 10/01/2022, 9:45 PM

## 2022-10-02 NOTE — Progress Notes (Signed)
Safety round complete. Patient located in bedroom, asleep, lying on her right side. Q15 mins checks will be continued.

## 2022-10-02 NOTE — Group Note (Signed)
[facility] LCSW Group Therapy Note  Date/Time:  10/02/2022  10:00AM-11:00AM  Type of Therapy and Topic:  Group Therapy:  Obstacles at Discharge  Participation Level:  Active   Description of Group: In this process group, members discussed their anticipated obstacles to wellness when they discharge.  Patients were asked to share what their goal is at discharge.  We talked in detail about the importance of setting boundaries in our lives and how to set such boundaries.  It was discussed how sometimes we set boundaries with others and sometimes we set those boundaries with ourselves.  We then listened to different songs that dealt with addiction, depression, and anxiety.  The group discussed how they could relate to each song and how each could help them to stay focused on their wellness.    Therapeutic Goals: Patients will determine one major goal that they wish to pursue at discharge, in terms of remaining well Patients will think about and acknowledge the obstacles they think they will face at hospital discharge Patients will be able to realize that they are not alone and others are actually facing similar obstacles Patients will be introduced to ways in which to set boundaries in a healthy, productive fashion Patients will identify how music can help or harm their recovery efforts Patients will explore the use of music as a coping skill  Summary of Patient Progress:  At the beginning of group, patient shared that her goal at discharge is to not overreact to people around her, while her obstacles at discharge will likely be negative people.  Her reaction to the music included a lot of positive comments about how helpful the songs could be to her and others as they seek recovery and to remain in a positive place.  Therapeutic Modalities: Activity Processing   Ambrose Mantle, LCSW

## 2022-10-02 NOTE — Progress Notes (Signed)
D- Patient alert and oriented. Patient affect/mood.reported as "great".  Denies SI, HI, AVH, and pain. Patient Goal: " to get ready for discharge either tomorrow or Tuesday, I feel like I learned a lot since I have been here and I came a ways compared to me when I first got here".  A- Scheduled medications administered to patient, per MD orders. Support and encouragement provided.  Routine safety checks conducted every 15 minutes.  Patient informed to notify staff with problems or concerns. R- No adverse drug reactions noted. Patient contracts for safety at this time. Patient compliant with medications and treatment plan. Patient receptive, calm, and cooperative. Patient interacts well with others on the unit.  Patient remains safe at this time.    Patient seen walking up and down the hallway, asked the patient could I walk with her. Patient was pleasant when I approached, answered questions logically, and was receptive of information that was discussed. Patient states, "I have a lot of energy and I am trying to walk it off, also I have been eating a lot since I have been here so I am trying to work that off as well." Patient laughs and then proceeds to walk. Patient attended night group, ate a snack, took her night medications, and then stayed in the day room to watch television before going into her room for rest.

## 2022-10-02 NOTE — Progress Notes (Signed)
Patient denies SI, HI and AVH this shift. Patient has been compliant with medications and participated in unit activities. Patient has had no incident of behavioral dyscontrol.   Assess patient for safety, offer medications as prescribed, engage patient in 1:1 staff talks.   Patient able to contract for safety. Continue to monitor as planned.
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
## 2022-10-02 NOTE — Plan of Care (Signed)
  Problem: Health Behavior/Discharge Planning: Goal: Compliance with treatment plan for underlying cause of condition will improve Outcome: Progressing   Problem: Physical Regulation: Goal: Ability to maintain clinical measurements within normal limits will improve Outcome: Progressing   Problem: Safety: Goal: Periods of time without injury will increase Outcome: Progressing   Problem: Education: Goal: Knowledge of General Education information will improve Description: Including pain rating scale, medication(s)/side effects and non-pharmacologic comfort measures Outcome: Progressing  Patient is  compliant with medication and treatment plan. Denies SI/HI/A/VH and verbally contracted for safety. Support and encouragement provided. Q 15 minutes safety checks ongoing Patient remains safe.

## 2022-10-02 NOTE — Progress Notes (Signed)
Adult Psychoeducational Group Note  Date:  10/02/2022 Time:  9:45 PM  Group Topic/Focus:  Wrap-Up Group:   The focus of this group is to help patients review their daily goal of treatment and discuss progress on daily workbooks.  Participation Level:  Active  Participation Quality:  Appropriate, Attentive, and Sharing  Affect:  Appropriate  Cognitive:  Appropriate  Insight: Appropriate  Engagement in Group:  Engaged  Modes of Intervention:  Discussion, Socialization, and Support  Additional Comments:   Pt attended and participated in the Wrap Up group. Pt denied SI/HI/AVH and pain. Pt rated her day a 10/10. Pt reported progress toward her goal of discharge from treatment. "I'm getting discharged Monday or Tuesday this week". Pt receptive of verbal praise. Pt expressed gratitude for the support provided by this Clinical research associate and [employer]m. Pt reported writing in her journal,. practicing positive affirmations, daily prayer, reading scriptures and self-care as effective coping strategies that aided in her progression.  Ana Best 10/02/2022, 9:45 PM

## 2022-10-02 NOTE — [facility] Group Notes (Signed)
Adult Psychoeducational Group Note Date:  10/02/2022 Time:  0900-1000 Group Topic/Focus: PROGRESSIVE RELAXATION. A group where deep breathing is taught and tensing and relaxation muscle groups is used. Imagery is used as well.  Pts are asked to imagine 3 pillars that hold them up when they are not able to hold themselves up and to share that with the group.   Participation Level:  Active  Participation Quality:  Appropriate  Affect:  Appropriate  Cognitive:  Approprate  Insight: Improving  Engagement in Group:  Engaged  Modes of Intervention:  deep breathing, Imagery. Discussion  Additional Comments:  Rates her energy at a 8/10. States, salvation her family and her friends hold her up.   : Ana Best

## 2022-10-03 MED ORDER — NICOTINE POLACRILEX 2 MG MT GUM
2.0000 mg | CHEWING_GUM | OROMUCOSAL | Status: DC | PRN
  Administered 2022-10-03 – 2022-10-04 (×3): 2 mg via ORAL
  Filled 2022-10-03 (×2): qty 1

## 2022-10-03 NOTE — ED Notes (Signed)
   10/02/22 2200  Psych Admission Type (Psych Patients Only)  Admission Status Involuntary  Psychosocial Assessment  Patient Complaints None  Eye Contact Fair  Facial Expression Animated  Affect Appropriate to circumstance  Speech Logical/coherent  Interaction Assertive  Motor Activity Fidgety  Appearance/Hygiene Unremarkable  Behavior Characteristics Cooperative;Restless  Mood Pleasant  Thought Process  Coherency WDL  Content WDL  Delusions None reported or observed  Perception WDL  Hallucination None reported or observed  Judgment Impaired  Confusion None  Danger to Self  Current suicidal ideation? Denies  Agreement Not to Harm Self Yes  Description of Agreement Verbal  Danger to Others  Danger to Others None reported or observed

## 2022-10-03 NOTE — Progress Notes (Signed)
Sleep: 8 hours  

## 2022-10-03 NOTE — Progress Notes (Signed)
   10/03/22 1037  Psych Admission Type (Psych Patients Only)  Admission Status Involuntary  Psychosocial Assessment  Patient Complaints None  Eye Contact Fair  Facial Expression Flat  Affect Appropriate to circumstance  Speech Logical/coherent  Interaction Assertive  Motor Activity Other (Comment) (WNL)  Appearance/Hygiene Unremarkable  Behavior Characteristics Cooperative  Mood Euthymic;Pleasant  Thought Process  Coherency WDL  Content WDL  Delusions None reported or observed  Perception WDL  Hallucination None reported or observed  Judgment Impaired  Confusion None  Danger to Self  Current suicidal ideation? Denies  Agreement Not to Harm Self Yes  Description of Agreement verbal  Danger to Others  Danger to Others None reported or observed

## 2022-10-03 NOTE — Plan of Care (Signed)
  Problem: Education: Goal: Emotional status will improve Outcome: Progressing   Problem: Activity: Goal: Interest or engagement in activities will improve Outcome: Progressing   Problem: Coping: Goal: Ability to demonstrate self-control will improve Outcome: Progressing   Problem: Safety: Goal: Periods of time without injury will increase Outcome: Progressing   

## 2022-10-03 NOTE — Progress Notes (Signed)
Safety round complete. Patient located in bedroom, asleep, lying supine position. Q15 mins checks will be continued.   

## 2022-10-03 NOTE — Progress Notes (Signed)
Safety round complete. Patient located in day room. Q15 mins checks will be continued.   
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
## 2022-10-03 NOTE — Progress Notes (Signed)
     10/03/22 2110  Psych Admission Type (Psych Patients Only)  Admission Status Involuntary  Psychosocial Assessment  Patient Complaints None  Eye Contact Fair  Facial Expression Flat  Affect Appropriate to circumstance  Speech Logical/coherent  Interaction Assertive  Motor Activity Other (Comment) (WDL)  Appearance/Hygiene Unremarkable  Behavior Characteristics Cooperative  Mood Pleasant;Euthymic  Thought Process  Coherency WDL  Content WDL  Delusions None reported or observed  Perception WDL  Hallucination None reported or observed  Judgment Impaired  Confusion None  Danger to Self  Current suicidal ideation? Denies  Agreement Not to Harm Self Yes  Description of Agreement verbal  Danger to Others  Danger to Others None reported or observed

## 2022-10-03 NOTE — Plan of Care (Signed)
  Problem: Education: Goal: Emotional status will improve Outcome: Progressing Goal: Mental status will improve Outcome: Progressing   Problem: Activity: Goal: Interest or engagement in activities will improve Outcome: Progressing Goal: Sleeping patterns will improve Outcome: Progressing   Problem: Coping: Goal: Ability to demonstrate self-control will improve Outcome: Progressing   Problem: Health Behavior/Discharge Planning: Goal: Compliance with treatment plan for underlying cause of condition will improve Outcome: Progressing

## 2022-10-03 NOTE — Group Note (Signed)
LCSW Group Therapy Note   Group Date: 10/03/2022 Start Time: 1300 End Time: 1400  Type of Therapy and Topic:  Group Therapy - Healthy vs Unhealthy Coping Skills  Participation Level:  Active   Description of Group The focus of this group was to determine what unhealthy coping techniques typically are used by group members and what healthy coping techniques would be helpful in coping with various problems. Patients were guided in becoming aware of the differences between healthy and unhealthy coping techniques. Patients were asked to identify 2-3 healthy coping skills they would like to learn to use more effectively.  Therapeutic Goals Patients learned that coping is what human beings do all day long to deal with various situations in their lives Patients defined and discussed healthy vs unhealthy coping techniques Patients identified their preferred coping techniques and identified whether these were healthy or unhealthy Patients determined 2-3 healthy coping skills they would like to become more familiar with and use more often. Patients provided support and ideas to each other   Summary of Patient Progress:  The Pt attended group and remained there the entire time.  The Pt accepted all worksheets and was able to demonstrate understanding of the topic being discussed.  The Pt was able to engage in discussion with their peers and was appropriate with their peers.    Therapeutic Modalities Cognitive Behavioral Therapy Motivational Interviewing  Ana Best, [place] 10/03/2022  1:46 PM

## 2022-10-04 ENCOUNTER — Encounter (HOSPITAL_COMMUNITY): Payer: Self-pay | Admitting: Psychiatry

## 2022-10-04 MED ORDER — BENZTROPINE MESYLATE 0.5 MG PO TABS: 0.5000 mg | ORAL_TABLET | Freq: Two times a day (BID) | ORAL | 0 refills | Status: DC

## 2022-10-04 MED ORDER — RISPERIDONE 1 MG PO TBDP: 1.0000 mg | ORAL_TABLET | Freq: Every day | ORAL | 0 refills | Status: DC

## 2022-10-04 MED ORDER — HYDROXYZINE HCL 25 MG PO TABS: 25.0000 mg | ORAL_TABLET | Freq: Three times a day (TID) | ORAL | 0 refills | Status: DC | PRN

## 2022-10-04 MED ORDER — RISPERIDONE 3 MG PO TBDP: 3.0000 mg | ORAL_TABLET | Freq: Every day | ORAL | 0 refills | Status: DC

## 2022-10-04 MED ORDER — NICOTINE POLACRILEX 2 MG MT GUM: 2.0000 mg | CHEWING_GUM | OROMUCOSAL | 0 refills | Status: DC | PRN

## 2022-10-04 NOTE — [facility] Group Notes (Signed)
Adult Psychoeducational Group Note  Date:  10/04/2022 Time:  10:40 AM  Group Topic/Focus:  Goals Group:   The focus of this group is to help patients establish daily goals to achieve during treatment and discuss how the patient can incorporate goal setting into their daily lives to aide in recovery.  Participation Level:  Active  Participation Quality:  Appropriate and Attentive  Affect:  Appropriate  Cognitive:  Appropriate  Insight: Appropriate  Engagement in Group:  Engaged  Modes of Intervention:  Discussion  Additional Comments:  Patient was attentive. Shared goals and plans of achievement during group  Burlene Arnt 10/04/2022, 10:40 AM
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
## 2022-10-04 NOTE — Plan of Care (Signed)
  Problem: Education: Goal: Knowledge of [facility] Education information/materials will improve Outcome: Completed/Met Goal: Emotional status will improve Outcome: Completed/Met Goal: Mental status will improve Outcome: Completed/Met Goal: Verbalization of understanding the information provided will improve Outcome: Completed/Met   Problem: Activity: Goal: Interest or engagement in activities will improve Outcome: Completed/Met Goal: Sleeping patterns will improve Outcome: Completed/Met

## 2022-10-04 NOTE — Progress Notes (Signed)
   Administered PRM Nicotine gum per MAR per pt request.

## 2022-10-04 NOTE — Progress Notes (Signed)
Kieth Brightly  D/C'd Home per MD order.  Discussed with the patient and all questions fully answered.   An After Visit Summary was printed and given to the patient. Patient received prescription.  D/c education completed with patient including follow up instructions, medication list, d/c activities limitations if indicated, with other d/c instructions as indicated by MD - patient able to verbalize understanding, all questions fully answered.   Patient instructed to return to ED, call 911, or call MD for any changes in condition.   Patient escorted to the main entrance, and D/C home via private auto.  Melvenia Needles 10/04/2022 3:33 PM

## 2022-10-04 NOTE — Discharge Instructions (Signed)
-  Follow-up with your outpatient psychiatric provider -instructions on appointment date, time, and address (location) are provided to you in discharge paperwork. -It is our recommendation that you do not take adderall or other stimulants, as it is thought that this worsens your paranoia and anxiety.   -Take your psychiatric medications as prescribed at discharge - instructions are provided to you in the discharge paperwork  -Follow-up with outpatient primary care doctor and other specialists -for management of preventative medicine and any chronic medical disease.  -Recommend abstinence from alcohol, tobacco, and other illicit drug use at discharge.   -If your psychiatric symptoms recur, worsen, or if you have side effects to your psychiatric medications, call your outpatient psychiatric provider, 911, 988 or go to the nearest emergency department.  -If suicidal thoughts occur or paranoid thinking worsens, call your outpatient psychiatric provider, 911, 988 or go to the nearest emergency department.

## 2022-10-04 NOTE — Progress Notes (Signed)
  [name] Case Management Discharge Plan :  Will you be returning to the same living situation after discharge:  Yes,  home At discharge, do you have transportation home?: Yes,  hospital will provide taxi service Do you have the ability to pay for your medications: Yes,  insurance  Release of information consent forms completed and in the chart;  Patient's signature needed at discharge.  Patient to Follow up at:  Follow-up Information     [facility] Go on 10/24/2022.   Why: You have an appointment with [name] for medication management services on 10/24/22 at 10:00 am.  This appointment will be held in person. Contact information: [address] [place] [address] [phone]         [facility]. Go on 10/11/2022.   Specialty: Behavioral Health Why: You have an appointment for therapy services with [name] on 10/11/22 @ 9:30 a.m.  This appointment will be held in person. Contact information: [address] [place] [address] [phone]                Next level of care provider has access to [facility] Link:no  Safety Planning and Suicide Prevention discussed: Yes,  with patient, patient declined consents to speak with social supports     Has patient been referred to the Quitline?: Patient refused referral  Patient has been referred for addiction treatment: N/A, patient denies substance use.   Alcohol Level    Component Value Date/Time   [facility] <10 09/20/2022 2141   Tobacco Use: Medium Risk (09/23/2022)   Patient History    Smoking Tobacco Use: Former    Smokeless Tobacco Use: Unknown    Passive Exposure: Not on file   Social History   Substance and Sexual Activity  Drug Use Not Currently   Comment: Pt denies substance use      Ana Best E Ana Hinote, LCSW 10/04/2022, 9:36 AM

## 2022-10-05 NOTE — BHH Group Notes (Signed)
Spiritual care group on grief and loss facilitated by chaplain Katy Loreta Blouch, [facility]   Group Goal:   Support / Education around grief and loss   Members engage in facilitated group support and psycho-social education.   Group Description:   Following introductions and group rules, group members engaged in facilitated group dialog and support around topic of loss, with particular support around experiences of loss in their lives. Group Identified types of loss (relationships / self / things) and identified patterns, circumstances, and changes that precipitate losses. Reflected on thoughts / feelings around loss, normalized grief responses, and recognized variety in grief experience. Group noted Worden's four tasks of grief in discussion.   Group drew on Adlerian / Rogerian, narrative, MI,   Patient Progress: Did not attend.  

## 2022-10-11 ENCOUNTER — Ambulatory Visit (HOSPITAL_COMMUNITY): Payer: Medicaid Other | Admitting: Licensed Clinical Social Worker

## 2023-04-25 ENCOUNTER — Encounter (HOSPITAL_COMMUNITY): Payer: Self-pay | Admitting: Emergency Medicine

## 2023-04-25 ENCOUNTER — Other Ambulatory Visit: Payer: Self-pay

## 2023-04-25 ENCOUNTER — Ambulatory Visit (HOSPITAL_COMMUNITY)
Admission: EM | Admit: 2023-04-25 | Discharge: 2023-04-25 | Disposition: A | Discharge status: Home / Self Care | Payer: Medicaid Other | Attending: Physician Assistant | Admitting: Physician Assistant

## 2023-04-25 DIAGNOSIS — H6691 Otitis media, unspecified, right ear: Secondary | ICD-10-CM | POA: Diagnosis not present

## 2023-04-25 DIAGNOSIS — W57XXXA Bitten or stung by nonvenomous insect and other nonvenomous arthropods, initial encounter: Secondary | ICD-10-CM

## 2023-04-25 MED ORDER — PERMETHRIN 5 % EX CREA: 1.0000 | TOPICAL_CREAM | Freq: Once | CUTANEOUS | 1 refills | Status: AC

## 2023-04-25 MED ORDER — AMOXICILLIN-POT CLAVULANATE 875-125 MG PO TABS: 1.0000 | ORAL_TABLET | Freq: Two times a day (BID) | ORAL | 0 refills | Status: DC

## 2023-04-25 MED ORDER — HYDROXYZINE PAMOATE 100 MG PO CAPS: 100.0000 mg | ORAL_CAPSULE | Freq: Three times a day (TID) | ORAL | 0 refills | Status: DC | PRN

## 2023-04-25 NOTE — ED Provider Notes (Signed)
[facility]    CSN: [id number] Arrival date & time: 04/25/23  1746      History   Chief Complaint Chief Complaint  Patient presents with   URI    HPI Ana Best is a 37 y.o. female.   Pt complains of a rash and itching.  Pt also complains of sinus pressure and ear pain.  Pt reports itching.  Pt feels like something is biting her.    The history is provided by the patient. No language interpreter was used.  URI Presenting symptoms: congestion   Severity:  Moderate Onset quality:  Gradual Progression:  Worsening Chronicity:  New Relieved by:  Nothing Worsened by:  Nothing   Past Medical History:  Diagnosis Date   Anxiety    Bipolar 1 disorder (HCC)    Depression    Hypertension     Patient Active Problem List   Diagnosis Date Noted   Polysubstance abuse (HCC) 09/30/2022   Substance induced mood disorder (HCC) 09/30/2022   Psychoactive substance-induced disorder (HCC) 09/30/2022   Bipolar affective disorder (HCC) 09/23/2022   Unspecified mood (affective) disorder (HCC) 09/21/2022   Bipolar I disorder, current or most recent episode manic, severe (HCC) 10/15/2021   Moderate cannabis use disorder (HCC) 10/09/2021   Schizoaffective disorder (HCC) 10/08/2021   Mild episode of recurrent major depressive disorder (HCC) 08/03/2020   Generalized anxiety disorder 07/15/2020   Recurrent major depressive disorder, in partial remission (HCC) 06/09/2020   Amphetamine dependence (HCC)    Psychoactive substance-induced psychosis (HCC)    MDD (major depressive disorder) 05/25/2020   Panic 12/10/2018   Normal delivery 10/09/2014   Difficulty controlling anger 06/01/2014   Nicotine addiction 01/02/2013   Attention deficit hyperactivity disorder (ADHD), 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 Authorizing Provider  amoxicillin-clavulanate (AUGMENTIN) 875-125 MG tablet Take 1 tablet by mouth 2 (two) times daily. 04/25/23  Yes Cheron Schaumann K, PA-C  hydrOXYzine (VISTARIL) 100 MG capsule Take 1 capsule (100 mg total) by mouth 3 (three) times daily as needed for itching. 04/25/23  Yes Cheron Schaumann K, PA-C  permethrin (ELIMITE) 5 % cream Apply 1 Application topically once for 1 dose. 04/25/23 04/25/23 Yes Elson Areas, PA-C  benztropine (COGENTIN) 0.5 MG tablet Take 1 tablet (0.5 mg total) by mouth every 12 (twelve) hours. Patient not taking: Reported on 04/25/2023 10/04/22 11/03/22  Phineas Inches, MD  buPROPion (WELLBUTRIN XL) 150 MG 24 hr tablet Take 1 tablet (150 mg total) by mouth daily. Depression Patient not taking: Reported on 04/25/2023 10/16/21   Starleen Blue, NP  cariprazine (VRAYLAR) 1.5 MG capsule Take 1 capsule (1.5 mg total) by mouth daily. Bipolar Disorder Patient not taking: Reported on 04/25/2023 10/16/21  Starleen Blue, NP  hydrOXYzine (ATARAX) 25 MG tablet Take 1 tablet (25 mg total) by mouth 3 (three) times daily as needed for anxiety. Patient not taking: Reported on 04/25/2023 10/04/22   Phineas Inches, MD  hydrOXYzine (ATARAX/VISTARIL) 25 MG tablet Take 1 tablet (25 mg total) by mouth 3 (three) times daily as needed for anxiety. For Anxiety Patient not taking: Reported on 04/25/2023 10/15/21   Starleen Blue, NP  nicotine polacrilex (NICORETTE) 2 MG gum Take 1 each (2 mg total) by mouth as needed for smoking cessation. Take for smoking cessation Patient not taking: Reported on 04/25/2023 10/15/21   Starleen Blue, NP  nicotine polacrilex  (NICORETTE) 2 MG gum Take 1 each (2 mg total) by mouth as needed for smoking cessation. Patient not taking: Reported on 04/25/2023 10/04/22   Phineas Inches, MD  risperiDONE (RISPERDAL M-TABS) 1 MG disintegrating tablet Take 1 tablet (1 mg total) by mouth daily. Patient not taking: Reported on 04/25/2023 10/04/22   Phineas Inches, MD  risperiDONE (RISPERDAL M-TABS) 3 MG disintegrating tablet Take 1 tablet (3 mg total) by mouth at bedtime. Patient not taking: Reported on 04/25/2023 10/04/22 11/03/22  Phineas Inches, MD  traZODone (DESYREL) 50 MG tablet Take 50 mg by mouth at bedtime as needed for sleep. Patient not taking: Reported on 04/25/2023    [provider]    Family History Family History  Problem Relation Age of Onset   Depression Father    Anxiety disorder Father    ADD / ADHD Brother     Social History Social History   Tobacco Use   Smoking status: Former    Packs/day: 0.50    Years: 10.00    Additional pack years: 0.00    Total pack years: 5.00    Types: Cigarettes    Quit date: 03/08/2014    Years since quitting: 9.1   Smokeless tobacco: Never  Vaping Use   Vaping Use: Never used  Substance Use Topics   Alcohol use: Yes    Comment: Pt denies substance use   Drug use: Yes    Types: Marijuana    Comment: Pt denies substance use     Allergies   Patient has no known allergies.   Review of Systems Review of Systems  HENT:  Positive for congestion.   All other systems reviewed and are negative.    Physical Exam Triage Vital Signs ED Triage Vitals  Enc Vitals Group     BP 04/25/23 1847 127/87     Pulse Rate 04/25/23 1847 85     Resp 04/25/23 1847 20     Temp 04/25/23 1847 97.6 F (36.4 C)     Temp Source 04/25/23 1847 Oral     SpO2 04/25/23 1847 98 %     Weight --      Height --      Head Circumference --      Peak Flow --      Pain Score 04/25/23 1843 10     Pain Loc --      Pain Edu? --      Excl. in GC? --    No data  found.  Updated Vital Signs BP 127/87 (BP Location: Left Arm)   Pulse 85   Temp 97.6 F (36.4 C) (Oral)   Resp 20   LMP 04/24/2023   SpO2 98%   Visual Acuity Right Eye Distance:   Left Eye Distance:   Bilateral Distance:    Right Eye Near:  Left Eye Near:    Bilateral Near:     Physical Exam Vitals and nursing note reviewed.  Constitutional:      Appearance: She is well-developed.  HENT:     Head: Normocephalic.  Cardiovascular:     Rate and Rhythm: Normal rate.  Pulmonary:     Effort: Pulmonary effort is normal.  Abdominal:     General: There is no distension.  Musculoskeletal:        General: Normal range of motion.     Cervical back: Normal range of motion.  Skin:    Findings: Rash present.     Comments: Multiple raised red areas,  (possible burrows)   Neurological:     General: No focal deficit present.     Mental Status: She is alert and oriented to person, place, and time.  Psychiatric:        Mood and Affect: Mood normal.      UC Treatments / Results  Labs (all labs ordered are listed, but only abnormal results are displayed) Labs Reviewed - No data to display  EKG   Radiology No results found.  Procedures Procedures (including critical care time)  Medications Ordered in UC Medications - No data to display  Initial Impression / Assessment and Plan / UC Course  I have reviewed the triage vital signs and the nursing notes.  Pertinent labs & imaging results that were available during my care of the patient were reviewed by me and considered in my medical decision making (see chart for details).      Final Clinical Impressions(s) / UC Diagnoses   Final diagnoses:  Right otitis media, unspecified otitis media type  Insect bite, unspecified site, initial encounter     Discharge Instructions      Return if any problems.    ED Prescriptions     Medication Sig Dispense Auth. Provider   amoxicillin-clavulanate (AUGMENTIN) 875-125  MG tablet Take 1 tablet by mouth 2 (two) times daily. 20 tablet Cheron Schaumann K, PA-C   hydrOXYzine (VISTARIL) 100 MG capsule Take 1 capsule (100 mg total) by mouth 3 (three) times daily as needed for itching. 30 capsule Camera Krienke K, PA-C   permethrin (ELIMITE) 5 % cream Apply 1 Application topically once for 1 dose. 1 g Elson Areas, [place]      PDMP not reviewed this encounter. An After Visit Summary was printed and given to the patient.       Elson Areas, [place] 04/25/23 1904

## 2023-04-25 NOTE — Discharge Instructions (Addendum)
Return if any problems.

## 2023-04-25 NOTE — ED Triage Notes (Signed)
Reports sinus pain and congestion, fullness in ears, nose and eyes.    Patient " I has scabs on face feels like bugs are going into ears"  Symptoms for 1 1/2 weeks  Has used peroxide in ears.

## 2023-05-06 ENCOUNTER — Emergency Department (HOSPITAL_COMMUNITY)
Admission: EM | Admit: 2023-05-06 | Discharge: 2023-05-11 | Disposition: A | Discharge status: Other Acute Inpatient Hospital | Payer: Medicaid Other | Attending: Emergency Medicine | Admitting: Emergency Medicine

## 2023-05-06 ENCOUNTER — Other Ambulatory Visit: Payer: Self-pay

## 2023-05-06 DIAGNOSIS — F29 Unspecified psychosis not due to a substance or known physiological condition: Secondary | ICD-10-CM | POA: Insufficient documentation

## 2023-05-06 DIAGNOSIS — F1914 Other psychoactive substance abuse with psychoactive substance-induced mood disorder: Secondary | ICD-10-CM | POA: Diagnosis not present

## 2023-05-06 DIAGNOSIS — F1994 Other psychoactive substance use, unspecified with psychoactive substance-induced mood disorder: Secondary | ICD-10-CM | POA: Diagnosis present

## 2023-05-06 DIAGNOSIS — F332 Major depressive disorder, recurrent severe without psychotic features: Secondary | ICD-10-CM | POA: Diagnosis present

## 2023-05-06 DIAGNOSIS — F312 Bipolar disorder, current episode manic severe with psychotic features: Secondary | ICD-10-CM | POA: Insufficient documentation

## 2023-05-06 DIAGNOSIS — Z79899 Other long term (current) drug therapy: Secondary | ICD-10-CM | POA: Diagnosis not present

## 2023-05-06 DIAGNOSIS — R461 Bizarre personal appearance: Secondary | ICD-10-CM | POA: Diagnosis present

## 2023-05-06 DIAGNOSIS — F3113 Bipolar disorder, current episode manic without psychotic features, severe: Secondary | ICD-10-CM | POA: Diagnosis not present

## 2023-05-06 DIAGNOSIS — F3130 Bipolar disorder, current episode depressed, mild or moderate severity, unspecified: Secondary | ICD-10-CM | POA: Diagnosis present

## 2023-05-06 LAB — ACETAMINOPHEN LEVEL: Acetaminophen (Tylenol), Serum: <10 ug/mL — ABNORMAL LOW (ref 10–30)

## 2023-05-06 LAB — COMPREHENSIVE METABOLIC PANEL
ALT: 24 U/L (ref 0–44)
AST: 30 U/L (ref 15–41)
Albumin: 3.5 g/dL (ref 3.5–5.0)
Alkaline Phosphatase: 49 U/L (ref 38–126)
Anion gap: 10 (ref 5–15)
BUN: 13 mg/dL (ref 6–20)
CO2: 22 mmol/L (ref 22–32)
Calcium: 8.8 mg/dL — ABNORMAL LOW (ref 8.9–10.3)
Chloride: 106 mmol/L (ref 98–111)
Creatinine, Ser: 1.12 mg/dL — ABNORMAL HIGH (ref 0.44–1.00)
GFR, Estimated: >60 mL/min (ref >60)
Glucose, Bld: 117 mg/dL — ABNORMAL HIGH (ref 70–99)
Potassium: 3 mmol/L — ABNORMAL LOW (ref 3.5–5.1)
Sodium: 138 mmol/L (ref 135–145)
Total Bilirubin: 0.7 mg/dL (ref 0.3–1.2)
Total Protein: 7 g/dL (ref 6.5–8.1)

## 2023-05-06 LAB — I-STAT BETA HCG BLOOD, ED (MC, WL, AP ONLY): I-stat hCG, quantitative: <5 m[IU]/mL (ref <5)

## 2023-05-06 LAB — CBC
HCT: 37.3 % (ref 36.0–46.0)
Hemoglobin: 12.5 g/dL (ref 12.0–15.0)
MCH: 29.2 pg (ref 26.0–34.0)
MCHC: 33.5 g/dL (ref 30.0–36.0)
MCV: 87.1 fL (ref 80.0–100.0)
Platelets: 373 10*3/uL (ref 150–400)
RBC: 4.28 MIL/uL (ref 3.87–5.11)
RDW: 12 % (ref 11.5–15.5)
WBC: 9.1 10*3/uL (ref 4.0–10.5)
nRBC: 0 % (ref 0.0–0.2)

## 2023-05-06 LAB — ETHANOL: Alcohol, Ethyl (B): <10 mg/dL (ref <10)

## 2023-05-06 LAB — SALICYLATE LEVEL: Salicylate Lvl: <7 mg/dL — ABNORMAL LOW (ref 7.0–30.0)

## 2023-05-06 MED ORDER — STERILE WATER FOR INJECTION IJ SOLN
INTRAMUSCULAR | Status: AC
  Filled 2023-05-06: qty 10

## 2023-05-06 MED ORDER — HYDROXYZINE HCL 25 MG PO TABS
25.0000 mg | ORAL_TABLET | Freq: Three times a day (TID) | ORAL | Status: DC | PRN
  Administered 2023-05-09: 25 mg via ORAL
  Filled 2023-05-06: qty 1

## 2023-05-06 MED ORDER — DROPERIDOL 2.5 MG/ML IJ SOLN
2.5000 mg | Freq: Once | INTRAMUSCULAR | Status: AC
  Administered 2023-05-06: 2.5 mg via INTRAMUSCULAR

## 2023-05-06 MED ORDER — MIDAZOLAM HCL 2 MG/2ML IJ SOLN: 4.0000 mg | Freq: Once | INTRAMUSCULAR | Status: DC

## 2023-05-06 MED ORDER — MIDAZOLAM HCL 2 MG/2ML IJ SOLN
4.0000 mg | Freq: Once | INTRAMUSCULAR | Status: AC
  Administered 2023-05-06: 4 mg via INTRAMUSCULAR
  Filled 2023-05-06: qty 4

## 2023-05-06 MED ORDER — RISPERIDONE 1 MG PO TABS
1.0000 mg | ORAL_TABLET | Freq: Every day | ORAL | Status: DC
  Administered 2023-05-09 – 2023-05-11 (×3): 1 mg via ORAL
  Filled 2023-05-06 (×4): qty 1

## 2023-05-06 MED ORDER — MIDAZOLAM HCL 2 MG/2ML IJ SOLN
2.0000 mg | Freq: Once | INTRAMUSCULAR | Status: DC
  Filled 2023-05-06: qty 2

## 2023-05-06 MED ORDER — TRAZODONE HCL 50 MG PO TABS
50.0000 mg | ORAL_TABLET | Freq: Every day | ORAL | Status: DC
  Filled 2023-05-06: qty 1

## 2023-05-06 MED ORDER — RISPERIDONE 2 MG PO TABS
3.0000 mg | ORAL_TABLET | Freq: Every day | ORAL | Status: DC
  Filled 2023-05-06: qty 1

## 2023-05-06 MED ORDER — CARIPRAZINE HCL 1.5 MG PO CAPS
1.5000 mg | ORAL_CAPSULE | Freq: Every day | ORAL | Status: DC
  Administered 2023-05-09 – 2023-05-11 (×3): 1.5 mg via ORAL
  Filled 2023-05-06 (×5): qty 1

## 2023-05-06 MED ORDER — DROPERIDOL 2.5 MG/ML IJ SOLN
1.2500 mg | Freq: Once | INTRAMUSCULAR | Status: DC
Start: 2023-05-06 — End: 2023-05-06
  Filled 2023-05-06: qty 2

## 2023-05-06 MED ORDER — BENZTROPINE MESYLATE 1 MG/ML IJ SOLN
0.5000 mg | Freq: Two times a day (BID) | INTRAMUSCULAR | Status: DC
  Administered 2023-05-09: 0.5 mg via INTRAMUSCULAR
  Filled 2023-05-06 (×3): qty 2

## 2023-05-06 MED ORDER — ZIPRASIDONE MESYLATE 20 MG IM SOLR
20.0000 mg | Freq: Once | INTRAMUSCULAR | Status: AC
  Administered 2023-05-06: 20 mg via INTRAMUSCULAR
  Filled 2023-05-06: qty 20

## 2023-05-06 MED ORDER — DROPERIDOL 2.5 MG/ML IJ SOLN: 2.5000 mg | Freq: Once | INTRAMUSCULAR | Status: DC

## 2023-05-06 NOTE — ED Notes (Signed)
Flip flops and grey sheet placed in belongings bag behind [facility] nursing station

## 2023-05-06 NOTE — ED Notes (Signed)
Patient now dressed in burgundy scrubs. Lying in bed resting, no distress noted, respirations even and unlabored. 1:1 sitter at bedside.

## 2023-05-06 NOTE — ED Notes (Signed)
Patient refused to take night meds ordered, see mar.

## 2023-05-06 NOTE — ED Notes (Addendum)
Pt refuses to cover up and states she isn't putting clothes on.

## 2023-05-06 NOTE — ED Notes (Signed)
Patient refusing to offer urine sample at this time.

## 2023-05-06 NOTE — ED Provider Notes (Signed)
Attempted to see patient, patient is asleep, due to the IM Versed and droperidol given at 1528. Patient attempted to open her eyes, looked at provider and rolled the opposite way of provider.  Patient has no clothes on, sitter is in room with patient.
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
## 2023-05-07 DIAGNOSIS — F1994 Other psychoactive substance use, unspecified with psychoactive substance-induced mood disorder: Secondary | ICD-10-CM

## 2023-05-07 DIAGNOSIS — F3113 Bipolar disorder, current episode manic without psychotic features, severe: Secondary | ICD-10-CM

## 2023-05-07 MED ORDER — ZIPRASIDONE MESYLATE 20 MG IM SOLR: 20.0000 mg | INTRAMUSCULAR | Status: AC | PRN

## 2023-05-07 MED ORDER — OLANZAPINE 5 MG PO TBDP: 5.0000 mg | ORAL_TABLET | Freq: Three times a day (TID) | ORAL | Status: DC | PRN

## 2023-05-07 MED ORDER — ZIPRASIDONE MESYLATE 20 MG IM SOLR
INTRAMUSCULAR | Status: AC
  Administered 2023-05-07: 20 mg via INTRAMUSCULAR
  Filled 2023-05-07: qty 20

## 2023-05-07 MED ORDER — LORAZEPAM 1 MG PO TABS
1.0000 mg | ORAL_TABLET | ORAL | Status: AC | PRN
  Administered 2023-05-08: 1 mg via ORAL
  Filled 2023-05-07: qty 1

## 2023-05-07 NOTE — ED Notes (Signed)
Staff attempted to get vitals patient begin yelling "Get Away From Me"

## 2023-05-07 NOTE — ED Notes (Signed)
Pt. Arrives in to TCU yell and screaming at staff. Pt. Was picked up by tcu security and put into the bed. Pt. Was offered socks and a blanket which she refused. Pt. Given a cup of water.

## 2023-05-07 NOTE — BH Assessment (Signed)
According to the charge RN and Diplomatic Services operational officer, there is only one tele-cart at [facility] and it is currently inoperable. IT has been contacted. Pt will be assessed when machine is operational.   Pamalee Leyden, [facility], [facility] Triage Specialist [phone]
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
## 2023-05-07 NOTE — ED Notes (Signed)
Patient turning over in bed and awake, asked if she wanted her night med and she states "fuck you bitch." Continuing to refuse meds, see mar. Sitter at bedside.

## 2023-05-07 NOTE — ED Provider Notes (Signed)
Emergency Medicine Observation Re-evaluation Note  Natsumi Clint is a 37 y.o. female, seen on rounds today.  Pt initially presented to the ED for complaints of IVC and Agitation Currently, the patient is awaiting psychiatry evaluation.  Physical Exam  BP 127/76 (BP Location: Right Arm)   Pulse 79   Temp 97.8 F (36.6 C) (Axillary)   Resp 18   Ht 5\' 7"  (1.702 m)   Wt 68 kg   LMP 04/24/2023   SpO2 100%   BMI 23.49 kg/m  Physical Exam General: NAD Cardiac: RR Lungs: even, unlabored Psych: NA  ED Course / MDM  EKG:EKG Interpretation  Date/Time:  Saturday May 06 2023 13:40:09 EDT Ventricular Rate:  88 PR Interval:  138 QRS Duration: 78 QT Interval:  296 QTC Calculation: 358 R Axis:   74 Text Interpretation: Sinus rhythm Nonspecific repol abnormality, diffuse leads Confirmed by Alvester Chou [phone]) on 05/06/2023 2:15:35 PM  I have reviewed the labs performed to date as well as medications administered while in observation.  Recent changes in the last 24 hours include arrived with erratic behavior under IVC, received medications for agitation.  Plan  Current plan is for psyhiatric evalaution.    Alvira Monday, MD 05/08/23 [phone]

## 2023-05-07 NOTE — ED Notes (Signed)
Pt. Resting calmly in bed with the lights on. Respirations are calm and even.

## 2023-05-07 NOTE — ED Provider Notes (Signed)
Attempted to speak with patient, provider closed the door to her room for privacy, and she demanded provider open the door, she did not need privacy. Provider opened the door, and calmly began speaking with patient, patient began screaming to let her out of this place. Then began yelling her name, and saying she needs someone to help her leave this place. Provider informed her, that she will come back when patient calms down. Patient began yelling "you will talk to me now".   Provider called patient mother Hilbert Bible, no answer, left a HIPAA compliant message.

## 2023-05-07 NOTE — ED Notes (Signed)
Patient awake and refused to speak to NP she is agitated and attempted to walk to of TCU, security and RN able to redirect her back to her room

## 2023-05-07 NOTE — Consult Note (Signed)
BH ED ASSESSMENT   Reason for Consult: Psych Consult Referring Physician:  Dr. Renaye Rakers Best Identification: Ana Best MRN:  [id number] ED Chief Complaint: Bipolar I disorder, current or most recent episode manic, severe (HCC)  Diagnosis:  Principal Problem:   Bipolar I disorder, current or most recent episode manic, severe (HCC) Active Problems:   Substance induced mood disorder [facility])   ED Assessment Time Calculation: Start Time: 1230 Stop Time: 1310 Total Time in Minutes (Assessment Completion): 40   Subjective:   Ana Best is a 37 y.o. female Best admitted to [facility], Best  BIBA from street. Police were picking pt up to IVC them. Pt was incoherent, and naked. Took several [employer] and EMS to restrain them. Best was medicated with  400 mg of ketamine reroute to the ER.   HPI: Ana Best seen face to face by this provider, consulted with Dr. Rulon Eisenmenger; and chart reviewed on 05/07/23.  On evaluation, provider attempted to speak with Best, provider closed the door to her room for privacy, and she demanded provider open the door, she did not need privacy. Provider opened the door, and calmly began speaking with Best, Best began screaming to let her out of this place. Then began yelling her name, and saying she needs someone to help her leave this place. Provider informed her, that she will come back when Best calms down. Best began yelling "you will talk to me now".   During evaluation Ana Best was laying in bed, until she saw provider, she jumped out of bed and started demanding that she leave. She is alert, oriented x 3, she is verbally aggressive and upset about being in the hospital. Her mood is angry with congruent affect. She has normal speech, with increased volume, and inappropriate behavior.  Unable to assess.,  If Best is having any homicidal, suicidal or hallucinations, Best's thought processes or thought  content, due to Best being angry and irritable and not wanting to speak with provider.  Best has not provided a UDS.  Provider spoke to Best's mother Ana Best, who reported that Best usually is not a violent person.  She stated Best graduated from college and has a degree in early childhood education, has an 30-year-old son.  She stated that Best started having problems in her 30s where she struggled with her bipolar depression, and about 4 years ago she noted that things began to fall apart for Best, stated that she had been IVC to 4 times in 4 years, she became confrontational, and combative to where she "plays the victim".  She states that her daughter is on methamphetamines.  Ana Best states that "she lives in the house I brought for her and her son, and she has completely destroyed it, has holes in the wall from her hammering."  Ana Best states that she used to pay for her daughter's rent and utilities, states that she is not financially helping her daughter anymore because of the enabling that it has caused.  She states that she has very little contact with Best, has not spoken to Best about 6 months, says that Best accused her of running a sex traffic ring, and accused her mother of birth in a twin child, states that her mother drowned her.  She states that she encouraged her daughter to see a psychiatrist for any evaluation in [place] last summer.Best was manipulative and did not tell the psychiatrist all about her mental illness.  She has been diagnosed with  Bipolar but never agreed to this diagnosis. Best have been tried on numerous medications  in the past. So far for a year she has not taken any medications.  Best's mother and son's father are taking care of Best's 4-year-old son.  Ana Best is asking to be informed of Best's care, states that she is selling the house that Best currently lives in, because she feels like she is enabling  daughter, wants to make sure the Best gets hooked up with a discharge social work team because she will be homeless.  Best's mother is asking that Best goes to [facility] if possible.  Informed her that we go where there is a bed availability, if there is a bed available there will have social work team look into it.  Ana Best attempted to go into the Best's room and speak with her, Best was very irritable and angry and told her to get out.  Mother became tearful but understood that her not visiting at this time is the Best with Best's treatment.  Addendum: @ 1615, provider attempted to go see Best, Best stated "get me the hell out of here, they are running a fucking simulation at my house and they have implanted things in my feet, my mother and you all are in on it, do not like you do not know what I am talking about."Best continues to be angry, and demanding, continues to be yelling and screaming throughout the milieu.  Best continues to be delusional.    Past Psychiatric History: Bipolar disorder, has been  hospitalization at [facility], and [facility] (09/2022).  Risk to Self or Others: Risk to Self: Yes Risk to Others: Yes  Prior Inpatient Therapy:  Yes  Prior Outpatient Therapy: Yes  [place] Scale:  Flowsheet Row ED from 05/06/2023 in [facility] Emergency Department at [facility] ED from 04/25/2023 in [facility] at [facility] Ad[facility] (Discharged) [facility]  C-SSRS RISK CATEGORY No Risk No Risk No Risk       AIMS:  , , ,  ,   ASAM:    Substance Abuse:     Past Medical History:  Past Medical History:  Diagnosis Date   Anxiety    Bipolar 1 disorder (HCC)    Depression    Hypertension     Past Surgical History:  Procedure Laterality Date   DILATION AND CURETTAGE OF UTERUS     TONSILLECTOMY     WISDOM TOOTH EXTRACTION     Family History:  Family  History  Problem Relation Age of Onset   Depression Father    Anxiety disorder Father    ADD / ADHD Brother    Social History:  Social History   Substance and Sexual Activity  Alcohol Use Yes   Comment: Pt denies substance use     Social History   Substance and Sexual Activity  Drug Use Yes   Types: Marijuana   Comment: Pt denies substance use    Social History   Socioeconomic History   Marital status: Single    Spouse name: Not on file   Number of children: Not on file   Years of education: Not on file   Highest education level: Not on file  Occupational History   Not on file  Tobacco Use   Smoking status: Former    Packs/day: 0.50    Years: 10.00    Additional pack years: 0.00  Total pack years: 5.00    Types: Cigarettes    Quit date: 03/08/2014    Years since quitting: 9.1   Smokeless tobacco: Never  Vaping Use   Vaping Use: Never used  Substance and Sexual Activity   Alcohol use: Yes    Comment: Pt denies substance use   Drug use: Yes    Types: Marijuana    Comment: Pt denies substance use   Sexual activity: Yes    Partners: Male    Comment: number of sex partners in the last 12 months  1  Other Topics Concern   Not on file  Social History Narrative   ** Merged History Encounter **       Social Determinants of Health   Financial Resource Strain: Not on file  Food Insecurity: No Food Insecurity (09/23/2022)   Hunger Vital Sign    Worried About Running Out of Food in the Last Year: Never true    Ran Out of Food in the Last Year: Never true  Transportation Needs: No Transportation Needs (09/23/2022)   PRAPARE - Administrator, Civil Service (Medical): No    Lack of Transportation (Non-Medical): No  Physical Activity: Not on file  Stress: Not on file  Social Connections: Not on file      Allergies:  Not on File  Labs:  Results for orders placed or performed during the hospital encounter of 05/06/23 (from the past 48 hour(s))   Comprehensive metabolic panel     Status: Abnormal   Collection Time: 05/06/23  2:05 PM  Result Value Ref Range   Sodium 138 135 - 145 mmol/L   Potassium 3.0 (L) 3.5 - 5.1 mmol/L   Chloride 106 98 - 111 mmol/L   CO2 22 22 - 32 mmol/L   Glucose, Bld 117 (H) 70 - 99 mg/dL    Comment: Glucose reference range applies only to samples taken after fasting for at least 8 hours.   BUN 13 6 - 20 mg/dL   Creatinine, Ser 4.40 (H) 0.44 - 1.00 mg/dL   Calcium 8.8 (L) 8.9 - 10.3 mg/dL   Total Protein 7.0 6.5 - 8.1 g/dL   Albumin 3.5 3.5 - 5.0 g/dL   AST 30 15 - 41 U/L   ALT 24 0 - 44 U/L   Alkaline Phosphatase 49 38 - 126 U/L   Total Bilirubin 0.7 0.3 - 1.2 mg/dL   GFR, Estimated >10 >27 mL/min    Comment: (NOTE) Calculated using the CKD-EPI Creatinine Equation (2021)    Anion gap 10 5 - 15    Comment: Performed at [facility], [address] [place], [place] [address]  Ethanol     Status: None   Collection Time: 05/06/23  2:05 PM  Result Value Ref Range   Alcohol, Ethyl (B) <10 <10 mg/dL    Comment: (NOTE) Lowest detectable limit for serum alcohol is 10 mg/dL.  For medical purposes only. Performed at [facility], [address], [place], [place] [address]   Salicylate level     Status: Abnormal   Collection Time: 05/06/23  2:05 PM  Result Value Ref Range   Salicylate Lvl <7.0 (L) 7.0 - 30.0 mg/dL    Comment: Performed at [facility], [address], [place], [place] [address]  Acetaminophen level     Status: Abnormal   Collection Time: 05/06/23  2:05 PM  Result Value Ref Range   Acetaminophen (Tylenol), Serum <10 (L) 10 -  30 ug/mL    Comment: (NOTE) Therapeutic concentrations vary significantly. A range of 10-30 ug/mL  may be an effective concentration for many patients. However, some  are Best treated at concentrations outside of this range. Acetaminophen concentrations >150 ug/mL at 4 hours after ingestion  and >50  ug/mL at 12 hours after ingestion are often associated with  toxic reactions.  Performed at [facility], [address], [place], [place] [address]   cbc     Status: None   Collection Time: 05/06/23  2:05 PM  Result Value Ref Range   WBC 9.1 4.0 - 10.5 K/uL   RBC 4.28 3.87 - 5.11 MIL/uL   Hemoglobin 12.5 12.0 - 15.0 g/dL   HCT 91.4 78.2 - 95.6 %   MCV 87.1 80.0 - 100.0 fL   MCH 29.2 26.0 - 34.0 pg   MCHC 33.5 30.0 - 36.0 g/dL   RDW 21.3 08.6 - 57.8 %   Platelets 373 150 - 400 K/uL   nRBC 0.0 0.0 - 0.2 %    Comment: Performed at [facility], [address], [place], [place] [address]  I-Stat beta hCG blood, ED     Status: None   Collection Time: 05/06/23  2:14 PM  Result Value Ref Range   I-stat hCG, quantitative <5.0 <5 mIU/mL   Comment 3            Comment:   GEST. AGE      CONC.  (mIU/mL)   <=1 WEEK        5 - 50     2 WEEKS       50 - 500     3 WEEKS       100 - 10,000     4 WEEKS     1,000 - 30,000        FEMALE AND NON-PREGNANT FEMALE:     LESS THAN 5 mIU/mL     Current Facility-Administered Medications  Medication Dose Route Frequency Provider Last Rate Last Admin   benztropine mesylate (COGENTIN) injection 0.5 mg  0.5 mg Intramuscular BID Trifan, Kermit Balo, MD       cariprazine (VRAYLAR) capsule 1.5 mg  1.5 mg Oral Daily Trifan, Kermit Balo, MD       hydrOXYzine (ATARAX) tablet 25 mg  25 mg Oral TID PRN Terald Sleeper, MD       risperiDONE (RISPERDAL) tablet 1 mg  1 mg Oral Daily Trifan, Kermit Balo, MD       risperiDONE (RISPERDAL) tablet 3 mg  3 mg Oral QHS Terald Sleeper, MD       traZODone (DESYREL) tablet 50 mg  50 mg Oral QHS Terald Sleeper, MD       Current Outpatient Medications  Medication Sig Dispense Refill   amoxicillin-clavulanate (AUGMENTIN) 875-125 MG tablet Take 1 tablet by mouth 2 (two) times daily. (Best not taking: Reported on 05/06/2023) 20 tablet 0   benztropine (COGENTIN) 0.5 MG tablet Take 1  tablet (0.5 mg total) by mouth every 12 (twelve) hours. (Best not taking: Reported on 05/06/2023) 60 tablet 0   buPROPion (WELLBUTRIN XL) 150 MG 24 hr tablet Take 1 tablet (150 mg total) by mouth daily. Depression (Best not taking: Reported on 05/06/2023) 30 tablet 0   cariprazine (VRAYLAR) 1.5 MG capsule Take 1 capsule (1.5 mg total) by mouth daily. Bipolar Disorder (Best not taking: Reported on 05/06/2023) 30 capsule 0   hydrOXYzine (ATARAX) 25 MG tablet Take  1 tablet (25 mg total) by mouth 3 (three) times daily as needed for anxiety. (Best not taking: Reported on 05/06/2023) 30 tablet 0   hydrOXYzine (ATARAX/VISTARIL) 25 MG tablet Take 1 tablet (25 mg total) by mouth 3 (three) times daily as needed for anxiety. For Anxiety (Best not taking: Reported on 05/06/2023) 30 tablet 0   hydrOXYzine (VISTARIL) 100 MG capsule Take 1 capsule (100 mg total) by mouth 3 (three) times daily as needed for itching. (Best not taking: Reported on 05/06/2023) 30 capsule 0   nicotine polacrilex (NICORETTE) 2 MG gum Take 1 each (2 mg total) by mouth as needed for smoking cessation. Take for smoking cessation (Best not taking: Reported on 05/06/2023) 1 tablet 0   nicotine polacrilex (NICORETTE) 2 MG gum Take 1 each (2 mg total) by mouth as needed for smoking cessation. (Best not taking: Reported on 05/06/2023) 100 tablet 0   risperiDONE (RISPERDAL M-TABS) 1 MG disintegrating tablet Take 1 tablet (1 mg total) by mouth daily. (Best not taking: Reported on 05/06/2023) 30 tablet 0   risperiDONE (RISPERDAL M-TABS) 3 MG disintegrating tablet Take 1 tablet (3 mg total) by mouth at bedtime. (Best not taking: Reported on 05/06/2023) 30 tablet 0    Musculoskeletal: Strength & Muscle Tone: within normal limits Gait & Station: normal Best leans: N/A   Psychiatric Specialty Exam: Presentation  General Appearance:  Appropriate for Environment  Eye Contact: Good  Speech: Clear and Coherent; Normal  Rate  Speech Volume: Normal  Handedness: Right   Mood and Affect  Mood: Euthymic  Affect: Appropriate; Congruent   Thought Process  Thought Processes: Coherent; Goal Directed; Linear  Descriptions of Associations:Intact  Orientation:Full (Time, Place and Person)  Thought Content:Logical  History of Schizophrenia/Schizoaffective disorder:No  Duration of Psychotic Symptoms:N/A  Hallucinations:No data recorded Ideas of Reference:None  Suicidal Thoughts:No data recorded Homicidal Thoughts:No data recorded  Sensorium  Memory: Immediate Good; Recent Good; Remote Good  Judgment: Fair  Insight: Fair   Art therapist  Concentration: Good  Attention Span: Good  Recall: Good  Fund of Knowledge: Good  Language: Good   Psychomotor Activity  Psychomotor Activity:No data recorded  Assets  Assets: Communication Skills; Desire for Improvement; Physical Health    Sleep  Sleep:No data recorded  Physical Exam: Physical Exam Vitals and nursing note reviewed. Exam conducted with a chaperone present.  Neurological:     Mental Status: She is alert.  Psychiatric:        Attention and Perception: She is inattentive.        Mood and Affect: Mood is anxious. Affect is angry.        Speech: Speech normal.        Behavior: Behavior is agitated and aggressive.        Judgment: Judgment is impulsive and inappropriate.     Comments: Unable to assess thought content    Review of Systems  Psychiatric/Behavioral:  Positive for depression, hallucinations and substance abuse.        Best is delusional   Blood pressure 127/76, pulse 79, temperature 97.8 F (36.6 C), temperature source Axillary, resp. rate 18, height 5\' 7"  (1.702 m), weight 68 kg, last menstrual period 04/24/2023, SpO2 100 %. Body mass index is 23.49 kg/m.   Medical Decision Making: So far she has refused to offer urine for UDS.  Best at this time is not willing to take  medications. Based on reported  behavior at home and reported diagnosis of Bipolar disorder she meets criteria for  inpatient hospitalization.  We will continue to pursue UDS while we look for bed placement. Pt case reviewed and discussed with Dr. Rulon Eisenmenger. Pt does meet criteria for IVC and inpatient psychiatric treatment. Best needs inpatient psychiatric admission for stabilization and treatment. [facility] and CSW notified of disposition. Will restart Best home medications.  Best given Zyprexa 5 mg for psychosis.  EDP, RN, and LCSW notified of disposition.    Problem 1: Unspecified mood disorder   Problem 2: Bipolar disorder     Disposition:  Admit, seek bed placement.    Alona Bene, PMHNP 05/07/2023 3:48 PM

## 2023-05-07 NOTE — ED Notes (Signed)
Report received from [facility], assumed care of patient at this time. Patient resting in bed with eyes closed, no distress noted, respirations even and unlabored.

## 2023-05-07 NOTE — BH Assessment (Signed)
TTS attempted to assess pt. Per [place], RN, pt is currently sleeping and can become aggressive when attempting to wake up. TTS to be notified when pt is ready for assessment.   Dava Najjar, MA, [facility], [facility]  Triage Specialist

## 2023-05-07 NOTE — ED Notes (Signed)
Patient extra aggravated yelling out, throwing nutrition given to her, threatening to run out of facility and verbally threatening staff.

## 2023-05-07 NOTE — ED Provider Notes (Signed)
Attempted to see patient, patient remains asleep, told provider and sitter she did not want to talk, and would not talk when provider asked questions. Will try again when patient goes to bathroom, or wakes up to eat. Will contact her mother Hilbert Bible for collateral.

## 2023-05-07 NOTE — ED Notes (Signed)
Patient is refusing to eat, speak to Psychiatric NP and refusing medications

## 2023-05-08 LAB — URINALYSIS, ROUTINE W REFLEX MICROSCOPIC
Bilirubin Urine: NEGATIVE
Glucose, UA: NEGATIVE mg/dL
Hgb urine dipstick: NEGATIVE
Ketones, ur: NEGATIVE mg/dL
Nitrite: NEGATIVE
Protein, ur: NEGATIVE mg/dL
Specific Gravity, Urine: 1.025 (ref 1.005–1.030)
pH: 5 (ref 5.0–8.0)

## 2023-05-08 LAB — RAPID URINE DRUG SCREEN, HOSP PERFORMED
Amphetamines: NOT DETECTED
Barbiturates: NOT DETECTED
Benzodiazepines: POSITIVE — AB
Cocaine: NOT DETECTED
Opiates: NOT DETECTED
Tetrahydrocannabinol: POSITIVE — AB

## 2023-05-08 MED ORDER — ZIPRASIDONE MESYLATE 20 MG IM SOLR
20.0000 mg | Freq: Once | INTRAMUSCULAR | Status: AC
  Administered 2023-05-08: 20 mg via INTRAMUSCULAR
  Filled 2023-05-08: qty 20

## 2023-05-08 MED ORDER — LOPERAMIDE HCL 2 MG PO CAPS
2.0000 mg | ORAL_CAPSULE | ORAL | Status: DC | PRN
  Administered 2023-05-08: 2 mg via ORAL
  Filled 2023-05-08: qty 1

## 2023-05-08 MED ORDER — LORAZEPAM 2 MG/ML IJ SOLN
2.0000 mg | Freq: Once | INTRAMUSCULAR | Status: AC
  Administered 2023-05-08: 2 mg via INTRAMUSCULAR
  Filled 2023-05-08: qty 1

## 2023-05-08 MED ORDER — ACETAMINOPHEN 500 MG PO TABS
1000.0000 mg | ORAL_TABLET | Freq: Once | ORAL | Status: AC
  Administered 2023-05-08: 1000 mg via ORAL
  Filled 2023-05-08: qty 2

## 2023-05-08 MED ORDER — DIPHENHYDRAMINE HCL 50 MG/ML IJ SOLN
50.0000 mg | Freq: Once | INTRAMUSCULAR | Status: AC
  Administered 2023-05-08: 50 mg via INTRAMUSCULAR
  Filled 2023-05-08: qty 1

## 2023-05-08 NOTE — ED Notes (Signed)
Attempted to obtain vitals on patient, still refusing at this time.

## 2023-05-08 NOTE — Consult Note (Addendum)
Attempt to evaluate patient failed at this time as she is still sleeping and not able to engage in any meaningful conversation.  She was Medicated earlier around 4:30 am with Geodon injection.  Will try again when patient is awake.  Patient awake crying and refusing to engage in conversation.  She refused to walk and requested a wheel Chair to and from the bathroom.  Patient states she is in pain and need strong Pain Medication.  Patient is very uncooperative at this time.

## 2023-05-08 NOTE — Progress Notes (Incomplete)
CSW requested [facility] Erica Wright, RN to review for inpatient behavioral health placement. ON 05/07/23 per Jadeka Motley-Mangrum, PMHNP pt meets inpatient behavioral health placement.  Arno Cullers N. Ancelmo Hunt, MSW, LCSWA 05/08/2023 10:25 PM   

## 2023-05-08 NOTE — ED Notes (Signed)
Patient resting in bed with eyes closed, no distress noted, respirations even and unlabored. Sitter at bedside.

## 2023-05-08 NOTE — ED Provider Notes (Signed)
Emergency Medicine Observation Re-evaluation Note  Ana Best is a 37 y.o. female, seen on rounds today.  Pt initially presented to the ED for complaints of IVC and Agitation Currently, the patient is sleeping.  Physical Exam  BP (!) 86/76 (BP Location: Right Arm)   Pulse 61   Temp 98.1 F (36.7 C) (Oral)   Resp 18   Ht 5\' 7"  (1.702 m)   Wt 68 kg   LMP 04/24/2023   SpO2 100%   BMI 23.49 kg/m  Physical Exam General: no distress  Psych: Sleeping, earlier the patient was noncooperative, but is currently calm.  ED Course / MDM  EKG:EKG Interpretation  Date/Time:  Saturday May 06 2023 13:40:09 EDT Ventricular Rate:  88 PR Interval:  138 QRS Duration: 78 QT Interval:  296 QTC Calculation: 358 R Axis:   74 Text Interpretation: Sinus rhythm Nonspecific repol abnormality, diffuse leads Confirmed by Alvester Chou [phone]) on 05/06/2023 2:15:35 PM  I have reviewed the labs performed to date as well as medications administered while in observation.  Recent changes in the last 24 hours include 1 episode of being uncooperative with psychiatry staff..  Plan  Current plan is for placement.Gerhard Munch, MD 05/08/23 1245

## 2023-05-08 NOTE — ED Notes (Signed)
Patient woke up and became agitated, yelling/cursing at staff after another patient on unit became disruptive. Patient yelling about wanting to leave and attempted to leave unit, patient was brought back in by staff and security. Patient continuing to yell and be verbally threatening. Rancour MD notified of patient condition. Orders received for meds, see mar.

## 2023-05-08 NOTE — ED Notes (Signed)
Patient irritable and hostile to RN  Patient refused medication.

## 2023-05-08 NOTE — Progress Notes (Signed)
CSW requested [facility] Edythe Clarity, RN to review for inpatient behavioral health placement. ON 05/07/23 per Alona Bene, PMHNP pt meets inpatient behavioral health placement.  Maryjean Ka, MSW, [facility] 05/08/2023 10:25 PM

## 2023-05-09 DIAGNOSIS — F3130 Bipolar disorder, current episode depressed, mild or moderate severity, unspecified: Secondary | ICD-10-CM | POA: Diagnosis present

## 2023-05-09 DIAGNOSIS — F332 Major depressive disorder, recurrent severe without psychotic features: Secondary | ICD-10-CM

## 2023-05-09 DIAGNOSIS — F3113 Bipolar disorder, current episode manic without psychotic features, severe: Secondary | ICD-10-CM | POA: Diagnosis not present

## 2023-05-09 DIAGNOSIS — F1994 Other psychoactive substance use, unspecified with psychoactive substance-induced mood disorder: Secondary | ICD-10-CM | POA: Diagnosis not present

## 2023-05-09 MED ORDER — BACITRACIN ZINC 500 UNIT/GM EX OINT
TOPICAL_OINTMENT | Freq: Two times a day (BID) | CUTANEOUS | Status: DC
  Administered 2023-05-09: 2 via TOPICAL
  Administered 2023-05-11: 1 via TOPICAL
  Filled 2023-05-09: qty 1.8
  Filled 2023-05-09: qty 0.9

## 2023-05-09 MED ORDER — BACITRACIN ZINC 500 UNIT/GM EX OINT: TOPICAL_OINTMENT | Freq: Two times a day (BID) | CUTANEOUS | 0 refills | Status: DC

## 2023-05-09 MED ORDER — CARIPRAZINE HCL 1.5 MG PO CAPS: 1.5000 mg | ORAL_CAPSULE | Freq: Every day | ORAL | 0 refills | Status: DC

## 2023-05-09 MED ORDER — RISPERIDONE 1 MG PO TABS: 1.0000 mg | ORAL_TABLET | Freq: Every day | ORAL | 0 refills | Status: DC

## 2023-05-09 MED ORDER — POTASSIUM CHLORIDE CRYS ER 20 MEQ PO TBCR
40.0000 meq | EXTENDED_RELEASE_TABLET | Freq: Two times a day (BID) | ORAL | Status: DC
  Administered 2023-05-09 – 2023-05-10 (×2): 40 meq via ORAL
  Filled 2023-05-09 (×2): qty 2

## 2023-05-09 MED ORDER — HYDROXYZINE HCL 25 MG PO TABS: 25.0000 mg | ORAL_TABLET | Freq: Three times a day (TID) | ORAL | 0 refills | Status: DC | PRN

## 2023-05-09 MED ORDER — RISPERIDONE 3 MG PO TABS: 3.0000 mg | ORAL_TABLET | Freq: Every day | ORAL | 0 refills | Status: DC

## 2023-05-09 MED ORDER — TRAZODONE HCL 50 MG PO TABS: 50.0000 mg | ORAL_TABLET | Freq: Every evening | ORAL | 0 refills | Status: DC | PRN

## 2023-05-09 MED ORDER — BENZTROPINE MESYLATE 1 MG PO TABS: 1.0000 mg | ORAL_TABLET | Freq: Every day | ORAL | 0 refills | Status: DC

## 2023-05-09 MED ORDER — ACETAMINOPHEN 325 MG PO TABS
650.0000 mg | ORAL_TABLET | Freq: Four times a day (QID) | ORAL | Status: DC | PRN
  Administered 2023-05-09: 650 mg via ORAL
  Filled 2023-05-09: qty 2

## 2023-05-09 NOTE — Consult Note (Cosign Needed Addendum)
BH ED ASSESSMENT   Reason for Consult:  Psychiatry evaluation Referring Physician:  ER Physician Patient Identification: Ana Best MRN:  [id number] ED Chief Complaint: Major depressive disorder, recurrent, severe without psychotic features (HCC)  Diagnosis:  Principal Problem:   Major depressive disorder, recurrent, severe without psychotic features (HCC) Active Problems:   Substance induced mood disorder [facility])   ED Assessment Time Calculation: Start Time: 1709 Stop Time: 1740 Total Time in Minutes (Assessment Completion): 31   Subjective:   Ana Best is a 37 y.o. female patient admitted with previous hx of Bipolar 1 disorder, Substance induced mood disorder, Amphetamine Dependence, ADHD and Generalized anxiety was discharged this morning to go home.  Patient wrapped bed sheet around his neck as soon as Provider relayed a message to her that her mother says she is not coming back home.  Patient then out of anger and rage warned Provider to leave her room immediately.  HPI:  Patient spent four days in the ER and was discharged this morning as she has been stable.  Patient called her mother to came pick her up but she said no to her.  Mother informed Provider to tell patient that she will not have a place to stay and that she is fixing her house to sell.  Patient wrapped a bed sheet around her neck and instructed Provider to leave her room immediately.  Patient is IVC again after previous IVC was rescinded.  Patient is refusing to speak with staff or provider.  Patient will be admitted to inpatient Psychiatry unit.  We will seek placement at any facility with available bed.  Meanwhile Patient will resume her Risperidone, Vraylar, Cogentin, Trazodone  and Hydroxyzine.    Past Psychiatric History: Bipolar  2 disorder, Amphetamine use disorder.  Previous hospitalization at [facility] and was in [facility] 2022 and 2023.  Risk to Self or Others: Is the patient at risk to self? Yes Has the  patient been a risk to self in the past 6 months? Yes Has the patient been a risk to self within the distant past? Yes Is the patient a risk to others? No Has the patient been a risk to others in the past 6 months? No Has the patient been a risk to others within the distant past? No  [place] Scale:  Flowsheet Row ED from 05/06/2023 in [facility] Emergency Department at [facility] ED from 04/25/2023 in [facility] at [facility] (Discharged[facility]  C-SSRS RISK CATEGORY No Risk No Risk No Risk       AIMS:  , , ,  ,   ASAM:    Substance Abuse:     Past Medical History:  Past Medical History:  Diagnosis Date   Anxiety    Bipolar 1 disorder (HCC)    Depression    Hypertension     Past Surgical History:  Procedure Laterality Date   DILATION AND CURETTAGE OF UTERUS     TONSILLECTOMY     WISDOM TOOTH EXTRACTION     Family History:  Family History  Problem Relation Age of Onset   Depression Father    Anxiety disorder Father    ADD / ADHD Brother    Family Psychiatric  History: Father suffers from Bipolar disorder Social History:  Social History   Substance and Sexual Activity  Alcohol Use Yes   Comment: Pt denies substance use     Social History  Substance and Sexual Activity  Drug Use Yes   Types: Marijuana   Comment: Pt denies substance use    Social History   Socioeconomic History   Marital status: Single    Spouse name: Not on file   Number of children: Not on file   Years of education: Not on file   Highest education level: Not on file  Occupational History   Not on file  Tobacco Use   Smoking status: Former    Packs/day: 0.50    Years: 10.00    Additional pack years: 0.00    Total pack years: 5.00    Types: Cigarettes    Quit date: 03/08/2014    Years since quitting: 9.1   Smokeless tobacco: Never  Vaping Use   Vaping Use: Never used  Substance and Sexual  Activity   Alcohol use: Yes    Comment: Pt denies substance use   Drug use: Yes    Types: Marijuana    Comment: Pt denies substance use   Sexual activity: Yes    Partners: Male    Comment: number of sex partners in the last 12 months  1  Other Topics Concern   Not on file  Social History Narrative   ** Merged History Encounter **       Social Determinants of Health   Financial Resource Strain: Not on file  Food Insecurity: No Food Insecurity (09/23/2022)   Hunger Vital Sign    Worried About Running Out of Food in the Last Year: Never true    Ran Out of Food in the Last Year: Never true  Transportation Needs: No Transportation Needs (09/23/2022)   PRAPARE - Administrator, Civil Service (Medical): No    Lack of Transportation (Non-Medical): No  Physical Activity: Not on file  Stress: Not on file  Social Connections: Not on file   Additional Social History:    Allergies:  Not on File  Labs:  Results for orders placed or performed during the hospital encounter of 05/06/23 (from the past 48 hour(s))  Urinalysis, Routine w reflex microscopic -Urine, Clean Catch     Status: Abnormal   Collection Time: 05/07/23  6:38 PM  Result Value Ref Range   Color, Urine YELLOW YELLOW   APPearance HAZY (A) CLEAR   Specific Gravity, Urine 1.025 1.005 - 1.030   pH 5.0 5.0 - 8.0   Glucose, UA NEGATIVE NEGATIVE mg/dL   Hgb urine dipstick NEGATIVE NEGATIVE   Bilirubin Urine NEGATIVE NEGATIVE   Ketones, ur NEGATIVE NEGATIVE mg/dL   Protein, ur NEGATIVE NEGATIVE mg/dL   Nitrite NEGATIVE NEGATIVE   Leukocytes,Ua TRACE (A) NEGATIVE   RBC / HPF 0-5 0 - 5 RBC/hpf   WBC, UA 11-20 0 - 5 WBC/hpf   Bacteria, UA RARE (A) NONE SEEN   Squamous Epithelial / HPF 11-20 0 - 5 /HPF   Mucus PRESENT    Budding Yeast PRESENT    Hyaline Casts, UA PRESENT     Comment: Performed at [facility], [address] [place], [place] [address]  Rapid urine drug screen (hospital  performed)     Status: Abnormal   Collection Time: 05/07/23  7:10 PM  Result Value Ref Range   Opiates NONE DETECTED NONE DETECTED   Cocaine NONE DETECTED NONE DETECTED   Benzodiazepines POSITIVE (A) NONE DETECTED   Amphetamines NONE DETECTED NONE DETECTED   Tetrahydrocannabinol POSITIVE (A) NONE DETECTED   Barbiturates NONE DETECTED NONE DETECTED  Comment: (NOTE) DRUG SCREEN FOR MEDICAL PURPOSES ONLY.  IF CONFIRMATION IS NEEDED FOR ANY PURPOSE, NOTIFY LAB WITHIN 5 DAYS.  LOWEST DETECTABLE LIMITS FOR URINE DRUG SCREEN Drug Class                     Cutoff (ng/mL) Amphetamine and metabolites    1000 Barbiturate and metabolites    200 Benzodiazepine                 200 Opiates and metabolites        300 Cocaine and metabolites        300 THC                            50 Performed at [facility], [address], [place], [place] [address]     Current Facility-Administered Medications  Medication Dose Route Frequency Provider Last Rate Last Admin   acetaminophen (TYLENOL) tablet 650 mg  650 mg Oral Q6H PRN Shon Baton, MD   650 mg at 05/09/23 0451   bacitracin ointment   Topical BID Pricilla Loveless, MD   2 Application at 05/09/23 6837   benztropine mesylate (COGENTIN) injection 0.5 mg  0.5 mg Intramuscular BID Terald Sleeper, MD   0.5 mg at 05/09/23 2902   cariprazine (VRAYLAR) capsule 1.5 mg  1.5 mg Oral Daily Terald Sleeper, MD   1.5 mg at 05/09/23 1115   hydrOXYzine (ATARAX) tablet 25 mg  25 mg Oral TID PRN Terald Sleeper, MD   25 mg at 05/09/23 0915   loperamide (IMODIUM) capsule 2 mg  2 mg Oral PRN Gerhard Munch, MD   2 mg at 05/08/23 1457   OLANZapine zydis (ZYPREXA) disintegrating tablet 5 mg  5 mg Oral Q8H PRN Motley-Mangrum, Jadeka A, PMHNP       potassium chloride SA (KLOR-CON M) CR tablet 40 mEq  40 mEq Oral BID Pricilla Loveless, MD   40 mEq at 05/09/23 0914   risperiDONE (RISPERDAL) tablet 1 mg  1 mg Oral Daily Terald Sleeper,  MD   1 mg at 05/09/23 0914   risperiDONE (RISPERDAL) tablet 3 mg  3 mg Oral QHS Terald Sleeper, MD       traZODone (DESYREL) tablet 50 mg  50 mg Oral QHS Terald Sleeper, MD       Current Outpatient Medications  Medication Sig Dispense Refill   benztropine (COGENTIN) 1 MG tablet Take 1 tablet (1 mg total) by mouth daily. 30 tablet 0   amoxicillin-clavulanate (AUGMENTIN) 875-125 MG tablet Take 1 tablet by mouth 2 (two) times daily. (Patient not taking: Reported on 05/06/2023) 20 tablet 0   bacitracin ointment Apply topically 2 (two) times daily. 120 g 0   [START ON 05/10/2023] cariprazine (VRAYLAR) 1.5 MG capsule Take 1 capsule (1.5 mg total) by mouth daily. 30 capsule 0   hydrOXYzine (ATARAX) 25 MG tablet Take 1 tablet (25 mg total) by mouth 3 (three) times daily as needed for anxiety. 30 tablet 0   [START ON 05/10/2023] risperiDONE (RISPERDAL) 1 MG tablet Take 1 tablet (1 mg total) by mouth daily. 30 tablet 0   risperiDONE (RISPERDAL) 3 MG tablet Take 1 tablet (3 mg total) by mouth at bedtime. 30 tablet 0   traZODone (DESYREL) 50 MG tablet Take 1 tablet (50 mg total) by mouth at bedtime as needed for up to 15 days for sleep. 15 tablet 0  Musculoskeletal: Strength & Muscle Tone: within normal limits Gait & Station: normal Patient leans: Front   Psychiatric Specialty Exam: Presentation  General Appearance:  Neat; Casual  Eye Contact: None  Speech: Clear and Coherent; Pressured  Speech Volume: Normal  Handedness: Right   Mood and Affect  Mood: Angry; Irritable; Labile  Affect: Congruent; Labile   Thought Process  Thought Processes: Coherent; Linear  Descriptions of Associations:Intact  Orientation:Full (Time, Place and Person)  Thought Content:Logical; Illogical  History of Schizophrenia/Schizoaffective disorder:No  Duration of Psychotic Symptoms:N/A  Hallucinations:Hallucinations: None  Ideas of Reference:None  Suicidal Thoughts:Suicidal Thoughts:  No  Homicidal Thoughts:Homicidal Thoughts: No   Sensorium  Memory: Immediate Good; Remote Poor; Recent Fair  Judgment: Impaired  Insight: Lacking   Executive Functions  Concentration: Fair  Attention Span: Fair  Recall: [employer] of Knowledge: Fair  Language: Fair   Psychomotor Activity  Psychomotor Activity: Psychomotor Activity: Normal   Assets  Assets: Communication Skills    Sleep  Sleep: Sleep: Good   Physical Exam: Physical Exam Vitals and nursing note reviewed.  Constitutional:      Appearance: Normal appearance.  HENT:     Head: Normocephalic.     Nose: Nose normal.  Cardiovascular:     Rate and Rhythm: Normal rate and regular rhythm.  Pulmonary:     Effort: Pulmonary effort is normal.  Musculoskeletal:        General: Normal range of motion.     Cervical back: Normal range of motion.  Skin:    General: Skin is warm and dry.  Neurological:     Mental Status: She is alert and oriented to person, place, and time.  Psychiatric:        Attention and Perception: Attention normal.        Mood and Affect: Mood is anxious and depressed. Affect is labile and angry.        Speech: Speech normal.        Behavior: Behavior is uncooperative and agitated.        Thought Content: Thought content includes suicidal ideation.        Cognition and Memory: Cognition and memory normal.        Judgment: Judgment is impulsive and inappropriate.    Review of Systems  Constitutional: Negative.   HENT: Negative.    Eyes: Negative.   Respiratory: Negative.    Cardiovascular: Negative.   Gastrointestinal: Negative.   Genitourinary: Negative.   Musculoskeletal: Negative.   Skin: Negative.   Neurological: Negative.   Endo/Heme/Allergies: Negative.   Psychiatric/Behavioral:  Positive for depression, substance abuse and suicidal ideas. The patient is nervous/anxious.    Blood pressure 118/75, pulse (!) 58, temperature 99 F (37.2 C), resp. rate  17, height 5\' 7"  (1.702 m), weight 68 kg, last menstrual period 04/24/2023, SpO2 99 %. Body mass index is 23.49 kg/m.  Medical Decision Making: Based on her behavior this morning after hearing that mother will not take him in after discharge patient proved she is not safe to go back to the community.  We will seek inpatient Mental healthcare for patient for her safety and stabilization.  Records will be faxed to hospitals with available bed.  She will continue taking her medications while we look for bed.  Problem 1: Bipolar 1 disorder,current episode Depressed.  Disposition:  Admit, seek bed placement  Earney Navy, NP-PMHNP-BC 05/09/2023 5:47 PM

## 2023-05-09 NOTE — ED Notes (Signed)
Patient agitated and calling for nurse. Patient confused about why she is here and what the plan is. She wants to know why she hasn't been moved yet. Informed patient of circumstances that brought her here. Informed patient the doctors would be around this AM. Patient resting back in bed.

## 2023-05-09 NOTE — Consult Note (Signed)
Patient was discharged ho but called mother to pick her up.  Mother informed her that she was not coming back to her home.  Mother called and spoke with Provider.  Provider went to talk to patient about shelter and reinforce mother's instruction not to come back home.  Patient got up and wrapped bed sheet around her neck  and walked towords provider in an angry tone to "just leave this room now" Patient proceeded to walk out the unit with the sheet around her neck.   Patient is again being under IVC which is in process.  We will readmit and seek bed placement.

## 2023-05-09 NOTE — Discharge Summary (Signed)
[facility] Psych ED Discharge  05/09/2023 10:18 AM Ana Best  MRN:  [id number]  Principal Problem: Bipolar I disorder, current or most recent episode manic, severe (HCC) Discharge Diagnoses: Principal Problem:   Bipolar I disorder, current or most recent episode manic, severe (HCC) Active Problems:   Substance induced mood disorder (HCC)  Clinical Impression:  Final diagnoses:  Psychosis, unspecified psychosis type (HCC)   Subjective: Ana Best is a 37 y.o. female patient admitted to [facility], patient  BIBA from street. Police were picking pt up to IVC them. Pt was incoherent, and naked. Took several [employer] and EMS to restrain them. Patient was medicated with  400 mg of ketamine reroute to the ER.  Patient was seen for discharge this morning alert, calm and cooperative.  Patient engaged in meaningful conversation.  He reports that her mood is improve, states she is not angry at this time.  She admitted she was walking the high way looking for her dog.  She also admitted using Cannabis at the time she was seen at the highway.  Patient states she has a Therapist, sports in the community and that she was seen two months ago.  Patient plans to call for an appointment.  She arrival to the ER she has been taking her medications.  She has been eating and drinking fluids and tolerating all.  She was Medicated for a bout of diarrhea but states this morning she is doing well.  Patient denies SI/HI/AVH and no mention of paranoia.  Patient was advised to seek substance abuse treatment in the outpatient setting.  We reviewed the danger associated with using illicit drugs.  Patient verbalizes understanding. Patient reports good sleep since arriving to the ER and she has agreed that she does not need to use Cannabis anymore.  She says she has had time here to think of her using Cannabis and plan to stop but declined outpatient substance abuse care.  Patient is being discharged on Risperidone, Vraylar, Hydroxyzine and  Benztropine.  Patient promises to continue taking her medications at home.   Provider and patient reviewed safety plans-call [phone] or [phone] for Mental health crisis including but not limited to suicide ideation or thought.  Go to [facility] health facility for mental health Crisis.  Patient verbalizes understanding.  Patient is Psychiatrically cleared.  ED Assessment Time Calculation: Start Time: 0929 Stop Time: 1000 Total Time in Minutes (Assessment Completion): 31   Past Psychiatric History: see initial Psychiatry evaluation note  Past Medical History:  Past Medical History:  Diagnosis Date   Anxiety    Bipolar 1 disorder (HCC)    Depression    Hypertension     Past Surgical History:  Procedure Laterality Date   DILATION AND CURETTAGE OF UTERUS     TONSILLECTOMY     WISDOM TOOTH EXTRACTION     Family History:  Family History  Problem Relation Age of Onset   Depression Father    Anxiety disorder Father    ADD / ADHD Brother    Family Psychiatric  History: see initial Psychiatry evaluation note Social History:  Social History   Substance and Sexual Activity  Alcohol Use Yes   Comment: Pt denies substance use     Social History   Substance and Sexual Activity  Drug Use Yes   Types: Marijuana   Comment: Pt denies substance use    Social History   Socioeconomic History   Marital status: Single    Spouse name: Not on file  Number of children: Not on file   Years of education: Not on file   Highest education level: Not on file  Occupational History   Not on file  Tobacco Use   Smoking status: Former    Packs/day: 0.50    Years: 10.00    Additional pack years: 0.00    Total pack years: 5.00    Types: Cigarettes    Quit date: 03/08/2014    Years since quitting: 9.1   Smokeless tobacco: Never  Vaping Use   Vaping Use: Never used  Substance and Sexual Activity   Alcohol use: Yes    Comment: Pt denies substance use   Drug use: Yes    Types:  Marijuana    Comment: Pt denies substance use   Sexual activity: Yes    Partners: Male    Comment: number of sex partners in the last 12 months  1  Other Topics Concern   Not on file  Social History Narrative   ** Merged History Encounter **       Social Determinants of Health   Financial Resource Strain: Not on file  Food Insecurity: No Food Insecurity (09/23/2022)   Hunger Vital Sign    Worried About Running Out of Food in the Last Year: Never true    Ran Out of Food in the Last Year: Never true  Transportation Needs: No Transportation Needs (09/23/2022)   PRAPARE - Administrator, Civil Service (Medical): No    Lack of Transportation (Non-Medical): No  Physical Activity: Not on file  Stress: Not on file  Social Connections: Not on file    Tobacco Cessation:  N/A, patient does not currently use tobacco products  Current Medications: Current Facility-Administered Medications  Medication Dose Route Frequency Provider Last Rate Last Admin   acetaminophen (TYLENOL) tablet 650 mg  650 mg Oral Q6H PRN Shon Baton, MD   650 mg at 05/09/23 0451   bacitracin ointment   Topical BID Pricilla Loveless, MD   2 Application at 05/09/23 1610   benztropine mesylate (COGENTIN) injection 0.5 mg  0.5 mg Intramuscular BID Terald Sleeper, MD   0.5 mg at 05/09/23 9604   cariprazine (VRAYLAR) capsule 1.5 mg  1.5 mg Oral Daily Terald Sleeper, MD   1.5 mg at 05/09/23 5409   hydrOXYzine (ATARAX) tablet 25 mg  25 mg Oral TID PRN Terald Sleeper, MD   25 mg at 05/09/23 0915   loperamide (IMODIUM) capsule 2 mg  2 mg Oral PRN Gerhard Munch, MD   2 mg at 05/08/23 1457   OLANZapine zydis (ZYPREXA) disintegrating tablet 5 mg  5 mg Oral Q8H PRN Motley-Mangrum, Jadeka A, PMHNP       potassium chloride SA (KLOR-CON M) CR tablet 40 mEq  40 mEq Oral BID Pricilla Loveless, MD   40 mEq at 05/09/23 0914   risperiDONE (RISPERDAL) tablet 1 mg  1 mg Oral Daily Terald Sleeper, MD   1 mg at  05/09/23 0914   risperiDONE (RISPERDAL) tablet 3 mg  3 mg Oral QHS Terald Sleeper, MD       traZODone (DESYREL) tablet 50 mg  50 mg Oral QHS Terald Sleeper, MD       Current Outpatient Medications  Medication Sig Dispense Refill   benztropine (COGENTIN) 1 MG tablet Take 1 tablet (1 mg total) by mouth daily. 30 tablet 0   amoxicillin-clavulanate (AUGMENTIN) 875-125 MG tablet Take 1 tablet by mouth 2 (two)  times daily. (Patient not taking: Reported on 05/06/2023) 20 tablet 0   bacitracin ointment Apply topically 2 (two) times daily. 120 g 0   [START ON 05/10/2023] cariprazine (VRAYLAR) 1.5 MG capsule Take 1 capsule (1.5 mg total) by mouth daily. 30 capsule 0   hydrOXYzine (ATARAX) 25 MG tablet Take 1 tablet (25 mg total) by mouth 3 (three) times daily as needed for anxiety. 30 tablet 0   [START ON 05/10/2023] risperiDONE (RISPERDAL) 1 MG tablet Take 1 tablet (1 mg total) by mouth daily. 30 tablet 0   risperiDONE (RISPERDAL) 3 MG tablet Take 1 tablet (3 mg total) by mouth at bedtime. 30 tablet 0   traZODone (DESYREL) 50 MG tablet Take 1 tablet (50 mg total) by mouth at bedtime as needed for up to 15 days for sleep. 15 tablet 0   PTA Medications: (Not in a hospital admission)   [place] Scale:  Flowsheet Row ED from [facility]06[facility]20[facility] at [facility] ED from 04[facility]25/2023 [facility] Admission (Discharged) [facility]  C-SSRS RISK CATEGORY No Risk No Risk No Risk       Musculoskeletal: Strength & Muscle Tone: within normal limits Gait & Station: normal Patient leans: Front  Psychiatric Specialty Exam: Presentation  General Appearance:  Casual; Neat  Eye Contact: Good  Speech: Clear and Coherent; Normal Rate  Speech Volume: Normal  Handedness: Right   Mood and Affect  Mood: Anxious (Reports mood has improved.)  Affect: Congruent   Thought Process   Thought Processes: Coherent; Goal Directed; Linear  Descriptions of Associations:Intact  Orientation:Full (Time, Place and Person)  Thought Content:Logical  History of Schizophrenia/Schizoaffective disorder:No  Duration of Psychotic Symptoms:N/A  Hallucinations:Hallucinations: None  Ideas of Reference:None  Suicidal Thoughts:Suicidal Thoughts: No  Homicidal Thoughts:Homicidal Thoughts: No   Sensorium  Memory: Other (comment) (unable to assess)  Judgment: Good  Insight: Good   Executive Functions  Concentration: Good  Attention Span: Good  Recall: Fair  Fund of Knowledge: Good  Language: Good   Psychomotor Activity  Psychomotor Activity: Psychomotor Activity: Normal   Assets  Assets: Communication Skills; Social Support; Physical Health   Sleep  Sleep: Sleep: Good    Physical Exam: Physical Exam Vitals and nursing note reviewed.  Constitutional:      Appearance: Normal appearance.  HENT:     Head: Normocephalic and atraumatic.     Nose: Nose normal.  Cardiovascular:     Rate and Rhythm: Bradycardia present.  Pulmonary:     Effort: Pulmonary effort is normal.  Musculoskeletal:        General: Normal range of motion.     Cervical back: Normal range of motion.  Skin:    General: Skin is warm and dry.  Neurological:     General: No focal deficit present.     Mental Status: She is alert and oriented to person, place, and time.  Psychiatric:        Attention and Perception: Attention and perception normal.        Mood and Affect: Mood and affect normal.        Speech: Speech normal.        Behavior: Behavior normal. Behavior is cooperative.        Thought Content: Thought content normal.        Cognition and Memory: Cognition and memory normal.        Judgment: Judgment normal.  Review of Systems  Constitutional: Negative.   HENT: Negative.    Eyes: Negative.   Respiratory: Negative.    Cardiovascular: Negative.    Gastrointestinal: Negative.   Genitourinary: Negative.   Musculoskeletal: Negative.   Skin: Negative.   Neurological: Negative.   Endo/Heme/Allergies: Negative.   Psychiatric/Behavioral:  Positive for depression and substance abuse.    Blood pressure 118/75, pulse (!) 58, temperature 99 F (37.2 C), resp. rate 17, height 5\' 7"  (1.702 m), weight 68 kg, last menstrual period 04/24/2023, SpO2 99 %. Body mass index is 23.49 kg/m.   Demographic Factors:  Adolescent or young adult, Divorced or widowed, Caucasian, and Low socioeconomic status  Loss Factors: NA  Historical Factors: Impulsivity  Risk Reduction Factors:   Responsible for children under 24 years of age  Continued Clinical Symptoms:  Bipolar Disorder:   Depressive phase Depression:   Comorbid alcohol abuse/dependence Impulsivity Alcohol/Substance Abuse/Dependencies Previous Psychiatric Diagnoses and Treatments  Cognitive Features That Contribute To Risk:  None    Suicide Risk:  Minimal: No identifiable suicidal ideation.  Patients presenting with no risk factors but with morbid ruminations; may be classified as minimal risk based on the severity of the depressive symptoms    Plan Of Care/Follow-up recommendations:  Activity:  as tolerated Diet:  Regular  Medical Decision Making: Patient is calm, cooperative and engaged in meaningful conversation.  Patient denies SI/HI/AVH.  Patient also admits that the use of Cannabis is affecting her mood and and cognition.  She plans to stop using illicit drugs.  Patient will go back to seeing her psychiatry provider and remain on her medications she promises.  Patient is Psychiatrically cleared.  Disposition: Psychiatrically cleared. Earney Navy, NP   PMHNP-BC 05/09/2023, 10:18 AM

## 2023-05-09 NOTE — Progress Notes (Signed)
LCSW Progress Note  [id number]   Ana Best  05/09/2023  1:25 PM  Description:   Inpatient Psychiatric Referral  Patient was recommended inpatient per Dahlia Byes, NP. There are no available beds at [facility], per [facility] Rona Ravens, RN. Patient was referred to the following out of network facilities:   [facility] Provider Address Phone Fax  [facility]  [address] [place] [address] [phone]  [facility]  [address] [place] [address] [phone]  [facility]  [address], [place] [address] [phone]  [facility]  [address] [place], [place] [address] [phone]  [facility] [address] [place]  [address] [place] [address] [phone]  [facility]  [address] [place], [place] [address] [phone]  [facility]  [address] [place] [address] [phone]  [facility]  [address] [place] [address] [phone]  [facility]  [address] [place] [address] [phone]  [facility]  [address] [place], [place] [address] [phone]  [facility]  [address] [place] [address] [phone]  [facility]  [address] [place] [address] [phone]  [facility]  [address] [place] [address] [phone]  [facility]  [address], [place] [address] [phone]  [facility]  [address], [place] [address] [phone]  [facility]  [address]  [place] [address] [phone]  [facility]  [address] [place] [address] [phone]  [facility]  [address] [place] [address] [phone]  [facility]  [address] [place] [address] [phone]  [facility]  [address] [place] [address] [phone]  [facility]  [address] [place] [address] [phone]  [facility]  [address], [place] [address] [phone]  [facility]  [address] [place] [address] [phone]  [facility]  [address] [place] [address] [phone]  [facility]  [address] [place] [address] [phone]    Situation ongoing, CSW to continue following and update chart as more information becomes available.      Cathie Beams, LCSW  05/09/2023 1:25 PM

## 2023-05-09 NOTE — ED Provider Notes (Addendum)
Emergency Medicine Observation Re-evaluation Note  Ana Best is a 37 y.o. female, seen on rounds today.  Pt initially presented to the ED for complaints of IVC and Agitation Currently, the patient is resting.  Physical Exam  BP 118/75 (BP Location: Right Arm)   Pulse (!) 58   Temp 99 F (37.2 C)   Resp 17   Ht 5\' 7"  (1.702 m)   Wt 68 kg   LMP 04/24/2023   SpO2 99%   BMI 23.49 kg/m  Physical Exam General: no acute distress Lungs: normal effort Psych: no agitation MSK: mild hand abrasions  ED Course / MDM  EKG:EKG Interpretation  Date/Time:  Saturday May 06 2023 13:40:09 EDT Ventricular Rate:  88 PR Interval:  138 QRS Duration: 78 QT Interval:  296 QTC Calculation: 358 R Axis:   74 Text Interpretation: Sinus rhythm Nonspecific repol abnormality, diffuse leads Confirmed by Alvester Chou [id number]) on 05/06/2023 2:15:35 PM  I have reviewed the labs performed to date as well as medications administered while in observation.  Recent changes in the last 24 hours include potassium replacement.  Plan  Current plan is for inpatient psychiatric admission.    Pricilla Loveless, MD 05/09/23 [phone]   11:51 AM Psychiatry had cleared patient, but then mom notes she's a danger to herself and the psychiatry NP witnessed the patient trying to choke herself with a bed sheet. Psychiatry will re-IVC. Pricilla Loveless, MD 05/09/23 4098    Pricilla Loveless, MD 05/09/23 [phone]

## 2023-05-09 NOTE — ED Notes (Signed)
Patient has remained in personal room resting all day. She was compliant to meals and medications. Patient did have less outbursts today and was more willing to communicate with staff she spoke to Psych NP and verbalized understanding of care plan. Currently in personal room, rise and chest of fall noticed, call light within reach.

## 2023-05-10 MED ORDER — LORAZEPAM 1 MG PO TABS
1.0000 mg | ORAL_TABLET | Freq: Once | ORAL | Status: AC
  Administered 2023-05-10: 1 mg via ORAL
  Filled 2023-05-10: qty 1

## 2023-05-10 MED ORDER — HYDROXYZINE HCL 25 MG PO TABS
50.0000 mg | ORAL_TABLET | Freq: Three times a day (TID) | ORAL | Status: DC | PRN
  Administered 2023-05-11: 50 mg via ORAL
  Filled 2023-05-10: qty 2

## 2023-05-10 NOTE — ED Notes (Signed)
Patient refused all her medicine tonight. Patient stated don't want to take anything" . WIll continue to monitor.

## 2023-05-10 NOTE — ED Provider Notes (Signed)
Emergency Medicine Observation Re-evaluation Note  Ana Best is a 37 y.o. female, seen on rounds today.  Pt initially presented to the ED for complaints of IVC and Agitation Currently, the patient is lying in bed in no acute distress.  Physical Exam  BP 107/65 (BP Location: Right Arm)   Pulse 77   Temp 98.6 F (37 C) (Oral)   Resp 14   Ht 1.702 m (5\' 7" )   Wt 68 kg   LMP 04/24/2023   SpO2 99%   BMI 23.49 kg/m  Physical Exam   ED Course / MDM  EKG:EKG Interpretation  Date/Time:  Saturday May 06 2023 13:40:09 EDT Ventricular Rate:  88 PR Interval:  138 QRS Duration: 78 QT Interval:  296 QTC Calculation: 358 R Axis:   74 Text Interpretation: Sinus rhythm Nonspecific repol abnormality, diffuse leads Confirmed by Alvester Chou [phone]) on 05/06/2023 2:15:35 PM  I have reviewed the labs performed to date as well as medications administered while in observation.  Recent changes in the last 24 hours include patient not wanting to be discharged.  Patient became agitated.  Was placed under IVC by psychiatry..  Plan  Current plan is for psychiatric hospitalization.    Lorre Nick, MD 05/10/23 [phone]

## 2023-05-10 NOTE — ED Notes (Addendum)
Patient has been asleep since 1900. Pt has one on one sitter. Patient is sleeping hard this nurse will hold medications as of now. Even and unlabored rise and fall of chest.

## 2023-05-10 NOTE — BH Assessment (Addendum)
Disposition Note:  Ana Best, PMHNP, recommended inpatient treatment. Clinician informed [name], RN, the [facility], regarding the patient's bed needs and inquired about availability on the [facility] unit. Currently awaiting further information.   Patient also refaxed to the following hospitals for consideration of bed placement.   Destination  Service Provider Request Status Selected Services Address Phone Fax Patient Preferred  [facility]  Pending - Request Sent N/A [address], [place] [address] [phone] --  [facility]  Pending - Request Sent N/A [address] [place] [address] [phone] --  [facility]  Pending - Request Sent N/A [address], [place] [address] [phone] --  [facility]  Pending - Request Sent N/A [address] [place], [place] [address] [phone] --  [facility]  Pending - Request Sent N/A [address], [place] [address] [phone] --  [facility]  Pending - Request Sent N/A [address] [place], [place] [address] [phone] --  [facility]  Pending - Request Sent N/A [address] [place] [address] [phone] --  [facility]  Pending - Request Sent N/A [address] [place] [address] [phone] --  [facility]  Pending - Request Sent N/A [phone] [place] [address] [phone] --  [facility]  Pending - Request Sent N/A [address] [place], [place] [address] [phone] --  [facility]  Pending - Request Sent N/A [address] [place] [address] [phone] --  [facility]  Pending - Request Sent N/A  [address] [place] [address] [phone] --  [facility]  Pending - Request Sent N/A [address], [place] [address] [phone] --  [facility]  Pending - Request Sent N/A [address] [place] [address] [phone] --  [facility]  Pending - Request Sent N/A [address], [place] [address] [phone] --  [facility]  Pending - Request Sent N/A [address] [place] [address] [phone] --  [facility]  Pending - Request Sent N/A [address] [place] [address] [phone] --  [facility]  Pending - Request Sent N/A [address] [place] [address] [phone] --  [facility]  Pending - Request Sent N/A [address], [place] [address] [phone] --  [facility]  Pending - Request Sent N/A [address] [place] [address] [phone] --  [facility]  Pending - Request Sent N/A [address] [place] [address] [phone] --  [facility]  Pending - Request Sent N/A [address] [place] [address] [phone] --  [facility]  Pending - Request Sent N/A [address] [place], [place] [address] [phone] --  [facility]  Pending - Request Sent N/A [address] [place] [address] [phone] --  [facility]  Pending - Request Sent N/A [address], [place] [address] [phone] --

## 2023-05-10 NOTE — ED Notes (Signed)
Called to give report, informed that Night shift nurse will be available to take report after 1930

## 2023-05-10 NOTE — Progress Notes (Signed)
Ana Best  05/10/2023 3:09 PM Ana Best  MRN:  [id number]   Principal Problem: Major depressive disorder, recurrent, severe without psychotic features (HCC) Diagnosis:  Principal Problem:   Major depressive disorder, recurrent, severe without psychotic features (HCC) Active Problems:   Substance induced mood disorder Ana Best)   ED Assessment Time Calculation: Start Time: 1445 Stop Time: 1510 Total Time in Minutes (Assessment Completion): 25   Subjective:  On evaluation today, the patient is laying bed, watching television, in no acute distress. She is calm and cooperative during this assessment. She is alert and oriented x4 to person, place, time, and situation. Her appearance is appropriate for environment. Her eye contact is good. Speech is clear and coherent, normal pace and normal volume. She reports her mood is "good". Affect is congruent with mood.  Thought process is coherent and within normal limits. Thought content is coherent.  She denies auditory and visual hallucinations. No indication that she is responding to internal stimuli during this assessment.  No delusions elicited during this assessment.  She denies suicidal ideations.  She denies homicidal ideations. Appetite and sleep are fair. Memory, judgment, and insight fair.  While speaking with patient, provider asked there if she remembered how her behavior was this past weekend in the hospital, provider told her that she was irritable toward staff and towards her mother, patient states she does not remember that and does not remember what happened yesterday that she was not able to be discharged.  Provider informed her that she wrapped a bed sheet around her neck.  Patient denies wrapping a bed sheet around her neck. She states she does not want to harm herself. Patient does not want to talk about her mother, says she does not get along with her.  After she stated she does not get along with her mother, patient  began hysterically crying, and saying she wishes their relationship was not like that.  She asked provider if she could have something for anxiety.  Provider asked patient if she were discharged where which she go, she stated that she cannot go back to her mother's house, she is unsure of where she will go at this time.  Patient sitter Ana Best states, to provider, that she was sitting with patient on yesterday 05-09-2023, all day and states that patient did not attempt to wrap her bed sheet around her neck, states patient has been wetting her hair all day, and placed a towel around her neck, so her hair could drip on the towel.  Ana Best states that she attempted to inform the providers that it was not a suicidal attempt, she states that patient was appropriate all day yesterday with no complaints.  [name] Ana Best, wrote a letter asking that patient's health team send her to [facility] in [facility], she feels that if patient is released she fears that patient is a danger to self and others in the community.  She feels that patient requires stabilization and long-term health care plan she states that patient is suffering from psychosis, with-without the use of cannabis.  Ms. Ana Best states that patient has been unable to work, and take care of her 28-year-old son for functioning in society, she states that historically patient convinces the medical team that she is stable and improved, she feels that a short-term hospital stay does not stabilize patient. She feels that if patient is discharged, she will again return to another cycle of deterioration. Letter will be  in patient chart on TCU unit.  Past Psychiatric History: Bipolar 2 disorder, Amphetamine use disorder. Previous hospitalization at [facility] and was in [facility] 2022 and 2023.   [place] Scale:  Flowsheet Row ED from 05/06/2023 in [facility] Emergency Department at [facility] ED from 04/25/2023 in  [facility] (Discharged[facility]  C-SSRS RISK CATEGORY No Risk No Risk No Risk       Past Medical History:  Past Medical History:  Diagnosis Date   Anxiety    Bipolar 1 disorder (HCC)    Depression    Hypertension     Past Surgical History:  Procedure Laterality Date   DILATION AND CURETTAGE OF UTERUS     TONSILLECTOMY     WISDOM TOOTH EXTRACTION     Family History:  Family History  Problem Relation Age of Onset   Depression Father    Anxiety disorder Father    ADD / ADHD Brother     Social History:  Social History   Substance and Sexual Activity  Alcohol Use Yes   Comment: Pt denies substance use     Social History   Substance and Sexual Activity  Drug Use Yes   Types: Marijuana   Comment: Pt denies substance use    Social History   Socioeconomic History   Marital status: Single    Spouse name: Not on file   Number of children: Not on file   Years of education: Not on file   Highest education level: Not on file  Occupational History   Not on file  Tobacco Use   Smoking status: Former    Packs/day: 0.50    Years: 10.00    Additional pack years: 0.00    Total pack years: 5.00    Types: Cigarettes    Quit date: 03/08/2014    Years since quitting: 9.1   Smokeless tobacco: Never  Vaping Use   Vaping Use: Never used  Substance and Sexual Activity   Alcohol use: Yes    Comment: Pt denies substance use   Drug use: Yes    Types: Marijuana    Comment: Pt denies substance use   Sexual activity: Yes    Partners: Male    Comment: number of sex partners in the last 12 months  1  Other Topics Concern   Not on file  Social History Narrative   ** Merged History Encounter **       Social Determinants of Health   Financial Resource Strain: Not on file  Food Insecurity: No Food Insecurity (09/23/2022)   Hunger Vital Sign    Worried About Running Out of Food in the  Last Year: Never true    Ran Out of Food in the Last Year: Never true  Transportation Needs: No Transportation Needs (09/23/2022)   PRAPARE - Administrator, Civil Service (Medical): No    Lack of Transportation (Non-Medical): No  Physical Activity: Not on file  Stress: Not on file  Social Connections: Not on file    Sleep: Good  Appetite:  Good  Current Medications: Current Facility-Administered Medications  Medication Dose Best Frequency Provider Last Rate Last Admin   acetaminophen (TYLENOL) tablet 650 mg  650 mg Oral Q6H PRN Shon Baton, MD   650 mg at 05/09/23 0451   bacitracin ointment   Topical BID Pricilla Loveless, MD   2 Application at 05/09/23 [phone]  benztropine mesylate (COGENTIN) injection 0.5 mg  0.5 mg Intramuscular BID Terald Sleeper, MD   0.5 mg at 05/09/23 1324   cariprazine (VRAYLAR) capsule 1.5 mg  1.5 mg Oral Daily Terald Sleeper, MD   1.5 mg at 05/10/23 4010   hydrOXYzine (ATARAX) tablet 50 mg  50 mg Oral TID PRN Motley-Mangrum, Geralynn Ochs A, PMHNP       loperamide (IMODIUM) capsule 2 mg  2 mg Oral PRN Gerhard Munch, MD   2 mg at 05/08/23 1457   LORazepam (ATIVAN) tablet 1 mg  1 mg Oral Once Motley-Mangrum, Ramere Downs A, PMHNP       OLANZapine zydis (ZYPREXA) disintegrating tablet 5 mg  5 mg Oral Q8H PRN Motley-Mangrum, Luan Maberry A, PMHNP       potassium chloride SA (KLOR-CON M) CR tablet 40 mEq  40 mEq Oral BID Pricilla Loveless, MD   40 mEq at 05/10/23 0915   risperiDONE (RISPERDAL) tablet 1 mg  1 mg Oral Daily Terald Sleeper, MD   1 mg at 05/10/23 0915   risperiDONE (RISPERDAL) tablet 3 mg  3 mg Oral QHS Terald Sleeper, MD       traZODone (DESYREL) tablet 50 mg  50 mg Oral QHS Terald Sleeper, MD       Current Outpatient Medications  Medication Sig Dispense Refill   benztropine (COGENTIN) 1 MG tablet Take 1 tablet (1 mg total) by mouth daily. 30 tablet 0   amoxicillin-clavulanate (AUGMENTIN) 875-125 MG tablet Take 1 tablet by mouth 2  (two) times daily. (Patient not taking: Reported on 05/06/2023) 20 tablet 0   bacitracin ointment Apply topically 2 (two) times daily. 120 g 0   cariprazine (VRAYLAR) 1.5 MG capsule Take 1 capsule (1.5 mg total) by mouth daily. 30 capsule 0   hydrOXYzine (ATARAX) 25 MG tablet Take 1 tablet (25 mg total) by mouth 3 (three) times daily as needed for anxiety. 30 tablet 0   risperiDONE (RISPERDAL) 1 MG tablet Take 1 tablet (1 mg total) by mouth daily. 30 tablet 0   risperiDONE (RISPERDAL) 3 MG tablet Take 1 tablet (3 mg total) by mouth at bedtime. 30 tablet 0   traZODone (DESYREL) 50 MG tablet Take 1 tablet (50 mg total) by mouth at bedtime as needed for up to 15 days for sleep. 15 tablet 0    Lab Results: No results found for this or any previous visit (from the past 48 hour(s)).  Blood Alcohol level:  Lab Results  Component Value Date   ETH <10 05/06/2023   ETH <10 09/20/2022    Physical Findings:  CIWA:    COWS:     Musculoskeletal: Strength & Muscle Tone: within normal limits Gait & Station: normal Patient leans: N/A  Psychiatric Specialty Exam:  Presentation  General Appearance:  Appropriate for Environment  Eye Contact: Good  Speech: Clear and Coherent  Speech Volume: Normal  Handedness: Right   Mood and Affect  Mood: Euthymic  Affect: Appropriate   Thought Process  Thought Processes: Coherent  Descriptions of Associations:Intact  Orientation:Full (Time, Place and Person)  Thought Content:Logical  History of Schizophrenia/Schizoaffective disorder:No  Duration of Psychotic Symptoms:N/A  Hallucinations:Hallucinations: None  Ideas of Reference:None  Suicidal Thoughts:Suicidal Thoughts: No  Homicidal Thoughts:Homicidal Thoughts: No   Sensorium  Memory: Immediate Good  Judgment: Fair  Insight: Lacking   Executive Functions  Concentration: Fair  Attention Span: Fair  Recall: Fair  Fund of  Knowledge: Fair  Language: Fair   Psychomotor Activity  Psychomotor Activity: Psychomotor Activity: Normal   Assets  Assets: Communication Skills; Social Support   Sleep  Sleep: Sleep: Good    Physical Exam: Physical Exam Vitals and nursing Best reviewed. Exam conducted with a chaperone present.  Musculoskeletal:        General: Normal range of motion.  Neurological:     Mental Status: She is alert.  Psychiatric:        Attention and Perception: Attention normal.        Mood and Affect: Mood is anxious.        Speech: Speech normal.        Behavior: Behavior is cooperative.        Thought Content: Thought content normal.        Judgment: Judgment is impulsive and inappropriate.    Review of Systems  Constitutional: Negative.   Psychiatric/Behavioral:  Positive for substance abuse.    Blood pressure (!) 89/55, pulse 62, temperature 98.9 F (37.2 C), temperature source Oral, resp. rate 18, height 5\' 7"  (1.702 m), weight 68 kg, last menstrual period 04/24/2023, SpO2 98 %. Body mass index is 23.49 kg/m.    Medical Decision Making: Patient continues to require inpatient Psychiatric hospitalization. Patient mother feels that patient is a danger to self and others in the community.     Rendon Howell MOTLEY-MANGRUM, PMHNP 05/10/2023, 3:09 PM

## 2023-05-10 NOTE — ED Notes (Signed)
Pt was accepted to [facility] Address: [address] [place], [place] [address] TODAY 05/10/2023; Bed assignment: 325  PENDING IVC paperwork faxed to [facility] [phone]   Per [facility] nurse Boris Lown unit secretary to assist with faxing IVC paperwork.  Pt meets inpatient criteria per Alona Bene, PMHNP  Attending Physician will be Mordecai Rasmussen, MD  Report can be called to: [phone]  Pt can arrive tonight: [facility] and Charge [facility] RN will coordinate.

## 2023-05-10 NOTE — ED Notes (Signed)
Per [place]  he is unable to take patient tonight.

## 2023-05-10 NOTE — ED Notes (Signed)
Patient is still asleep. This nurse will continue to check on and monitor patient.

## 2023-05-10 NOTE — Progress Notes (Signed)
Pt was accepted to [facility] Address: [address] [place], [place] [address] TODAY 05/10/2023; Bed assignment: 325  PENDING IVC paperwork faxed to [facility] [phone]   Per [facility] nurse Boris Lown unit secretary to assist with faxing IVC paperwork.  Pt meets inpatient criteria per Alona Bene, PMHNP  Attending Physician will be Mordecai Rasmussen, MD  Report can be called to: [phone]  Pt can arrive tonight: [facility] and Charge [facility] RN will coordinate.    Care Team notified: Day [facility] Rona Ravens, RN, Alona Bene, PMHNP, Arsenio Loader, NP, Dr. Glo Herring, Althea Charon, RN, Melynda Ripple, Counselor, Gigi Brunilda Payor

## 2023-05-11 ENCOUNTER — Inpatient Hospital Stay
Admission: RE | Admit: 2023-05-11 | Discharge: 2023-05-17 | DRG: 885 | Disposition: A | Discharge status: Home / Self Care | Payer: Medicaid Other | Source: Intra-hospital | Attending: Psychiatry | Admitting: Psychiatry

## 2023-05-11 ENCOUNTER — Encounter: Payer: Self-pay | Admitting: Psychiatry

## 2023-05-11 ENCOUNTER — Other Ambulatory Visit: Payer: Self-pay

## 2023-05-11 DIAGNOSIS — F17200 Nicotine dependence, unspecified, uncomplicated: Secondary | ICD-10-CM | POA: Diagnosis present

## 2023-05-11 DIAGNOSIS — Z5941 Food insecurity: Secondary | ICD-10-CM | POA: Diagnosis not present

## 2023-05-11 DIAGNOSIS — F909 Attention-deficit hyperactivity disorder, unspecified type: Secondary | ICD-10-CM | POA: Diagnosis present

## 2023-05-11 DIAGNOSIS — F410 Panic disorder [episodic paroxysmal anxiety] without agoraphobia: Secondary | ICD-10-CM | POA: Diagnosis present

## 2023-05-11 DIAGNOSIS — F319 Bipolar disorder, unspecified: Principal | ICD-10-CM | POA: Diagnosis present

## 2023-05-11 DIAGNOSIS — F1210 Cannabis abuse, uncomplicated: Secondary | ICD-10-CM | POA: Diagnosis present

## 2023-05-11 DIAGNOSIS — I10 Essential (primary) hypertension: Secondary | ICD-10-CM | POA: Diagnosis present

## 2023-05-11 DIAGNOSIS — H6693 Otitis media, unspecified, bilateral: Secondary | ICD-10-CM | POA: Insufficient documentation

## 2023-05-11 DIAGNOSIS — Z79899 Other long term (current) drug therapy: Secondary | ICD-10-CM

## 2023-05-11 DIAGNOSIS — F1010 Alcohol abuse, uncomplicated: Secondary | ICD-10-CM | POA: Diagnosis present

## 2023-05-11 DIAGNOSIS — Z818 Family history of other mental and behavioral disorders: Secondary | ICD-10-CM

## 2023-05-11 DIAGNOSIS — H6093 Unspecified otitis externa, bilateral: Secondary | ICD-10-CM | POA: Diagnosis not present

## 2023-05-11 DIAGNOSIS — F29 Unspecified psychosis not due to a substance or known physiological condition: Secondary | ICD-10-CM | POA: Diagnosis not present

## 2023-05-11 MED ORDER — TRAZODONE HCL 50 MG PO TABS
50.0000 mg | ORAL_TABLET | Freq: Every day | ORAL | Status: DC
  Administered 2023-05-11: 50 mg via ORAL
  Filled 2023-05-11: qty 1

## 2023-05-11 MED ORDER — DIPHENHYDRAMINE HCL 25 MG PO CAPS
50.0000 mg | ORAL_CAPSULE | Freq: Three times a day (TID) | ORAL | Status: DC | PRN
  Administered 2023-05-13 – 2023-05-14 (×2): 50 mg via ORAL
  Filled 2023-05-11 (×2): qty 2

## 2023-05-11 MED ORDER — RISPERIDONE 1 MG PO TABS: 3.0000 mg | ORAL_TABLET | Freq: Every day | ORAL | Status: DC

## 2023-05-11 MED ORDER — LORAZEPAM 2 MG/ML IJ SOLN: 2.0000 mg | Freq: Three times a day (TID) | INTRAMUSCULAR | Status: DC | PRN

## 2023-05-11 MED ORDER — CARIPRAZINE HCL 1.5 MG PO CAPS: 1.5000 mg | ORAL_CAPSULE | Freq: Every day | ORAL | Status: DC

## 2023-05-11 MED ORDER — ACETAMINOPHEN 325 MG PO TABS
650.0000 mg | ORAL_TABLET | Freq: Four times a day (QID) | ORAL | Status: DC | PRN
  Administered 2023-05-12 – 2023-05-13 (×2): 650 mg via ORAL
  Filled 2023-05-11 (×2): qty 2

## 2023-05-11 MED ORDER — POTASSIUM CHLORIDE CRYS ER 20 MEQ PO TBCR
40.0000 meq | EXTENDED_RELEASE_TABLET | Freq: Two times a day (BID) | ORAL | Status: AC
  Administered 2023-05-11: 40 meq via ORAL
  Filled 2023-05-11: qty 2

## 2023-05-11 MED ORDER — RISPERIDONE 1 MG PO TABS
2.0000 mg | ORAL_TABLET | Freq: Every day | ORAL | Status: DC
  Administered 2023-05-11 – 2023-05-14 (×4): 2 mg via ORAL
  Filled 2023-05-11 (×5): qty 2

## 2023-05-11 MED ORDER — DIVALPROEX SODIUM 500 MG PO DR TAB
500.0000 mg | DELAYED_RELEASE_TABLET | Freq: Two times a day (BID) | ORAL | Status: DC
  Administered 2023-05-11 – 2023-05-17 (×11): 500 mg via ORAL
  Filled 2023-05-11 (×12): qty 1

## 2023-05-11 MED ORDER — NICOTINE POLACRILEX 2 MG MT GUM
2.0000 mg | CHEWING_GUM | OROMUCOSAL | Status: DC | PRN
  Administered 2023-05-11 – 2023-05-17 (×15): 2 mg via ORAL
  Filled 2023-05-11 (×20): qty 1

## 2023-05-11 MED ORDER — LORAZEPAM 2 MG PO TABS
2.0000 mg | ORAL_TABLET | Freq: Three times a day (TID) | ORAL | Status: DC | PRN
  Administered 2023-05-11 – 2023-05-17 (×8): 2 mg via ORAL
  Filled 2023-05-11 (×8): qty 1

## 2023-05-11 MED ORDER — BENZTROPINE MESYLATE 0.5 MG PO TABS
0.5000 mg | ORAL_TABLET | Freq: Two times a day (BID) | ORAL | Status: DC
  Administered 2023-05-11: 0.5 mg via ORAL
  Filled 2023-05-11: qty 1

## 2023-05-11 MED ORDER — NICOTINE 21 MG/24HR TD PT24: 21.0000 mg | MEDICATED_PATCH | Freq: Every day | TRANSDERMAL | Status: DC

## 2023-05-11 MED ORDER — DIPHENHYDRAMINE HCL 50 MG/ML IJ SOLN: 50.0000 mg | Freq: Three times a day (TID) | INTRAMUSCULAR | Status: DC | PRN

## 2023-05-11 MED ORDER — NICOTINE 21 MG/24HR TD PT24
21.0000 mg | MEDICATED_PATCH | Freq: Every day | TRANSDERMAL | Status: DC
  Administered 2023-05-11 – 2023-05-17 (×7): 21 mg via TRANSDERMAL
  Filled 2023-05-11 (×7): qty 1

## 2023-05-11 MED ORDER — RISPERIDONE 1 MG PO TABS: 1.0000 mg | ORAL_TABLET | Freq: Every day | ORAL | Status: DC

## 2023-05-11 MED ORDER — HALOPERIDOL LACTATE 5 MG/ML IJ SOLN: 5.0000 mg | Freq: Three times a day (TID) | INTRAMUSCULAR | Status: DC | PRN

## 2023-05-11 MED ORDER — MAGNESIUM HYDROXIDE 400 MG/5ML PO SUSP: 30.0000 mL | Freq: Every day | ORAL | Status: DC | PRN

## 2023-05-11 MED ORDER — HALOPERIDOL 5 MG PO TABS
5.0000 mg | ORAL_TABLET | Freq: Three times a day (TID) | ORAL | Status: DC | PRN
  Administered 2023-05-13 – 2023-05-14 (×2): 5 mg via ORAL
  Filled 2023-05-11 (×2): qty 1

## 2023-05-11 MED ORDER — BACITRACIN ZINC 500 UNIT/GM EX OINT
TOPICAL_OINTMENT | Freq: Two times a day (BID) | CUTANEOUS | Status: DC
  Administered 2023-05-11 – 2023-05-16 (×10): 1 via TOPICAL
  Filled 2023-05-11 (×13): qty 0.9

## 2023-05-11 MED ORDER — ALUM & MAG HYDROXIDE-SIMETH 200-200-20 MG/5ML PO SUSP: 30.0000 mL | ORAL | Status: DC | PRN

## 2023-05-11 MED ORDER — HYDROXYZINE HCL 50 MG PO TABS
50.0000 mg | ORAL_TABLET | Freq: Three times a day (TID) | ORAL | Status: DC | PRN
  Administered 2023-05-12: 50 mg via ORAL
  Filled 2023-05-11: qty 1

## 2023-05-11 NOTE — Progress Notes (Signed)
Admission Note:   Report was received from [place], [place] on a 37 year-old female who presents IVC in no acute distress for the treatment of SI and Depression. Patient presents sad and tearful at times throughput assessment. Patient was calm and cooperative with admission process. Patient stated that she is here because "my dog is my life and he is my life. I didn't have any clothes on, so I grabbed a sheet and ran out the door. She ran to the highway, and I was trying to flag people down. I'm sure I looked like a crazy person. That's why the police brought me in". Patient had complaints of left foot pain, rating it a "5/10", but she did not request any pain medication at this time. Patient denies depression, but endorses anxiety, stating "being away from my son and my dogs", has her feeling this way. Patient also denies SI/HI/AVH at this time. Patient's goals for treatment are to "get my health back, feeling like myself, and human interaction". Patient has a past medical history of HTN, Depression and Substance-induced mood disorder. Skin was assessed with Darl Pikes, RN and found to be clear of any abnormal marks apart from a few scabs/scratches to her right arm, shoulder, hand, left outer ankle and lower back. Patient also has some bruising to her right upper leg. Patient has nipple piercing's and a tattoo to the back of her neck. Patient searched and no contraband found and unit policies explained and understanding verbalized. Consents obtained. Food and fluids offered, and both accepted. Patient had no additional questions or concerns to voice to this writer at this time. Patient remains safe on the unit.

## 2023-05-11 NOTE — ED Notes (Signed)
Patient off unit to facility per provider. Patient alert, cooperative, no s/s of distress. Patient discharge information and belongings given to [facility] for transport. Patient ambulatory off unit, escorted and transported by [facility].

## 2023-05-11 NOTE — Plan of Care (Signed)
New admission.  Problem: Education: Goal: Knowledge of General Education information will improve Description: Including pain rating scale, medication(s)/side effects and non-pharmacologic comfort measures Outcome: Not Progressing   Problem: Health Behavior/Discharge Planning: Goal: Ability to manage health-related needs will improve Outcome: Not Progressing   Problem: Clinical Measurements: Goal: Ability to maintain clinical measurements within normal limits will improve Outcome: Not Progressing Goal: Will remain free from infection Outcome: Not Progressing Goal: Diagnostic test results will improve Outcome: Not Progressing Goal: Respiratory complications will improve Outcome: Not Progressing Goal: Cardiovascular complication will be avoided Outcome: Not Progressing   Problem: Activity: Goal: Risk for activity intolerance will decrease Outcome: Not Progressing   Problem: Nutrition: Goal: Adequate nutrition will be maintained Outcome: Not Progressing   Problem: Coping: Goal: Level of anxiety will decrease Outcome: Not Progressing   Problem: Elimination: Goal: Will not experience complications related to bowel motility Outcome: Not Progressing Goal: Will not experience complications related to urinary retention Outcome: Not Progressing   Problem: Pain Managment: Goal: General experience of comfort will improve Outcome: Not Progressing   Problem: Safety: Goal: Ability to remain free from injury will improve Outcome: Not Progressing   Problem: Skin Integrity: Goal: Risk for impaired skin integrity will decrease Outcome: Not Progressing   Problem: Education: Goal: Knowledge of [place] [facility] Education information/materials will improve Outcome: Not Progressing Goal: Emotional status will improve Outcome: Not Progressing Goal: Mental status will improve Outcome: Not Progressing Goal: Verbalization of understanding the information provided will  improve Outcome: Not Progressing   Problem: Safety: Goal: Periods of time without injury will increase Outcome: Not Progressing   Problem: Self-Concept: Goal: Ability to identify factors that promote anxiety will improve Outcome: Not Progressing Goal: Level of anxiety will decrease Outcome: Not Progressing Goal: Ability to modify response to factors that promote anxiety will improve Outcome: Not Progressing

## 2023-05-11 NOTE — [facility] Suicide Risk Assessment (Signed)
[facility] Admission Suicide Risk Assessment   Nursing information obtained from:  Patient Demographic factors:  Caucasian, Low socioeconomic status, Living alone Current Mental Status:  NA Loss Factors:  Financial problems / change in socioeconomic status Historical Factors:  NA Risk Reduction Factors:  Responsible for children under 39 years of age, Sense of responsibility to family, Religious beliefs about death  Total Time spent with patient: 45 minutes Principal Problem: Bipolar 1 disorder (HCC) Diagnosis:  Principal Problem:   Bipolar 1 disorder (HCC)  Subjective Data: Patient seen and chart reviewed.  This is a 37 year old woman with a history of mood disorder and several prior hospitalizations.  Presented to the hospital at [facility] with erratic bizarre behavior and aggression.  Currently denies symptoms.  Totally denies suicidal ideation.  Continued Clinical Symptoms:  Alcohol Use Disorder Identification Test Final Score (AUDIT): 2 The "Alcohol Use Disorders Identification Test", Guidelines for Use in Primary Care, Second Edition.  World Science writer [facility]). Score between 0-7:  no or low risk or alcohol related problems. Score between 8-15:  moderate risk of alcohol related problems. Score between 16-19:  high risk of alcohol related problems. Score 20 or above:  warrants further diagnostic evaluation for alcohol dependence and treatment.   CLINICAL FACTORS:   Bipolar Disorder:   Mixed State   Musculoskeletal: Strength & Muscle Tone: within normal limits Gait & Station: normal Patient leans: N/A  Psychiatric Specialty Exam:  Presentation  General Appearance:  Appropriate for Environment  Eye Contact: Good  Speech: Clear and Coherent  Speech Volume: Normal  Handedness: Right   Mood and Affect  Mood: Euthymic  Affect: Appropriate   Thought Process  Thought Processes: Coherent  Descriptions of Associations:Intact  Orientation:Full (Time,  Place and Person)  Thought Content:Logical  History of Schizophrenia/Schizoaffective disorder:No  Duration of Psychotic Symptoms:N/A  Hallucinations:Hallucinations: None  Ideas of Reference:None  Suicidal Thoughts:Suicidal Thoughts: No  Homicidal Thoughts:Homicidal Thoughts: No   Sensorium  Memory: Immediate Good  Judgment: Fair  Insight: Lacking   Executive Functions  Concentration: Fair  Attention Span: Fair  Recall: Fair  Fund of Knowledge: Fair  Language: Fair   Psychomotor Activity  Psychomotor Activity: Psychomotor Activity: Normal   Assets  Assets: Communication Skills; Social Support   Sleep  Sleep: Sleep: Good    Physical Exam: Physical Exam Vitals and nursing note reviewed.  Constitutional:      Appearance: Normal appearance.  HENT:     Head: Normocephalic and atraumatic.     Mouth/Throat:     Pharynx: Oropharynx is clear.  Eyes:     Pupils: Pupils are equal, round, and reactive to light.  Cardiovascular:     Rate and Rhythm: Normal rate and regular rhythm.  Pulmonary:     Effort: Pulmonary effort is normal.     Breath sounds: Normal breath sounds.  Abdominal:     General: Abdomen is flat.     Palpations: Abdomen is soft.  Musculoskeletal:        General: Normal range of motion.  Skin:    General: Skin is warm and dry.  Neurological:     General: No focal deficit present.     Mental Status: She is alert. Mental status is at baseline.  Psychiatric:        Attention and Perception: Attention normal.        Mood and Affect: Mood normal.        Speech: Speech normal.        Behavior: Behavior normal.  Thought Content: Thought content normal.        Cognition and Memory: Cognition normal.    Review of Systems  Constitutional: Negative.   HENT: Negative.    Eyes: Negative.   Respiratory: Negative.    Cardiovascular: Negative.   Gastrointestinal: Negative.   Musculoskeletal: Negative.   Skin: Negative.    Neurological: Negative.   Psychiatric/Behavioral: Negative.     Blood pressure 111/73, pulse 76, temperature 97.9 F (36.6 C), temperature source Oral, resp. rate 18, height 5\' 7"  (1.702 m), weight 68 kg, last menstrual period 04/24/2023, SpO2 99 %. Body mass index is 23.49 kg/m.   COGNITIVE FEATURES THAT CONTRIBUTE TO RISK:  None    SUICIDE RISK:   Mild:  Suicidal ideation of limited frequency, intensity, duration, and specificity.  There are no identifiable plans, no associated intent, mild dysphoria and related symptoms, good self-control (both objective and subjective assessment), few other risk factors, and identifiable protective factors, including available and accessible social support.  PLAN OF CARE: Continue 15-minute checks.  Made suggestions the patient is about starting medication.  Continue daily monitoring for behavior.  Ongoing assessment of dangerousness prior to discharge  I certify that inpatient services furnished can reasonably be expected to improve the patient's condition.   Mordecai Rasmussen, MD 05/11/2023, 2:34 PM

## 2023-05-11 NOTE — Progress Notes (Signed)
Per Night [facility] AC Fransico Michael, RN transfer to [facility] has been delayed until 1st shift due to transport delay because of IVC which requires law enforcement transport.   All needed parties have been updated. 1st shift to follow up.   Maryjean Ka, MSW, [facility] 05/11/2023 12:47 AM

## 2023-05-11 NOTE — ED Notes (Signed)
Patients mother wanted an update on patient and where she is being transported to. RN Sarina Ser, NP to please call the mother [name] and update her.

## 2023-05-11 NOTE — ED Provider Notes (Signed)
Emergency Medicine Observation Re-evaluation Note  Ana Best is a 37 y.o. female, seen on rounds today.  Pt initially presented to the ED for complaints of IVC and Agitation Currently, the patient is sitting in bed.  Eating breakfast..  Physical Exam  BP (!) 102/56 (BP Location: Right Arm)   Pulse (!) 54   Temp 98.6 F (37 C) (Oral)   Resp 16   Ht 1.702 m (5\' 7" )   Wt 68 kg   LMP 04/24/2023   SpO2 99%   BMI 23.49 kg/m  Physical Exam   ED Course / MDM  EKG:EKG Interpretation  Date/Time:  Saturday May 06 2023 13:40:09 EDT Ventricular Rate:  88 PR Interval:  138 QRS Duration: 78 QT Interval:  296 QTC Calculation: 358 R Axis:   74 Text Interpretation: Sinus rhythm Nonspecific repol abnormality, diffuse leads Confirmed by Alvester Chou [phone]) on 05/06/2023 2:15:35 PM  I have reviewed the labs performed to date as well as medications administered while in observation.  Recent changes in the last 24 hours include none.  Plan  Current plan is for inpatient placement.    Lorre Nick, MD 05/11/23 [phone]

## 2023-05-11 NOTE — H&P (Signed)
Psychiatric Admission Assessment Adult  Patient Identification: Ana Best MRN:  [id number] Date of Evaluation:  05/11/2023 Chief Complaint:  Bipolar 1 disorder (HCC) [F31.9] Principal Diagnosis: Bipolar 1 disorder (HCC) Diagnosis:  Principal Problem:   Bipolar 1 disorder (HCC)  History of Present Illness:patient seen and chart reviewed this is a 37 year old woman who was brought to [facility] on the 15th after being picked up by law enforcement behaving erratically.  Running down the road with a sheet wrapped around her.  Patient was described as being hostile and belligerent and erratic when she was in the emergency room at [facility].  At 1 point it is alleged that she wrapped a sheet around her throat and what was interpreted as being a suicide threat.  Patient was documented to have calmed down as of today and was transferred to our hospital.  Patient tells me this is all a misunderstanding.  She says that her dog had escaped and she was afraid it would get hit by a car and so she was running down the street.  She was naked at the time and so she only had a sheet wrapped around her.  Patient says the reason that she was hostile was that she was startled.  There were some mentions of psychotic symptoms in the chart the patient denies those.  She denies that she wrapped a sheet around her neck or tried to harm herself.  She currently denies any symptoms denying depression anger or mood instability.  Denies hallucinations.  Denies delusions.  Denies suicidal or homicidal ideation.  She does seem a little distant and peculiar at times.  She is focused on the idea that she has a bilateral ear infection that is causing her to feel off balance and so has stuffed wads of paper into her ears.  She does not however make any obviously delusional statements.  She says she smokes marijuana heavily at home but denies any other recent drug use.  She has not been going for any follow-up outpatient  mental health care. Associated Signs/Symptoms: Depression Symptoms:  psychomotor agitation, anxiety, Threatened self-injury (Hypo) Manic Symptoms:  Impulsivity, Irritable Mood, Labiality of Mood, Anxiety Symptoms:   None reported Psychotic Symptoms:   There was evidence reported at [facility] that she was having delusions but she currently denies all of those and denies hallucinations. PTSD Symptoms: Negative Total Time spent with patient: 30 minutes  Past Psychiatric History: Patient has had several prior hospitalizations over the last few years.  A couple of them in [place] one of them at [facility].  Diagnosis at 1 point had been primarily substance abuse and had been thought that it was related to amphetamine use.  However there were other hospitalizations where this did not seem to be as much of a [place] and she has been given a diagnosis of bipolar disorder.  She has been treated with Vraylar and risk Bedol in the past but has not stayed compliant outside the hospital.  Denies any history of suicide attempts.  Patient had been diagnosed with ADHD in the past and was previously treated with amphetamine although this has not been prescribed in about 6 months  Is the patient at risk to self? Yes.    Has the patient been a risk to self in the past 6 months? Yes.    Has the patient been a risk to self within the distant past? Yes.    Is the patient a risk to others? Yes.  Has the patient been a risk to others in the past 6 months? Yes.    Has the patient been a risk to others within the distant past? No.   [place] Scale:  Flowsheet Row Admission (Current) from 05/11/2023 in [facility] ED from 05[facility]06/2023 in [facility] Emergency Department at [facility] ED from 04/25/2023 in [facility] at [facility] RISK CATEGORY No Risk No Risk No Risk        Prior Inpatient Therapy: Yes.   If yes, describe several both in [place] and  [place] mostly over the last couple years Prior Outpatient Therapy: Yes.   If yes, describe minimal especially recently.  She tells me she "does not take medicine"  Alcohol Screening: 1. How often do you have a drink containing alcohol?: 2 to 4 times a month 2. How many drinks containing alcohol do you have on a typical day when you are drinking?: 1 or 2 3. How often do you have six or more drinks on one occasion?: Never AUDIT-C Score: 2 4. How often during the last year have you found that you were not able to stop drinking once you had started?: Never 5. How often during the last year have you failed to do what was normally expected from you because of drinking?: Never 6. How often during the last year have you needed a first drink in the morning to get yourself going after a heavy drinking session?: Never 7. How often during the last year have you had a feeling of guilt of remorse after drinking?: Never 8. How often during the last year have you been unable to remember what happened the night before because you had been drinking?: Never 9. Have you or someone else been injured as a result of your drinking?: No 10. Has a relative or friend or a doctor or another health worker been concerned about your drinking or suggested you cut down?: No Alcohol Use Disorder Identification Test Final Score (AUDIT): 2 Substance Abuse History in the last 12 months:  Yes.   Consequences of Substance Abuse: Even the cannabis use has got to be destabilizing although there is also past history of amphetamine misuse Previous Psychotropic Medications: Yes  Psychological Evaluations: Yes  Past Medical History:  Past Medical History:  Diagnosis Date   Anxiety    Bipolar 1 disorder (HCC)    Depression    Hypertension     Past Surgical History:  Procedure Laterality Date   DILATION AND CURETTAGE OF UTERUS     TONSILLECTOMY     WISDOM TOOTH EXTRACTION     Family History:  Family History  Problem  Relation Age of Onset   Depression Father    Anxiety disorder Father    ADD / ADHD Brother    Family Psychiatric  History: Anxiety and depression in the family Tobacco Screening:  Social History   Tobacco Use  Smoking Status Former   Packs/day: 0.50   Years: 10.00   Additional pack years: 0.00   Total pack years: 5.00   Types: Cigarettes   Quit date: 03/08/2014   Years since quitting: 9.1  Smokeless Tobacco Never    BH Tobacco Counseling     Are you interested in Tobacco Cessation Medications?  Yes, implement Nicotene Replacement Protocol Counseled patient on smoking cessation:  Yes Reason Tobacco Screening Not Completed: No value filed.       Social History:  Social History   Substance and Sexual Activity  Alcohol Use Yes   Comment: Pt denies substance use     Social History   Substance and Sexual Activity  Drug Use Yes   Types: Marijuana   Comment: Pt denies substance use    Additional Social History:                           Allergies:  Not on File Lab Results: No results found for this or any previous visit (from the past 48 hour(s)).  Blood Alcohol level:  Lab Results  Component Value Date   ETH <10 05/06/2023   ETH <10 09/20/2022    Metabolic Disorder Labs:  Lab Results  Component Value Date   HGBA1C 4.7 (L) 09/24/2022   MPG 88.19 09/24/2022   MPG 93.93 10/09/2021   No results found for: "PROLACTIN" Lab Results  Component Value Date   CHOL 147 09/24/2022   TRIG 61 09/24/2022   HDL 41 09/24/2022   CHOLHDL 3.6 09/24/2022   VLDL 12 09/24/2022   LDLCALC 94 09/24/2022   LDLCALC 93 10/09/2021    Current Medications: Current Facility-Administered Medications  Medication Dose Route Frequency Provider Last Rate Last Admin   acetaminophen (TYLENOL) tablet 650 mg  650 mg Oral Q6H PRN Motley-Mangrum, Jadeka A, PMHNP       alum & mag hydroxide-simeth (MAALOX/MYLANTA) 200-200-20 MG/5ML suspension 30 mL  30 mL Oral Q4H PRN  Motley-Mangrum, Jadeka A, PMHNP       bacitracin ointment   Topical BID Motley-Mangrum, Jadeka A, PMHNP       diphenhydrAMINE (BENADRYL) capsule 50 mg  50 mg Oral TID PRN Motley-Mangrum, Jadeka A, PMHNP       Or   diphenhydrAMINE (BENADRYL) injection 50 mg  50 mg Intramuscular TID PRN Motley-Mangrum, Jadeka A, PMHNP       divalproex (DEPAKOTE) DR tablet 500 mg  500 mg Oral Q12H Klarissa Mcilvain T, MD       haloperidol (HALDOL) tablet 5 mg  5 mg Oral TID PRN Motley-Mangrum, Jadeka A, PMHNP       Or   haloperidol lactate (HALDOL) injection 5 mg  5 mg Intramuscular TID PRN Motley-Mangrum, Jadeka A, PMHNP       hydrOXYzine (ATARAX) tablet 50 mg  50 mg Oral TID PRN Motley-Mangrum, Jadeka A, PMHNP       LORazepam (ATIVAN) tablet 2 mg  2 mg Oral TID PRN Motley-Mangrum, Jadeka A, PMHNP       Or   LORazepam (ATIVAN) injection 2 mg  2 mg Intramuscular TID PRN Motley-Mangrum, Jadeka A, PMHNP       magnesium hydroxide (MILK OF MAGNESIA) suspension 30 mL  30 mL Oral Daily PRN Motley-Mangrum, Jadeka A, PMHNP       nicotine (NICODERM CQ - dosed in mg/24 hours) patch 21 mg  21 mg Transdermal Daily Laquanna Veazey T, MD       potassium chloride SA (KLOR-CON M) CR tablet 40 mEq  40 mEq Oral BID Motley-Mangrum, Jadeka A, PMHNP       risperiDONE (RISPERDAL) tablet 2 mg  2 mg Oral QHS Katelyne Galster T, MD       traZODone (DESYREL) tablet 50 mg  50 mg Oral QHS Motley-Mangrum, Jadeka A, PMHNP       PTA Medications: Medications Prior to Admission  Medication Sig Dispense Refill Last Dose   amoxicillin-clavulanate (AUGMENTIN) 875-125 MG tablet Take 1 tablet by mouth 2 (two) times daily. (Patient not taking: Reported on 05/06/2023) 20  tablet 0 Not Taking   bacitracin ointment Apply topically 2 (two) times daily. (Patient not taking: Reported on 05/11/2023) 120 g 0 Not Taking   benztropine (COGENTIN) 1 MG tablet Take 1 tablet (1 mg total) by mouth daily. (Patient not taking: Reported on 05/11/2023) 30 tablet 0 Not Taking    cariprazine (VRAYLAR) 1.5 MG capsule Take 1 capsule (1.5 mg total) by mouth daily. (Patient not taking: Reported on 05/11/2023) 30 capsule 0 Not Taking   hydrOXYzine (ATARAX) 25 MG tablet Take 1 tablet (25 mg total) by mouth 3 (three) times daily as needed for anxiety. (Patient not taking: Reported on 05/11/2023) 30 tablet 0 Not Taking   risperiDONE (RISPERDAL) 1 MG tablet Take 1 tablet (1 mg total) by mouth daily. (Patient not taking: Reported on 05/11/2023) 30 tablet 0 Not Taking   risperiDONE (RISPERDAL) 3 MG tablet Take 1 tablet (3 mg total) by mouth at bedtime. (Patient not taking: Reported on 05/11/2023) 30 tablet 0 Not Taking   traZODone (DESYREL) 50 MG tablet Take 1 tablet (50 mg total) by mouth at bedtime as needed for up to 15 days for sleep. (Patient not taking: Reported on 05/11/2023) 15 tablet 0 Not Taking    Musculoskeletal: Strength & Muscle Tone: within normal limits Gait & Station: normal Patient leans: N/A            Psychiatric Specialty Exam:  Presentation  General Appearance:  Appropriate for Environment  Eye Contact: Good  Speech: Clear and Coherent  Speech Volume: Normal  Handedness: Right   Mood and Affect  Mood: Euthymic  Affect: Appropriate   Thought Process  Thought Processes: Coherent  Duration of Psychotic Symptoms: Unclear.  Certainly they seem present on the 15th and to have diminished now although how long they have been going on is still unclear Past Diagnosis of Schizophrenia or Psychoactive disorder: No  Descriptions of Associations:Intact  Orientation:Full (Time, Place and Person)  Thought Content:Logical  Hallucinations:Hallucinations: None  Ideas of Reference:None  Suicidal Thoughts:Suicidal Thoughts: No  Homicidal Thoughts:Homicidal Thoughts: No   Sensorium  Memory: Immediate Good  Judgment: Fair  Insight: Lacking   Executive Functions  Concentration: Fair  Attention  Span: Fair  Recall: [employer] of Knowledge: Fair  Language: Fair   Psychomotor Activity  Psychomotor Activity: Psychomotor Activity: Normal   Assets  Assets: Communication Skills; Social Support   Sleep  Sleep: Sleep: Good    Physical Exam: Physical Exam Vitals and nursing note reviewed.  Constitutional:      Appearance: Normal appearance.  HENT:     Head: Normocephalic and atraumatic.     Mouth/Throat:     Pharynx: Oropharynx is clear.  Eyes:     Pupils: Pupils are equal, round, and reactive to light.  Cardiovascular:     Rate and Rhythm: Normal rate and regular rhythm.  Pulmonary:     Effort: Pulmonary effort is normal.     Breath sounds: Normal breath sounds.  Abdominal:     General: Abdomen is flat.     Palpations: Abdomen is soft.  Musculoskeletal:        General: Normal range of motion.  Skin:    General: Skin is warm and dry.  Neurological:     General: No focal deficit present.     Mental Status: She is alert. Mental status is at baseline.  Psychiatric:        Attention and Perception: Attention normal.        Mood and Affect: Mood normal.  Speech: Speech normal.        Behavior: Behavior normal.        Thought Content: Thought content normal.        Cognition and Memory: Cognition normal.        Judgment: Judgment normal.    Review of Systems  Constitutional: Negative.   HENT: Negative.    Eyes: Negative.   Respiratory: Negative.    Cardiovascular: Negative.   Gastrointestinal: Negative.   Musculoskeletal: Negative.   Skin: Negative.   Neurological: Negative.   Psychiatric/Behavioral: Negative.     Blood pressure 111/73, pulse 76, temperature 97.9 F (36.6 C), temperature source Oral, resp. rate 18, height 5\' 7"  (1.702 m), weight 68 kg, last menstrual period 04/24/2023, SpO2 99 %. Body mass index is 23.49 kg/m.  Treatment Plan Summary: Medication management and Plan although the patient is calmer now it does look like  she had been on some medicine over in the emergency room in [place] which may account for some of that.  She is not currently reporting any delusions but continues to have very limited insight about her condition.  The most likely thing it would seems that she does have bipolar disorder worsened by cannabis and possibly other drug abuse.  I suggested to her that it does not look like that she has been on any real mood stabilizer and less you count the Vraylar.  I suggested starting Depakote.  Side effects reviewed.  Patient agreeable to starting it.  I am going to leave the risk Bedol in place may be at just 2 mg at night.  As needed medicine available.  Including groups and activities ongoing assessment of dangerousness and symptoms while in the hospital.  It seems like she is probably homeless right now and is going to need that to be a focus as well  Observation Level/Precautions:  15 minute checks  Laboratory:  Chemistry Profile  Psychotherapy:    Medications:    Consultations:    Discharge Concerns:    Estimated LOS:  Other:     Physician Treatment Plan for Primary Diagnosis: Bipolar 1 disorder (HCC) Long Term Goal(s): Improvement in symptoms so as ready for discharge  Short Term Goals: Ability to verbalize feelings will improve and Ability to demonstrate self-control will improve  Physician Treatment Plan for Secondary Diagnosis: Principal Problem:   Bipolar 1 disorder (HCC)  Long Term Goal(s): Improvement in symptoms so as ready for discharge  Short Term Goals: Ability to maintain clinical measurements within normal limits will improve and Compliance with prescribed medications will improve  I certify that inpatient services furnished can reasonably be expected to improve the patient's condition.    Mordecai Rasmussen, MD 6/20/20242:43 PM

## 2023-05-11 NOTE — Tx Team (Signed)
Initial Treatment Plan 05/11/2023 1:38 PM [name] ZOX:[id number]    PATIENT STRESSORS: Financial difficulties   Substance abuse     PATIENT STRENGTHS: Forensic psychologist fund of knowledge  Motivation for treatment/growth    PATIENT IDENTIFIED PROBLEMS: Anxiety  Substance use                   DISCHARGE CRITERIA:  Ability to meet basic life and health needs Improved stabilization in mood, thinking, and/or behavior Motivation to continue treatment in a less acute level of care Need for constant or close observation no longer present Reduction of life-threatening or endangering symptoms to within safe limits  PRELIMINARY DISCHARGE PLAN: Outpatient therapy Return to previous living arrangement  PATIENT/FAMILY INVOLVEMENT: This treatment plan has been presented to and reviewed with the patient, Ana Best. The patient has been given the opportunity to ask questions and make suggestions.  Sapphira Harjo, RN 05/11/2023, 1:38 PM

## 2023-05-11 NOTE — Group Note (Signed)
[facility] LCSW Group Therapy Note   Group Date: 05/11/2023 Start Time: 1315 End Time: 1400   Type of Therapy/Topic:  Group Therapy:  Balance in Life  Participation Level:  Active   Description of Group:    This group will address the concept of balance and how it feels and looks when one is unbalanced. Patients will be encouraged to process areas in their lives that are out of balance, and identify reasons for remaining unbalanced. Facilitators will guide patients utilizing problem- solving interventions to address and correct the stressor making their life unbalanced. Understanding and applying boundaries will be explored and addressed for obtaining  and maintaining a balanced life. Patients will be encouraged to explore ways to assertively make their unbalanced needs known to significant others in their lives, using other group members and facilitator for support and feedback.  Therapeutic Goals: Patient will identify two or more emotions or situations they have that consume much of in their lives. Patient will identify signs/triggers that life has become out of balance:  Patient will identify two ways to set boundaries in order to achieve balance in their lives:  Patient will demonstrate ability to communicate their needs through discussion and/or role plays  Summary of Patient Progress:    Pt stated that Fall was her favorite season during the icebreaker. Pt say that balance meant "level or even". Pt enjoys exercise and nature as a way to regain balance and discussed putting her feet in the soil to feel grounded.     Therapeutic Modalities:   Cognitive Behavioral Therapy Solution-Focused Therapy Assertiveness Training   Elza Rafter, [place]

## 2023-05-12 DIAGNOSIS — H6093 Unspecified otitis externa, bilateral: Secondary | ICD-10-CM | POA: Diagnosis not present

## 2023-05-12 DIAGNOSIS — H6693 Otitis media, unspecified, bilateral: Secondary | ICD-10-CM

## 2023-05-12 DIAGNOSIS — F319 Bipolar disorder, unspecified: Secondary | ICD-10-CM | POA: Diagnosis not present

## 2023-05-12 LAB — LIPID PANEL
Cholesterol: 156 mg/dL (ref 0–200)
HDL: 41 mg/dL (ref >40)
LDL Cholesterol: 89 mg/dL (ref 0–99)
Total CHOL/HDL Ratio: 3.8 RATIO
Triglycerides: 132 mg/dL (ref <150)
VLDL: 26 mg/dL (ref 0–40)

## 2023-05-12 LAB — HEMOGLOBIN A1C
Hgb A1c MFr Bld: 4.9 % (ref 4.8–5.6)
Mean Plasma Glucose: 93.93 mg/dL

## 2023-05-12 MED ORDER — NEOMYCIN-POLYMYXIN-HC 3.5-10000-1 OT SOLN
4.0000 [drp] | Freq: Four times a day (QID) | OTIC | Status: DC
  Filled 2023-05-12: qty 10

## 2023-05-12 MED ORDER — AMOXICILLIN-POT CLAVULANATE 875-125 MG PO TABS
1.0000 | ORAL_TABLET | Freq: Two times a day (BID) | ORAL | Status: DC
  Administered 2023-05-12 – 2023-05-17 (×9): 1 via ORAL
  Filled 2023-05-12 (×11): qty 1

## 2023-05-12 MED ORDER — NEOMYCIN-POLYMYXIN-HC 3.5-10000-1 OT SOLN
4.0000 [drp] | Freq: Four times a day (QID) | OTIC | Status: DC
  Administered 2023-05-12 – 2023-05-16 (×13): 4 [drp] via OTIC
  Filled 2023-05-12: qty 10

## 2023-05-12 NOTE — Consult Note (Signed)
Initial Consultation Note   Patient: Ana Best KGM:[id number] DOB: [birth year] PCP: Patient, No Pcp Per DOA: 05/11/2023 DOS: the patient was seen and examined on 05/12/2023 Primary service: Audery Amel, MD  Referring physician: Audery Amel, MD Reason for consult: Bilateral ear pain  Assessment/Plan: Assessment and Plan: BOM (bilateral otitis media) Patient with bilateral otitis media and otitis externa times roughly 1 month Noted incomplete course of outpatient amoxicillin Bilateral ear canal redness and irritation to otoscopic exam as well as mild tympanic membrane redness Will start a course of Augmentin as well as Cortisporin otic  Should give adequate treatment course Please reach back out to tried hospitalist if there is any persistence of symptoms May benefit from ENT consult if symptoms recur       TRH will sign off at present, please call us again when needed.  HPI: Ana Best is a 37 y.o. female with past medical history of anxiety, bipolar disorder, depression, hypertension who is currently admitted to the behavioral health service.  Was asked to evaluate patient in setting of bilateral ear pain.  Patient reports worsening bilateral ear pain over the past 3 to 4 weeks.  Was initially diagnosed with a bilateral ear infection and was given a course of amoxicillin.  Patient states she only took 1-2 doses.  Has had worsening pain since being admitted.  Has been using toilet paper to stuff her ears.  Positive worsening pain.  No reported fevers or chills.  Reported nausea or vomiting.  Former smoker.  No reported history of recurring ear infections in the past. Currently at facility afebrile, hemodynamically stable.  Satting well on room air.  Lipid panel and A1c stable today. Review of Systems: As mentioned in the history of present illness. All other systems reviewed and are negative. Past Medical History:  Diagnosis Date   Anxiety    Bipolar 1 disorder (HCC)     Depression    Hypertension    Past Surgical History:  Procedure Laterality Date   DILATION AND CURETTAGE OF UTERUS     TONSILLECTOMY     WISDOM TOOTH EXTRACTION     Social History:  reports that she quit smoking about 9 years ago. Her smoking use included cigarettes. She has a 5.00 pack-year smoking history. She has never used smokeless tobacco. She reports current alcohol use. She reports current drug use. Drug: Marijuana.  Not on File  Family History  Problem Relation Age of Onset   Depression Father    Anxiety disorder Father    ADD / ADHD Brother     Prior to Admission medications   Medication Sig Start Date End Date Taking? Authorizing Provider  amoxicillin-clavulanate (AUGMENTIN) 875-125 MG tablet Take 1 tablet by mouth 2 (two) times daily. Patient not taking: Reported on 05/06/2023 04/25/23   Elson Areas, PA-C  bacitracin ointment Apply topically 2 (two) times daily. Patient not taking: Reported on 05/11/2023 05/09/23   Earney Navy, NP  benztropine (COGENTIN) 1 MG tablet Take 1 tablet (1 mg total) by mouth daily. Patient not taking: Reported on 05/11/2023 05/09/23 06/08/23  Earney Navy, NP  cariprazine (VRAYLAR) 1.5 MG capsule Take 1 capsule (1.5 mg total) by mouth daily. Patient not taking: Reported on 05/11/2023 05/10/23 06/09/23  Earney Navy, NP  hydrOXYzine (ATARAX) 25 MG tablet Take 1 tablet (25 mg total) by mouth 3 (three) times daily as needed for anxiety. Patient not taking: Reported on 05/11/2023 05/09/23   Earney Navy, NP  risperiDONE (  RISPERDAL) 1 MG tablet Take 1 tablet (1 mg total) by mouth daily. Patient not taking: Reported on 05/11/2023 05/10/23 06/09/23  Earney Navy, NP  risperiDONE (RISPERDAL) 3 MG tablet Take 1 tablet (3 mg total) by mouth at bedtime. Patient not taking: Reported on 05/11/2023 05/09/23 06/08/23  Earney Navy, NP  traZODone (DESYREL) 50 MG tablet Take 1 tablet (50 mg total) by mouth at bedtime as needed  for up to 15 days for sleep. Patient not taking: Reported on 05/11/2023 05/09/23 05/24/23  Earney Navy, NP    Physical Exam: Vitals:   05/11/23 1211 05/12/23 0615  BP: 111/73 (!) 96/53  Pulse: 76 71  Resp: 18 20  Temp: 97.9 F (36.6 C) 97.8 F (36.6 C)  TempSrc: Oral Oral  SpO2: 99% 98%  Weight: 68 kg   Height: 5\' 7"  (1.702 m)    Physical Exam Constitutional:      Appearance: She is normal weight.  HENT:     Head: Normocephalic and atraumatic.     Ears:     Comments: Bilateral ear canal redness and tenderness to palpation on otoscopic exam Mild tympanic membrane redness Mild bleeding in ear canals bilaterally-likely secondary to trauma from toilet paper stuffing    Mouth/Throat:     Mouth: Mucous membranes are moist.  Eyes:     Pupils: Pupils are equal, round, and reactive to light.  Cardiovascular:     Rate and Rhythm: Normal rate and regular rhythm.  Pulmonary:     Effort: Pulmonary effort is normal.  Abdominal:     General: Bowel sounds are normal.  Musculoskeletal:        General: Normal range of motion.  Skin:    General: Skin is warm.  Neurological:     General: No focal deficit present.     Mental Status: She is alert.  Psychiatric:        Mood and Affect: Mood normal.     Data Reviewed:   There are no new results to review at this time.    Family Communication: Discussed plan of care with patient Primary team communication: yes  Thank you very much for involving [place] in the care of your patient.  Author: Floydene Flock, MD 05/12/2023 3:10 PM  For on call review [email or web address].
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
## 2023-05-12 NOTE — Assessment & Plan Note (Signed)
Patient with bilateral otitis media and otitis externa times roughly 1 month Noted incomplete course of outpatient amoxicillin Bilateral ear canal redness and irritation to otoscopic exam as well as mild tympanic membrane redness Will start a course of Augmentin as well as Cortisporin otic  Should give adequate treatment course Please reach back out to tried hospitalist if there is any persistence of symptoms May benefit from ENT consult if symptoms recur

## 2023-05-12 NOTE — Group Note (Signed)
[facility] LCSW Group Therapy Note   Group Date: 05/12/2023 Start Time: 1315 End Time: 1415  Type of Therapy and Topic:  Group Therapy:  Feelings around Relapse and Recovery  Participation Level:  Active    Description of Group:    Patients in this group will discuss emotions they experience before and after a relapse. They will process how experiencing these feelings, or avoidance of experiencing them, relates to having a relapse. Facilitator will guide patients to explore emotions they have related to recovery. Patients will be encouraged to process which emotions are more powerful. They will be guided to discuss the emotional reaction significant others in their lives may have to patients' relapse or recovery. Patients will be assisted in exploring ways to respond to the emotions of others without this contributing to a relapse.  Therapeutic Goals: Patient will identify two or more emotions that lead to relapse for them:  Patient will identify two emotions that result when they relapse:  Patient will identify two emotions related to recovery:  Patient will demonstrate ability to communicate their needs through discussion and/or role plays.   Summary of Patient Progress: Patient was present for the entirety of the group process. She defined relapse as getting back on drugs. Pt stated she believes that relapse is always a part of the recovery process. She shared that she is trying not to relapse into negative thought processes/pessimistic outlook. Pt stated that being upset or people messing with her child is a risk factor for relapse for her. Her son was identified as a resiliency factor that helps her avoid relapse. Deep breathing, prayer, and setting a routine were noted as things that she can do to refocus on her recovery after leaving the hospital. She presented with some insight into the topic. Pt appeared open and receptive to feedback/comments from both peers and facilitator.   Therapeutic  Modalities:   Cognitive Behavioral Therapy Solution-Focused Therapy Assertiveness Training Relapse Prevention Therapy   Glenis Smoker, LCSW

## 2023-05-12 NOTE — Progress Notes (Signed)
[facility] MD Progress Note  05/12/2023 12:55 PM Ana Best  MRN:  [id number] Subjective:.  Patient attended treatment team.  Patient reports that her mood is okay.  Her goals are to just get herself thinking more clearly.  So far she is compliant with medicine.  She continues to complain of pain in both of her ears and was asking to have them looked at today.  Not reporting any suicidal ideation and not displaying any particularly agitated behavior although she does seem a little more euphoric than might be appropriate Principal Problem: Bipolar 1 disorder (HCC) Diagnosis: Principal Problem:   Bipolar 1 disorder (HCC)  Total Time spent with patient: 30 minutes  Past Psychiatric History: Past history of bipolar disorder or schizoaffective disorder.  Past Medical History:  Past Medical History:  Diagnosis Date   Anxiety    Bipolar 1 disorder (HCC)    Depression    Hypertension     Past Surgical History:  Procedure Laterality Date   DILATION AND CURETTAGE OF UTERUS     TONSILLECTOMY     WISDOM TOOTH EXTRACTION     Family History:  Family History  Problem Relation Age of Onset   Depression Father    Anxiety disorder Father    ADD / ADHD Brother    Family Psychiatric  History: See previous Social History:  Social History   Substance and Sexual Activity  Alcohol Use Yes   Comment: Pt denies substance use     Social History   Substance and Sexual Activity  Drug Use Yes   Types: Marijuana   Comment: Pt denies substance use    Social History   Socioeconomic History   Marital status: Single    Spouse name: Not on file   Number of children: Not on file   Years of education: Not on file   Highest education level: Not on file  Occupational History   Not on file  Tobacco Use   Smoking status: Former    Packs/day: 0.50    Years: 10.00    Additional pack years: 0.00    Total pack years: 5.00    Types: Cigarettes    Quit date: 03/08/2014    Years since quitting: 9.1    Smokeless tobacco: Never  Vaping Use   Vaping Use: Never used  Substance and Sexual Activity   Alcohol use: Yes    Comment: Pt denies substance use   Drug use: Yes    Types: Marijuana    Comment: Pt denies substance use   Sexual activity: Yes    Partners: Male    Comment: number of sex partners in the last 12 months  1  Other Topics Concern   Not on file  Social History Narrative   ** Merged History Encounter **       Social Determinants of Health   Financial Resource Strain: Not on file  Food Insecurity: Food Insecurity Present (05/11/2023)   Hunger Vital Sign    Worried About Running Out of Food in the Last Year: Often true    Ran Out of Food in the Last Year: Often true  Transportation Needs: No Transportation Needs (05/11/2023)   PRAPARE - Administrator, Civil Service (Medical): No    Lack of Transportation (Non-Medical): No  Physical Activity: Not on file  Stress: Not on file  Social Connections: Not on file   Additional Social History:  Sleep: Fair  Appetite:  Fair  Current Medications: Current Facility-Administered Medications  Medication Dose Route Frequency Provider Last Rate Last Admin   acetaminophen (TYLENOL) tablet 650 mg  650 mg Oral Q6H PRN Motley-Mangrum, Jadeka A, PMHNP       alum & mag hydroxide-simeth (MAALOX/MYLANTA) 200-200-20 MG/5ML suspension 30 mL  30 mL Oral Q4H PRN Motley-Mangrum, Jadeka A, PMHNP       bacitracin ointment   Topical BID Motley-Mangrum, Jadeka A, PMHNP   1 Application at 05/12/23 0837   diphenhydrAMINE (BENADRYL) capsule 50 mg  50 mg Oral TID PRN Motley-Mangrum, Jadeka A, PMHNP       Or   diphenhydrAMINE (BENADRYL) injection 50 mg  50 mg Intramuscular TID PRN Motley-Mangrum, Jadeka A, PMHNP       divalproex (DEPAKOTE) DR tablet 500 mg  500 mg Oral Q12H Chantell Kunkler T, MD   500 mg at 05/12/23 0836   haloperidol (HALDOL) tablet 5 mg  5 mg Oral TID PRN Motley-Mangrum, Jadeka A, PMHNP        Or   haloperidol lactate (HALDOL) injection 5 mg  5 mg Intramuscular TID PRN Motley-Mangrum, Geralynn Ochs A, PMHNP       hydrOXYzine (ATARAX) tablet 50 mg  50 mg Oral TID PRN Motley-Mangrum, Jadeka A, PMHNP       LORazepam (ATIVAN) tablet 2 mg  2 mg Oral TID PRN Motley-Mangrum, Geralynn Ochs A, PMHNP   2 mg at 05/11/23 1451   Or   LORazepam (ATIVAN) injection 2 mg  2 mg Intramuscular TID PRN Motley-Mangrum, Jadeka A, PMHNP       magnesium hydroxide (MILK OF MAGNESIA) suspension 30 mL  30 mL Oral Daily PRN Motley-Mangrum, Jadeka A, PMHNP       nicotine (NICODERM CQ - dosed in mg/24 hours) patch 21 mg  21 mg Transdermal Daily Nicle Connole, Jackquline Denmark, MD   21 mg at 05/12/23 8469   nicotine polacrilex (NICORETTE) gum 2 mg  2 mg Oral PRN Tyrek Lawhorn, Jackquline Denmark, MD   2 mg at 05/12/23 0837   risperiDONE (RISPERDAL) tablet 2 mg  2 mg Oral QHS Kourtni Stineman T, MD   2 mg at 05/11/23 2216   traZODone (DESYREL) tablet 50 mg  50 mg Oral QHS Motley-Mangrum, Jadeka A, PMHNP   50 mg at 05/11/23 2216    Lab Results:  Results for orders placed or performed during the hospital encounter of 05/11/23 (from the past 48 hour(s))  Lipid panel     Status: None   Collection Time: 05/12/23  7:10 AM  Result Value Ref Range   Cholesterol 156 0 - 200 mg/dL   Triglycerides 629 <528 mg/dL   HDL 41 >41 mg/dL   Total CHOL/HDL Ratio 3.8 RATIO   VLDL 26 0 - 40 mg/dL   LDL Cholesterol 89 0 - 99 mg/dL    Comment:        Total Cholesterol/HDL:CHD Risk Coronary Heart Disease Risk Table                     Men   Women  1/2 Average Risk   3.4   3.3  Average Risk       5.0   4.4  2 X Average Risk   9.6   7.1  3 X Average Risk  23.4   11.0        Use the calculated Patient Ratio above and the CHD Risk Table to determine the patient's CHD Risk.  ATP III CLASSIFICATION (LDL):  <100     mg/dL   Optimal  161-096  mg/dL   Near or Above                    Optimal  130-159  mg/dL   Borderline  045-409  mg/dL   High  >811     mg/dL   Very  High Performed at [facility], [address] [place], [place] [address]   Hemoglobin A1c     Status: None   Collection Time: 05/12/23  7:10 AM  Result Value Ref Range   Hgb A1c MFr Bld 4.9 4.8 - 5.6 %    Comment: (NOTE) Pre diabetes:          5.7%-6.4%  Diabetes:              >6.4%  Glycemic control for   <7.0% adults with diabetes    Mean Plasma Glucose 93.93 mg/dL    Comment: Performed at [facility], [address], [place], [place] [address]    Blood Alcohol level:  Lab Results  Component Value Date   ETH <10 05/06/2023   ETH <10 09/20/2022    Metabolic Disorder Labs: Lab Results  Component Value Date   HGBA1C 4.9 05/12/2023   MPG 93.93 05/12/2023   MPG 88.19 09/24/2022   No results found for: "PROLACTIN" Lab Results  Component Value Date   CHOL 156 05/12/2023   TRIG 132 05/12/2023   HDL 41 05/12/2023   CHOLHDL 3.8 05/12/2023   VLDL 26 05/12/2023   LDLCALC 89 05/12/2023   LDLCALC 94 09/24/2022    Physical Findings: AIMS:  , ,  ,  ,    CIWA:    COWS:     Musculoskeletal: Strength & Muscle Tone: within normal limits Gait & Station: normal Patient leans: N/A  Psychiatric Specialty Exam:  Presentation  General Appearance:  Appropriate for Environment  Eye Contact: Good  Speech: Clear and Coherent  Speech Volume: Normal  Handedness: Right   Mood and Affect  Mood: Euthymic  Affect: Appropriate   Thought Process  Thought Processes: Coherent  Descriptions of Associations:Intact  Orientation:Full (Time, Place and Person)  Thought Content:Logical  History of Schizophrenia/Schizoaffective disorder:No  Duration of Psychotic Symptoms:N/A  Hallucinations:No data recorded Ideas of Reference:None  Suicidal Thoughts:No data recorded Homicidal Thoughts:No data recorded  Sensorium  Memory: Immediate Good  Judgment: Fair  Insight: Lacking   Executive Functions  Concentration: Fair  Attention  Span: Fair  Recall: [employer] of Knowledge: Fair  Language: Fair   Psychomotor Activity  Psychomotor Activity:No data recorded  Assets  Assets: Communication Skills; Social Support   Sleep  Sleep:No data recorded   Physical Exam: Physical Exam Vitals and nursing note reviewed.  Constitutional:      Appearance: Normal appearance.  HENT:     Head: Normocephalic and atraumatic.     Mouth/Throat:     Pharynx: Oropharynx is clear.  Eyes:     Pupils: Pupils are equal, round, and reactive to light.  Cardiovascular:     Rate and Rhythm: Normal rate and regular rhythm.  Pulmonary:     Effort: Pulmonary effort is normal.     Breath sounds: Normal breath sounds.  Abdominal:     General: Abdomen is flat.     Palpations: Abdomen is soft.  Musculoskeletal:        General: Normal range of motion.  Skin:    General: Skin is  warm and dry.  Neurological:     General: No focal deficit present.     Mental Status: She is alert. Mental status is at baseline.  Psychiatric:        Attention and Perception: Attention normal.        Mood and Affect: Mood is elated.        Speech: Speech normal.        Behavior: Behavior normal.        Thought Content: Thought content normal.        Cognition and Memory: Cognition normal.    Review of Systems  Constitutional: Negative.   HENT: Negative.    Eyes: Negative.   Respiratory: Negative.    Cardiovascular: Negative.   Gastrointestinal: Negative.   Musculoskeletal: Negative.   Skin: Negative.   Neurological: Negative.   Psychiatric/Behavioral: Negative.     Blood pressure (!) 96/53, pulse 71, temperature 97.8 F (36.6 C), temperature source Oral, resp. rate 20, height 5\' 7"  (1.702 m), weight 68 kg, last menstrual period 04/24/2023, SpO2 98 %. Body mass index is 23.49 kg/m.   Treatment Plan Summary: Medication management and Plan patient is on Depakote and Risperdal.  Tolerating medicine fine.  No active behavior problems.   Consultation requested from the hospitalist service to take a look at her ears.  Blood pressure is on the low side but the patient is not dizzy or complaining of any symptoms from it.  No change otherwise for treatment.  Pending the results of the consult to see if we need to do anything different for her ears.  Mordecai Rasmussen, MD 05/12/2023, 12:55 PM

## 2023-05-12 NOTE — BH IP Treatment Plan (Signed)
Interdisciplinary Treatment and Diagnostic Plan Update  05/12/2023 Time of Session: 08:53 Ana Best MRN: [id number]  Principal Diagnosis: Bipolar 1 disorder (HCC)  Secondary Diagnoses: Principal Problem:   Bipolar 1 disorder (HCC)   Current Medications:  Current Facility-Administered Medications  Medication Dose Route Frequency Provider Last Rate Last Admin   acetaminophen (TYLENOL) tablet 650 mg  650 mg Oral Q6H PRN Motley-Mangrum, Jadeka A, PMHNP       alum & mag hydroxide-simeth (MAALOX/MYLANTA) 200-200-20 MG/5ML suspension 30 mL  30 mL Oral Q4H PRN Motley-Mangrum, Jadeka A, PMHNP       bacitracin ointment   Topical BID Motley-Mangrum, Jadeka A, PMHNP   1 Application at 05/12/23 0837   diphenhydrAMINE (BENADRYL) capsule 50 mg  50 mg Oral TID PRN Motley-Mangrum, Jadeka A, PMHNP       Or   diphenhydrAMINE (BENADRYL) injection 50 mg  50 mg Intramuscular TID PRN Motley-Mangrum, Jadeka A, PMHNP       divalproex (DEPAKOTE) DR tablet 500 mg  500 mg Oral Q12H Clapacs, John T, MD   500 mg at 05/12/23 0836   haloperidol (HALDOL) tablet 5 mg  5 mg Oral TID PRN Motley-Mangrum, Jadeka A, PMHNP       Or   haloperidol lactate (HALDOL) injection 5 mg  5 mg Intramuscular TID PRN Motley-Mangrum, Jadeka A, PMHNP       hydrOXYzine (ATARAX) tablet 50 mg  50 mg Oral TID PRN Motley-Mangrum, Jadeka A, PMHNP       LORazepam (ATIVAN) tablet 2 mg  2 mg Oral TID PRN Motley-Mangrum, Jadeka A, PMHNP   2 mg at 05/11/23 1451   Or   LORazepam (ATIVAN) injection 2 mg  2 mg Intramuscular TID PRN Motley-Mangrum, Jadeka A, PMHNP       magnesium hydroxide (MILK OF MAGNESIA) suspension 30 mL  30 mL Oral Daily PRN Motley-Mangrum, Jadeka A, PMHNP       nicotine (NICODERM CQ - dosed in mg/24 hours) patch 21 mg  21 mg Transdermal Daily Clapacs, Jackquline Denmark, MD   21 mg at 05/12/23 9678   nicotine polacrilex (NICORETTE) gum 2 mg  2 mg Oral PRN Clapacs, Jackquline Denmark, MD   2 mg at 05/12/23 0837   risperiDONE (RISPERDAL) tablet 2 mg   2 mg Oral QHS Clapacs, John T, MD   2 mg at 05/11/23 2216   traZODone (DESYREL) tablet 50 mg  50 mg Oral QHS Motley-Mangrum, Jadeka A, PMHNP   50 mg at 05/11/23 2216   PTA Medications: Medications Prior to Admission  Medication Sig Dispense Refill Last Dose   amoxicillin-clavulanate (AUGMENTIN) 875-125 MG tablet Take 1 tablet by mouth 2 (two) times daily. (Patient not taking: Reported on 05/06/2023) 20 tablet 0 Not Taking   bacitracin ointment Apply topically 2 (two) times daily. (Patient not taking: Reported on 05/11/2023) 120 g 0 Not Taking   benztropine (COGENTIN) 1 MG tablet Take 1 tablet (1 mg total) by mouth daily. (Patient not taking: Reported on 05/11/2023) 30 tablet 0 Not Taking   cariprazine (VRAYLAR) 1.5 MG capsule Take 1 capsule (1.5 mg total) by mouth daily. (Patient not taking: Reported on 05/11/2023) 30 capsule 0 Not Taking   hydrOXYzine (ATARAX) 25 MG tablet Take 1 tablet (25 mg total) by mouth 3 (three) times daily as needed for anxiety. (Patient not taking: Reported on 05/11/2023) 30 tablet 0 Not Taking   risperiDONE (RISPERDAL) 1 MG tablet Take 1 tablet (1 mg total) by mouth daily. (Patient not taking: Reported on 05/11/2023) 30  tablet 0 Not Taking   risperiDONE (RISPERDAL) 3 MG tablet Take 1 tablet (3 mg total) by mouth at bedtime. (Patient not taking: Reported on 05/11/2023) 30 tablet 0 Not Taking   traZODone (DESYREL) 50 MG tablet Take 1 tablet (50 mg total) by mouth at bedtime as needed for up to 15 days for sleep. (Patient not taking: Reported on 05/11/2023) 15 tablet 0 Not Taking    Patient Stressors: Financial difficulties   Substance abuse    Patient Strengths: Careers information officer for treatment/growth   Treatment Modalities: Medication Management, Group therapy, Case management,  1 to 1 session with clinician, Psychoeducation, Recreational therapy.   Physician Treatment Plan for Primary Diagnosis: Bipolar 1 disorder (HCC) Long  Term Goal(s): Improvement in symptoms so as ready for discharge   Short Term Goals: Ability to maintain clinical measurements within normal limits will improve Compliance with prescribed medications will improve Ability to verbalize feelings will improve Ability to demonstrate self-control will improve  Medication Management: Evaluate patient's response, side effects, and tolerance of medication regimen.  Therapeutic Interventions: 1 to 1 sessions, Unit Group sessions and Medication administration.  Evaluation of Outcomes: Progressing  Physician Treatment Plan for Secondary Diagnosis: Principal Problem:   Bipolar 1 disorder (HCC)  Long Term Goal(s): Improvement in symptoms so as ready for discharge   Short Term Goals: Ability to maintain clinical measurements within normal limits will improve Compliance with prescribed medications will improve Ability to verbalize feelings will improve Ability to demonstrate self-control will improve     Medication Management: Evaluate patient's response, side effects, and tolerance of medication regimen.  Therapeutic Interventions: 1 to 1 sessions, Unit Group sessions and Medication administration.  Evaluation of Outcomes: Progressing   RN Treatment Plan for Primary Diagnosis: Bipolar 1 disorder (HCC) Long Term Goal(s): Knowledge of disease and therapeutic regimen to maintain health will improve  Short Term Goals: Ability to remain free from injury will improve, Ability to verbalize frustration and anger appropriately will improve, Ability to demonstrate self-control, Ability to participate in decision making will improve, Ability to verbalize feelings will improve, Ability to disclose and discuss suicidal ideas, Ability to identify and develop effective coping behaviors will improve, and Compliance with prescribed medications will improve  Medication Management: RN will administer medications as ordered by provider, will assess and evaluate  patient's response and provide education to patient for prescribed medication. RN will report any adverse and/or side effects to prescribing provider.  Therapeutic Interventions: 1 on 1 counseling sessions, Psychoeducation, Medication administration, Evaluate responses to treatment, Monitor vital signs and CBGs as ordered, Perform/monitor CIWA, COWS, AIMS and Fall Risk screenings as ordered, Perform wound care treatments as ordered.  Evaluation of Outcomes: Progressing   LCSW Treatment Plan for Primary Diagnosis: Bipolar 1 disorder (HCC) Long Term Goal(s): Safe transition to appropriate next level of care at discharge, Engage patient in therapeutic group addressing interpersonal concerns.  Short Term Goals: Engage patient in aftercare planning with referrals and resources, Increase social support, Increase ability to appropriately verbalize feelings, Increase emotional regulation, Facilitate acceptance of mental health diagnosis and concerns, Facilitate patient progression through stages of change regarding substance use diagnoses and concerns, Identify triggers associated with mental health/substance abuse issues, and Increase skills for wellness and recovery  Therapeutic Interventions: Assess for all discharge needs, 1 to 1 time with Social worker, Explore available resources and support systems, Assess for adequacy in community support network, Educate family and significant other(s) on suicide prevention, Complete Psychosocial Assessment,  Interpersonal group therapy.  Evaluation of Outcomes: Progressing   Progress in Treatment: Attending groups: Yes. Participating in groups: Yes. Taking medication as prescribed: Yes. Toleration medication: Yes. Family/Significant other contact made: No, will contact:  when given permission. Patient understands diagnosis: Yes. Discussing patient identified problems/goals with staff: Yes. Medical problems stabilized or resolved: Yes. Denies  suicidal/homicidal ideation: Yes. Issues/concerns per patient self-inventory: No. Other: none.  New problem(s) identified: No, Describe:  none identified.  New Short Term/Long Term Goal(s): elimination of symptoms of psychosis, medication management for mood stabilization; elimination of SI thoughts; development of comprehensive mental wellness/sobriety plan.  Patient Goals:  "Getting back to me, happy, happy healthy. Plan is to get re-balanced, reset, renewed."  Discharge Plan or Barriers: CSW will assist pt with development of an appropriate aftercare/discharge plan.   Reason for Continuation of Hospitalization: Hallucinations Medication stabilization Suicidal ideation  Estimated Length of Stay: 1-7 days  Last 3 [place] Suicide Severity Risk Score: Flowsheet Row Admission (Current) from 05/11/2023 in [facility] MEDICINE ED from 05/06/2023 in [facility] Emergency Department at [facility] ED from 04/25/2023 in [facility] at [facility] RISK CATEGORY No Risk No Risk No Risk       Last PHQ 2/9 Scores:    10/27/2020    9:37 AM 11/06/2015    1:25 PM 05/01/2015    2:09 PM  Depression screen PHQ 2/9  Decreased Interest 3 0 0  Down, Depressed, Hopeless 3 0 0  PHQ - 2 Score 6 0 0  Altered sleeping 3    Tired, decreased energy 3    Change in appetite 2    Feeling bad or failure about yourself  2    Trouble concentrating 3    Moving slowly or fidgety/restless 1    Suicidal thoughts 2    PHQ-9 Score 22    Difficult doing work/chores Extremely dIfficult      Scribe for Treatment Team: Glenis Smoker, LCSW 05/12/2023 11:15 AM

## 2023-05-12 NOTE — Group Note (Signed)
Recreation Therapy Group Note   Group Topic:Leisure Education  Group Date: 05/12/2023 Start Time: 1000 End Time: 1100 Facilitators: Rosina Lowenstein, LRT, CTRS Location:  Craft Room  Group Description: Leisure. Patients were given the option to choose from coloring mandalas, singing karaoke, playing cards, or making origami. LRT and pts discussed the meaning of leisure, the importance of participating in leisure during their free time/when they're outside of the hospital, as well as how our leisure interests can also serve as coping skills. Pt identified two leisure interests and shared with the group.   Goal Area(s) Addressed:  Patient will identify a current leisure interest.  Patient will learn the definition of "leisure". Patient will practice making a positive decision. Patient will have the opportunity to try a new leisure activity. Patient will communicate with peers and LRT.   Affect/Mood: Appropriate and Happy   Participation Level: Active and Engaged   Participation Quality: Independent   Behavior: Appropriate and Eager   Speech/Thought Process: Coherent   Insight: Good   Judgement: Good   Modes of Intervention: Activity   Patient Response to Interventions:  Attentive, Engaged, Interested , and Receptive   Education Outcome:  Acknowledges education   Clinical Observations/Individualized Feedback: Alexsys was active in their participation of session activities and group discussion. Pt identified "exercise, craft, and journal" as things that she does in her free time. Pt chose to sing karaoke and dance while in group. Pt interacted well with LRT duration of session.   Plan: Continue to engage patient in RT group sessions 2-3x/week.   Rosina Lowenstein, LRT, CTRS 05/12/2023 11:25 AM

## 2023-05-12 NOTE — Progress Notes (Signed)
Patient ID: Ana Best, female   DOB: 1985/12/18, 37 y.o.   MRN: [id number] P follow-up after medicine consult.  Internal medicine physician saw the patient for bilateral ear pain and has diagnosed otitis media and otitis externa.  Antibiotics prescribed.  Patient already saying she feels better.  Patient was asking about Adderall and I told her I would not be restarting that medicine while she is in the hospital.

## 2023-05-12 NOTE — Progress Notes (Signed)
The patient remained in her room, resting in bed.  She accepted her evening medications as ordered and denied thoughts of harming herself.  Ana Best was calm and cooperative.  No concerns were voiced and no unsafe behaviors were observed.  Mood was stable and euthymic.

## 2023-05-12 NOTE — [facility] Suicide Risk Assessment (Signed)
[facility] INPATIENT:  Family/Significant Other Suicide Prevention Education  Suicide Prevention Education:  Patient Refusal for Family/Significant Other Suicide Prevention Education: The patient Ana Best has refused to provide written consent for family/significant other to be provided Family/Significant Other Suicide Prevention Education during admission and/or prior to discharge.  Physician notified.  Elza Rafter 05/12/2023, 4:36 PM

## 2023-05-12 NOTE — Plan of Care (Signed)
D- Patient alert and oriented. Patient presents in a pleasant mood on assessment stating that she slept "great, like a baby", and had complaints of ear pain, as well as left heel pain. Patient did not request any PRN pain medication at this time. Patient denies any signs/symptoms of depression and anxiety. Patient also denies SI, HI, AVH, and pain at this time. Patient had no stated goals for today.  A- Scheduled medications administered to patient, per MD orders. Support and encouragement provided.  Routine safety checks conducted every 15 minutes.  Patient informed to notify staff with problems or concerns.  R- No adverse drug reactions noted. Patient contracts for safety at this time. Patient compliant with medications and treatment plan. Patient receptive, calm, and cooperative. Patient interacts well with others on the unit.  Patient remains safe at this time.  Problem: Education: Goal: Knowledge of General Education information will improve Description: Including pain rating scale, medication(s)/side effects and non-pharmacologic comfort measures Outcome: Progressing   Problem: Health Behavior/Discharge Planning: Goal: Ability to manage health-related needs will improve Outcome: Progressing   Problem: Clinical Measurements: Goal: Ability to maintain clinical measurements within normal limits will improve Outcome: Progressing Goal: Will remain free from infection Outcome: Progressing Goal: Diagnostic test results will improve Outcome: Progressing Goal: Respiratory complications will improve Outcome: Progressing Goal: Cardiovascular complication will be avoided Outcome: Progressing   Problem: Activity: Goal: Risk for activity intolerance will decrease Outcome: Progressing   Problem: Nutrition: Goal: Adequate nutrition will be maintained Outcome: Progressing   Problem: Coping: Goal: Level of anxiety will decrease Outcome: Progressing   Problem: Elimination: Goal: Will not  experience complications related to bowel motility Outcome: Progressing Goal: Will not experience complications related to urinary retention Outcome: Progressing   Problem: Pain Managment: Goal: General experience of comfort will improve Outcome: Progressing   Problem: Safety: Goal: Ability to remain free from injury will improve Outcome: Progressing   Problem: Skin Integrity: Goal: Risk for impaired skin integrity will decrease Outcome: Progressing   Problem: Education: Goal: Knowledge of [place] [facility] Education information/materials will improve Outcome: Progressing Goal: Emotional status will improve Outcome: Progressing Goal: Mental status will improve Outcome: Progressing Goal: Verbalization of understanding the information provided will improve Outcome: Progressing   Problem: Safety: Goal: Periods of time without injury will increase Outcome: Progressing   Problem: Self-Concept: Goal: Ability to identify factors that promote anxiety will improve Outcome: Progressing Goal: Level of anxiety will decrease Outcome: Progressing Goal: Ability to modify response to factors that promote anxiety will improve Outcome: Progressing

## 2023-05-13 DIAGNOSIS — F319 Bipolar disorder, unspecified: Secondary | ICD-10-CM | POA: Diagnosis not present

## 2023-05-13 NOTE — Progress Notes (Signed)
The patient was visible in the milieu, socialized, and accepted medications as ordered.  She did not attend evening group and fell asleep prior to 2130.  Ana Best displayed occasional mood lability, but for the most part was calm and pleasant.  She denied thoughts of harming herself and others.  According to the patient, her bilateral ear infection has improved (with a decrease in pain).  No complaints or concerns noted during the evening.

## 2023-05-13 NOTE — Progress Notes (Signed)
I assumed care for Ana Best at about 08:00.  She was resting in her bed, initially did not want to participate in any meaningful interview, but later took her meds and was out in the day area around lunch time. She received prn Tylenol and Nicotene Lozenge twice, denied any avh/hi/si, continues to tolerate antibiotics well. She participated appropriately in all unit activities. She is being monitored as ordered.

## 2023-05-13 NOTE — Progress Notes (Signed)
The patient was visible in the milieu, cooperative, and pleasant.  She struggled with bilateral inner ear pain and orders were placed for antibiotics and ear drops.  Ana Best voiced hopefulness that her ear infection will heal prior to her discharge.  The patient's mood was stable and no behavioral issues were observed.  Ana Best fell asleep before 2200 and slept until the morning.  She refused her nighttime trazodone, stating that it made her feel fuzzy.

## 2023-05-13 NOTE — Progress Notes (Addendum)
[facility] MD Progress Note  05/13/2023 8:52 AM Ana Best  MRN:  [id number] Subjective:.    Patient is being seen for the first time today.  She recalls the events that had transpired prior to her admission.  Says that her dog [place] outside [place] the patient showered and had a towel around her.  "This made me look like a crazy person."  Says that she was scared and so far police not usually a violent person.  Says that she had no suicidal thoughts and actually did not wrap a towel around her neck.  She denies recent mania or psychosis.  Eating well sleeping well.  Taking her medications.  At home her water has been cut off and her air conditioner is not working. She was showering at her local gym.   Principal Problem: Bipolar 1 disorder (HCC) Diagnosis: Principal Problem:   Bipolar 1 disorder (HCC) Active Problems:   BOM (bilateral otitis media)  Total Time spent with patient: 30 minutes  Past Psychiatric History: Past history of bipolar disorder or schizoaffective disorder.  Past Medical History:  Past Medical History:  Diagnosis Date   Anxiety    Bipolar 1 disorder (HCC)    Depression    Hypertension     Past Surgical History:  Procedure Laterality Date   DILATION AND CURETTAGE OF UTERUS     TONSILLECTOMY     WISDOM TOOTH EXTRACTION     Family History:  Family History  Problem Relation Age of Onset   Depression Father    Anxiety disorder Father    ADD / ADHD Brother    Family Psychiatric  History: See previous Social History:  Social History   Substance and Sexual Activity  Alcohol Use Yes   Comment: Pt denies substance use     Social History   Substance and Sexual Activity  Drug Use Yes   Types: Marijuana   Comment: Pt denies substance use    Social History   Socioeconomic History   Marital status: Single    Spouse name: Not on file   Number of children: Not on file   Years of education: Not on file   Highest education  level: Not on file  Occupational History   Not on file  Tobacco Use   Smoking status: Former    Packs/day: 0.50    Years: 10.00    Additional pack years: 0.00    Total pack years: 5.00    Types: Cigarettes    Quit date: 03/08/2014    Years since quitting: 9.1   Smokeless tobacco: Never  Vaping Use   Vaping Use: Never used  Substance and Sexual Activity   Alcohol use: Yes    Comment: Pt denies substance use   Drug use: Yes    Types: Marijuana    Comment: Pt denies substance use   Sexual activity: Yes    Partners: Male    Comment: number of sex partners in the last 12 months  1  Other Topics Concern   Not on file  Social History Narrative   ** Merged History Encounter **       Social Determinants of Health   Financial Resource Strain: Not on file  Food Insecurity: Food Insecurity Present (05/11/2023)   Hunger Vital Sign    Worried About Running Out of Food in the Last Year: Often true    Ran Out of Food in the Last Year: Often true  Transportation Needs: No Transportation Needs (  05/11/2023)   PRAPARE - Administrator, Civil Service (Medical): No    Lack of Transportation (Non-Medical): No  Physical Activity: Not on file  Stress: Not on file  Social Connections: Not on file   Additional Social History:                         Sleep: Fair  Appetite:  Fair  Current Medications: Current Facility-Administered Medications  Medication Dose Route Frequency Provider Last Rate Last Admin   acetaminophen (TYLENOL) tablet 650 mg  650 mg Oral Q6H PRN Motley-Mangrum, Jadeka A, PMHNP   650 mg at 05/12/23 2029   alum & mag hydroxide-simeth (MAALOX/MYLANTA) 200-200-20 MG/5ML suspension 30 mL  30 mL Oral Q4H PRN Motley-Mangrum, Jadeka A, PMHNP       amoxicillin-clavulanate (AUGMENTIN) 875-125 MG per tablet 1 tablet  1 tablet Oral Q12H Floydene Flock, MD   1 tablet at 05/13/23 8119   bacitracin ointment   Topical BID Motley-Mangrum, Ezra Sites, PMHNP   1  Application at 05/13/23 1478   diphenhydrAMINE (BENADRYL) capsule 50 mg  50 mg Oral TID PRN Motley-Mangrum, Geralynn Ochs A, PMHNP       Or   diphenhydrAMINE (BENADRYL) injection 50 mg  50 mg Intramuscular TID PRN Motley-Mangrum, Jadeka A, PMHNP       divalproex (DEPAKOTE) DR tablet 500 mg  500 mg Oral Q12H Clapacs, John T, MD   500 mg at 05/13/23 2956   haloperidol (HALDOL) tablet 5 mg  5 mg Oral TID PRN Motley-Mangrum, Jadeka A, PMHNP       Or   haloperidol lactate (HALDOL) injection 5 mg  5 mg Intramuscular TID PRN Motley-Mangrum, Jadeka A, PMHNP       hydrOXYzine (ATARAX) tablet 50 mg  50 mg Oral TID PRN Motley-Mangrum, Jadeka A, PMHNP   50 mg at 05/12/23 1509   LORazepam (ATIVAN) tablet 2 mg  2 mg Oral TID PRN Motley-Mangrum, Jadeka A, PMHNP   2 mg at 05/12/23 1509   Or   LORazepam (ATIVAN) injection 2 mg  2 mg Intramuscular TID PRN Motley-Mangrum, Jadeka A, PMHNP       magnesium hydroxide (MILK OF MAGNESIA) suspension 30 mL  30 mL Oral Daily PRN Motley-Mangrum, Jadeka A, PMHNP       neomycin-polymyxin-hydrocortisone (CORTISPORIN) OTIC (EAR) solution 4 drop  4 drop Both EARS Q6H Floydene Flock, MD   4 drop at 05/13/23 0602   nicotine (NICODERM CQ - dosed in mg/24 hours) patch 21 mg  21 mg Transdermal Daily Clapacs, Jackquline Denmark, MD   21 mg at 05/13/23 2130   nicotine polacrilex (NICORETTE) gum 2 mg  2 mg Oral PRN Clapacs, Jackquline Denmark, MD   2 mg at 05/13/23 0834   risperiDONE (RISPERDAL) tablet 2 mg  2 mg Oral QHS Clapacs, John T, MD   2 mg at 05/12/23 2131   traZODone (DESYREL) tablet 50 mg  50 mg Oral QHS Motley-Mangrum, Jadeka A, PMHNP   50 mg at 05/11/23 2216    Lab Results:  Results for orders placed or performed during the hospital encounter of 05/11/23 (from the past 48 hour(s))  Lipid panel     Status: None   Collection Time: 05/12/23  7:10 AM  Result Value Ref Range   Cholesterol 156 0 - 200 mg/dL   Triglycerides 865 <784 mg/dL   HDL 41 >69 mg/dL   Total CHOL/HDL Ratio 3.8 RATIO   VLDL 26 0  -  40 mg/dL   LDL Cholesterol 89 0 - 99 mg/dL    Comment:        Total Cholesterol/HDL:CHD Risk Coronary Heart Disease Risk Table                     Men   Women  1/2 Average Risk   3.4   3.3  Average Risk       5.0   4.4  2 X Average Risk   9.6   7.1  3 X Average Risk  23.4   11.0        Use the calculated Patient Ratio above and the CHD Risk Table to determine the patient's CHD Risk.        ATP III CLASSIFICATION (LDL):  <100     mg/dL   Optimal  161-096  mg/dL   Near or Above                    Optimal  130-159  mg/dL   Borderline  045-409  mg/dL   High  >811     mg/dL   Very High Performed at [facility], [address] [place], [place] [address]   Hemoglobin A1c     Status: None   Collection Time: 05/12/23  7:10 AM  Result Value Ref Range   Hgb A1c MFr Bld 4.9 4.8 - 5.6 %    Comment: (NOTE) Pre diabetes:          5.7%-6.4%  Diabetes:              >6.4%  Glycemic control for   <7.0% adults with diabetes    Mean Plasma Glucose 93.93 mg/dL    Comment: Performed at [facility], [address], [place], [place] [address]    Blood Alcohol level:  Lab Results  Component Value Date   ETH <10 05/06/2023   ETH <10 09/20/2022    Metabolic Disorder Labs: Lab Results  Component Value Date   HGBA1C 4.9 05/12/2023   MPG 93.93 05/12/2023   MPG 88.19 09/24/2022   No results found for: "PROLACTIN" Lab Results  Component Value Date   CHOL 156 05/12/2023   TRIG 132 05/12/2023   HDL 41 05/12/2023   CHOLHDL 3.8 05/12/2023   VLDL 26 05/12/2023   LDLCALC 89 05/12/2023   LDLCALC 94 09/24/2022    Physical Findings: AIMS:  , ,  ,  ,    CIWA:    COWS:     Musculoskeletal: Strength & Muscle Tone: within normal limits Gait & Station: normal Patient leans: N/A  Psychiatric Specialty Exam:  Presentation  General Appearance:  Appropriate for Environment  Eye Contact: Good  Speech: Clear and Coherent  Speech  Volume: Normal  Handedness: Right   Mood and Affect  Mood: Euthymic  Affect: Appropriate   Thought Process  Thought Processes: Coherent  Descriptions of Associations:Intact  Orientation:Full (Time, Place and Person)  Thought Content:Logical  History of Schizophrenia/Schizoaffective disorder:No  Duration of Psychotic Symptoms:N/A  Hallucinations:No data recorded Ideas of Reference:None  Suicidal Thoughts:No data recorded Homicidal Thoughts:No data recorded  Sensorium  Memory: Immediate Good  Judgment: Fair  Insight: Lacking   Executive Functions  Concentration: Fair  Attention Span: Fair  Recall: [employer] of Knowledge: Fair  Language: Fair   Psychomotor Activity  Psychomotor Activity:No data recorded  Assets  Assets: Communication Skills; Social Support   Sleep  Sleep:No data recorded   Physical Exam: Physical  Exam Vitals and nursing note reviewed.  Constitutional:      Appearance: Normal appearance.  HENT:     Head: Normocephalic and atraumatic.     Mouth/Throat:     Pharynx: Oropharynx is clear.  Eyes:     Pupils: Pupils are equal, round, and reactive to light.  Cardiovascular:     Rate and Rhythm: Normal rate and regular rhythm.  Pulmonary:     Effort: Pulmonary effort is normal.     Breath sounds: Normal breath sounds.  Abdominal:     General: Abdomen is flat.     Palpations: Abdomen is soft.  Musculoskeletal:        General: Normal range of motion.  Skin:    General: Skin is warm and dry.  Neurological:     General: No focal deficit present.     Mental Status: She is alert. Mental status is at baseline.  Psychiatric:        Attention and Perception: Attention normal.        Mood and Affect: Mood is elated.        Speech: Speech normal.        Behavior: Behavior normal.        Thought Content: Thought content normal.        Cognition and Memory: Cognition normal.    Review of Systems  Constitutional:  Negative.   HENT: Negative.    Eyes: Negative.   Respiratory: Negative.    Cardiovascular: Negative.   Gastrointestinal: Negative.   Musculoskeletal: Negative.   Skin: Negative.   Neurological: Negative.   Psychiatric/Behavioral: Negative.     Blood pressure 103/65, pulse (!) 107, temperature 98.7 F (37.1 C), temperature source Oral, resp. rate 20, height 5\' 7"  (1.702 m), weight 68 kg, last menstrual period 04/24/2023, SpO2 100 %. Body mass index is 23.49 kg/m.   Treatment Plan Summary: Medication management and Plan patient is on Depakote and Risperdal.  Tolerating medicine fine.  No active behavior problems.  Consultation requested from the hospitalist service to take a look at her ears.  Blood pressure is on the low side but the patient is not dizzy or complaining of any symptoms from it.  No change otherwise for treatment.  Pending the results of the consult to see if we need to do anything different for her ears.  6/22 No changes  Reggie Pile, MD 05/13/2023, 8:52 AM

## 2023-05-13 NOTE — Group Note (Signed)
LCSW Group Therapy Note  Group Date: 05/13/2023 Start Time: 1455 End Time: 1530   Type of Therapy and Topic:  Group Therapy - Healthy vs Unhealthy Coping Skills  Participation Level:  Minimal   Description of Group The focus of this group was to determine what unhealthy coping techniques typically are used by group members and what healthy coping techniques would be helpful in coping with various problems. Patients were guided in becoming aware of the differences between healthy and unhealthy coping techniques. Patients were asked to identify 2-3 healthy coping skills they would like to learn to use more effectively.  Therapeutic Goals Patients learned that coping is what human beings do all day long to deal with various situations in their lives Patients defined and discussed healthy vs unhealthy coping techniques Patients identified their preferred coping techniques and identified whether these were healthy or unhealthy Patients determined 2-3 healthy coping skills they would like to become more familiar with and use more often. Patients provided support and ideas to each other   Summary of Patient Progress:  The patient attended group but left a little early. The patient stated that she spends her free time outside. The patient stated that she uses her bible to cope.    Marshell Levan, LCSWA 05/13/2023  3:39 PM

## 2023-05-13 NOTE — BHH Counselor (Signed)
The LCSWA spoke with the patients Ana Best [phone] mother after she requested a social worker contact her. The patients mother informed the LCSWA that she is concerned about the patients mental health. Stating that the patient conceals her delusions and thoughts well. Stating that she know what to say to put on a front about her mental state. Mom stated that she believes she is the patients trigger and that she has become afraid of the patient. She stated that she bought the patient a house which she closed herself in and destroyed. Mom stated that the patient is not allowed to return to the house or return to her house because of the fear she has of the patent.Mom stated that she is looking into getting the patient committed for 30 day treatment and wanted to see if we could set her up. The LCSWA informed mom that we would only be able to provide the patient with a list of resources upon the patients request. Mom is concerned about the patient not having anywhere to live once discharged.  Mom stated that patient is unable to take care of herself. Stating that she can't keep a job, don't have bank account or pay bills.     Patric Dykes, LCSWA 05/13/2023 5:28 pm

## 2023-05-14 DIAGNOSIS — F319 Bipolar disorder, unspecified: Secondary | ICD-10-CM | POA: Diagnosis not present

## 2023-05-14 NOTE — Progress Notes (Signed)
[facility] MD Progress Note  05/14/2023 8:01 AM Ana Best  MRN:  [id number] Subjective:.   6/23 Patient reports having a relatively rough day yesterday due to having a panic attack was brought on by something that she saw on TV but does not recall at this time.  Also reports that that would noises trigger her as well.  States that her ears were also hurting.  Hopeful to attend more groups today to distract from negative thoughts.  She tolerated medication well.  No racing thoughts hallucinations or paranoia.  Eating well.  No conflicts on the unit.  6/22 Patient is being seen for the first time today.  She recalls the events that had transpired prior to her admission.  Says that her [place] outside [place] the patient showered and had a towel around her.  "This made me look like a crazy person."  Says that she was scared and so far police not usually a violent person.  Says that she had no suicidal thoughts and actually did not wrap a towel around her neck.  She denies recent mania or psychosis.  Eating well sleeping well.  Taking her medications.  At home her water has been cut off and her air conditioner is not working. She was showering at her local gym.   Principal Problem: Bipolar 1 disorder (HCC) Diagnosis: Principal Problem:   Bipolar 1 disorder (HCC) Active Problems:   BOM (bilateral otitis media)  Total Time spent with patient: 28 mins  Past Psychiatric History: Past history of bipolar disorder or schizoaffective disorder.  Past Medical History:  Past Medical History:  Diagnosis Date   Anxiety    Bipolar 1 disorder (HCC)    Depression    Hypertension     Past Surgical History:  Procedure Laterality Date   DILATION AND CURETTAGE OF UTERUS     TONSILLECTOMY     WISDOM TOOTH EXTRACTION     Family History:  Family History  Problem Relation Age of Onset   Depression Father    Anxiety disorder Father    ADD / ADHD Brother    Family  Psychiatric  History: See previous Social History:  Social History   Substance and Sexual Activity  Alcohol Use Yes   Comment: Pt denies substance use     Social History   Substance and Sexual Activity  Drug Use Yes   Types: Marijuana   Comment: Pt denies substance use    Social History   Socioeconomic History   Marital status: Single    Spouse name: Not on file   Number of children: Not on file   Years of education: Not on file   Highest education level: Not on file  Occupational History   Not on file  Tobacco Use   Smoking status: Former    Packs/day: 0.50    Years: 10.00    Additional pack years: 0.00    Total pack years: 5.00    Types: Cigarettes    Quit date: 03/08/2014    Years since quitting: 9.1   Smokeless tobacco: Never  Vaping Use   Vaping Use: Never used  Substance and Sexual Activity   Alcohol use: Yes    Comment: Pt denies substance use   Drug use: Yes    Types: Marijuana    Comment: Pt denies substance use   Sexual activity: Yes    Partners: Male    Comment: number of sex partners in the last 12 months  1  Other Topics Concern   Not on file  Social History Narrative   ** Merged History Encounter **       Social Determinants of Health   Financial Resource Strain: Not on file  Food Insecurity: Food Insecurity Present (05/11/2023)   Hunger Vital Sign    Worried About Running Out of Food in the Last Year: Often true    Ran Out of Food in the Last Year: Often true  Transportation Needs: No Transportation Needs (05/11/2023)   PRAPARE - Administrator, Civil Service (Medical): No    Lack of Transportation (Non-Medical): No  Physical Activity: Not on file  Stress: Not on file  Social Connections: Not on file   Additional Social History:                         Sleep: Fair  Appetite:  Fair  Current Medications: Current Facility-Administered Medications  Medication Dose Route Frequency Provider Last Rate Last Admin    acetaminophen (TYLENOL) tablet 650 mg  650 mg Oral Q6H PRN Motley-Mangrum, Jadeka A, PMHNP   650 mg at 05/13/23 1516   alum & mag hydroxide-simeth (MAALOX/MYLANTA) 200-200-20 MG/5ML suspension 30 mL  30 mL Oral Q4H PRN Motley-Mangrum, Jadeka A, PMHNP       amoxicillin-clavulanate (AUGMENTIN) 875-125 MG per tablet 1 tablet  1 tablet Oral Q12H Floydene Flock, MD   1 tablet at 05/14/23 0801   bacitracin ointment   Topical BID Motley-Mangrum, Ezra Sites, PMHNP   1 Application at 05/14/23 0800   diphenhydrAMINE (BENADRYL) capsule 50 mg  50 mg Oral TID PRN Motley-Mangrum, Jadeka A, PMHNP   50 mg at 05/13/23 1719   Or   diphenhydrAMINE (BENADRYL) injection 50 mg  50 mg Intramuscular TID PRN Motley-Mangrum, Jadeka A, PMHNP       divalproex (DEPAKOTE) DR tablet 500 mg  500 mg Oral Q12H Clapacs, John T, MD   500 mg at 05/13/23 1952   haloperidol (HALDOL) tablet 5 mg  5 mg Oral TID PRN Motley-Mangrum, Ezra Sites, PMHNP   5 mg at 05/13/23 1720   Or   haloperidol lactate (HALDOL) injection 5 mg  5 mg Intramuscular TID PRN Motley-Mangrum, Jadeka A, PMHNP       hydrOXYzine (ATARAX) tablet 50 mg  50 mg Oral TID PRN Motley-Mangrum, Jadeka A, PMHNP   50 mg at 05/12/23 1509   LORazepam (ATIVAN) tablet 2 mg  2 mg Oral TID PRN Motley-Mangrum, Jadeka A, PMHNP   2 mg at 05/13/23 1718   Or   LORazepam (ATIVAN) injection 2 mg  2 mg Intramuscular TID PRN Motley-Mangrum, Jadeka A, PMHNP       magnesium hydroxide (MILK OF MAGNESIA) suspension 30 mL  30 mL Oral Daily PRN Motley-Mangrum, Jadeka A, PMHNP       neomycin-polymyxin-hydrocortisone (CORTISPORIN) OTIC (EAR) solution 4 drop  4 drop Both EARS Q6H Floydene Flock, MD   4 drop at 05/14/23 0630   nicotine (NICODERM CQ - dosed in mg/24 hours) patch 21 mg  21 mg Transdermal Daily Clapacs, John T, MD   21 mg at 05/14/23 0800   nicotine polacrilex (NICORETTE) gum 2 mg  2 mg Oral PRN Clapacs, Jackquline Denmark, MD   2 mg at 05/13/23 1226   risperiDONE (RISPERDAL) tablet 2 mg  2 mg Oral  QHS Clapacs, John T, MD   2 mg at 05/13/23 2313   traZODone (DESYREL) tablet 50 mg  50  mg Oral QHS Motley-Mangrum, Jadeka A, PMHNP   50 mg at 05/11/23 2216    Lab Results:  No results found for this or any previous visit (from the past 48 hour(s)).   Blood Alcohol level:  Lab Results  Component Value Date   ETH <10 05/06/2023   ETH <10 09/20/2022    Metabolic Disorder Labs: Lab Results  Component Value Date   HGBA1C 4.9 05/12/2023   MPG 93.93 05/12/2023   MPG 88.19 09/24/2022   No results found for: "PROLACTIN" Lab Results  Component Value Date   CHOL 156 05/12/2023   TRIG 132 05/12/2023   HDL 41 05/12/2023   CHOLHDL 3.8 05/12/2023   VLDL 26 05/12/2023   LDLCALC 89 05/12/2023   LDLCALC 94 09/24/2022    Physical Findings: AIMS:  , ,  ,  ,    CIWA:    COWS:     Musculoskeletal: Strength & Muscle Tone: within normal limits Gait & Station: normal Patient leans: N/A  Psychiatric Specialty Exam:  Presentation  General Appearance:  Appropriate for Environment  Eye Contact: Good  Speech: Clear and Coherent  Speech Volume: Normal  Handedness: Right   Mood and Affect  Mood: anxious  Affect: Appropriate   Thought Process  Thought Processes: Coherent  Descriptions of Associations:Intact  Orientation:Full (Time, Place and Person)  Thought Content:Logical  History of Schizophrenia/Schizoaffective disorder:No  Duration of Psychotic Symptoms:N/A  Hallucinations:No data recorded Ideas of Reference:None  Suicidal Thoughts:No data recorded Homicidal Thoughts:No data recorded  Sensorium  Memory: Immediate Good  Judgment: Fair  Insight: Lacking   Executive Functions  Concentration: Fair  Attention Span: Fair  Recall: [employer] of Knowledge: Fair  Language: Fair   Psychomotor Activity  Psychomotor Activity:No data recorded  Assets  Assets: Communication Skills; Social Support  Physical Exam: Physical  Exam Vitals and nursing note reviewed.  Constitutional:      Appearance: Normal appearance.  HENT:     Head: Normocephalic and atraumatic.     Mouth/Throat:     Pharynx: Oropharynx is clear.  Eyes:     Pupils: Pupils are equal, round, and reactive to light.  Cardiovascular:     Rate and Rhythm: Normal rate and regular rhythm.  Pulmonary:     Effort: Pulmonary effort is normal.     Breath sounds: Normal breath sounds.  Abdominal:     General: Abdomen is flat.     Palpations: Abdomen is soft.  Musculoskeletal:        General: Normal range of motion.  Skin:    General: Skin is warm and dry.  Neurological:     General: No focal deficit present.     Mental Status: She is alert. Mental status is at baseline.  Psychiatric:        Attention and Perception: Attention normal.        Mood and Affect: Mood is elated.        Speech: Speech normal.        Behavior: Behavior normal.        Thought Content: Thought content normal.        Cognition and Memory: Cognition normal.    Review of Systems  Constitutional: Negative.   HENT: Negative.    Eyes: Negative.   Respiratory: Negative.    Cardiovascular: Negative.   Gastrointestinal: Negative.   Musculoskeletal: Negative.   Skin: Negative.   Neurological: Negative.   Psychiatric/Behavioral: Negative.     Blood pressure 110/74, pulse 73, temperature 97.6 F (  36.4 C), temperature source Oral, resp. rate 16, height 5\' 7"  (1.702 m), weight 68 kg, last menstrual period 04/24/2023, SpO2 100 %. Body mass index is 23.49 kg/m.   Treatment Plan Summary: Medication management and Plan patient is on Depakote and Risperdal.  Tolerating medicine fine.  No active behavior problems.  Consultation requested from the hospitalist service to take a look at her ears.  Blood pressure is on the low side but the patient is not dizzy or complaining of any symptoms from it.  No change otherwise for treatment.  Pending the results of the consult to see if we  need to do anything different for her ears.  6/23 Depakote level Wednesday morning Likely discharge on Wednesday  6/22 No changes  Reggie Pile, MD 05/14/2023, 8:01 AM

## 2023-05-14 NOTE — Group Note (Signed)
Date:  05/14/2023 Time:  11:51 AM  Group Topic/Focus:  Activity Group   Intention of this group is to encourage patients to come outside to the courtyard and get some fresh air and some exercise.     Participation Level:  Active  Participation Quality:  Appropriate  Affect:  Appropriate  Cognitive:  Appropriate  Insight: Appropriate  Engagement in Group:  Engaged  Modes of Intervention:  Activity  Additional Comments:    Grace Haggart M Green [place] 05/14/2023, 11:51 AM  

## 2023-05-14 NOTE — Group Note (Signed)
Date:  05/14/2023 Time:  9:30 PM  Group Topic/Focus:  Rediscovering Joy:   The focus of this group is to explore various ways to relieve stress in a positive manner.    Participation Level:  Active  Participation Quality:  Appropriate  Affect:  Appropriate  Cognitive:  Alert and Appropriate  Insight: Appropriate  Engagement in Group:  Improving  Modes of Intervention:  Socialization  Additional Comments:     Daleiza Bacchi 05/14/2023, 9:30 PM

## 2023-05-14 NOTE — Progress Notes (Signed)
I assumed care for Ana Best at about 08:00. She was resting in her bed, later seen after breakfast and during am med pass. She denied any new pain other than her ears, wonders when this will get better but is aware that antibiotics treatment is on course at this time. She has remained fairly self isolative to her room so far today, denied any avh/hi/si at the time of my assessment but reports that she is still depressed. She has been med compliant and has only used prn Nicotene gum thus far this shift. She is being monitored as ordered. She ate both breakfast and lunch and attended to her hygiene needs appropriately

## 2023-05-14 NOTE — Progress Notes (Signed)
The patient socialized, played cards, and watched television with her peers.  Her mood was stable and affect was bright.  Lability recorded earlier in the day appears to have subsided.  Ana Best fell asleep before 2130, accepting her medications as ordered.  She continues to endorse bilateral ear pain.  Drops and antibiotics continue.  No unsafe behaviors or angry outbursts noted.  Encouragement provided.

## 2023-05-15 DIAGNOSIS — F319 Bipolar disorder, unspecified: Secondary | ICD-10-CM | POA: Diagnosis not present

## 2023-05-15 NOTE — Progress Notes (Signed)
[facility] MD Progress Note  05/15/2023 12:15 PM Ana Best  MRN:  [id number] Subjective:.   6/24 Patient reports no mood fluctuations.  No crying spells or outburst.  No inward anger.  Denies racing thoughts or psychosis.  No signs or symptoms of hypomania or mania.  She slept well last night.  We discussed the risks of Depakote in a reproductive female.  Says that she does not want her contraceptives especially the Depo shot.  Indicates that she is in no relationship and is not sexually active.  She is agreeable eventually just to be on Risperdal in that case however we will still get a Depakote level on Wednesday.  She has been calm and cooperative.  Reports having no ear pain now, just feels more tired.  6/23 Patient reports having a relatively rough day yesterday due to having a panic attack was brought on by something that she saw on TV but does not recall at this time.  Also reports that that would noises trigger her as well.  States that her ears were also hurting.  Hopeful to attend more groups today to distract from negative thoughts.  She tolerated medication well.  No racing thoughts hallucinations or paranoia.  Eating well.  No conflicts on the unit.  6/22 Patient is being seen for the first time today.  She recalls the events that had transpired prior to her admission.  Says that her [place] the patient showered and had a towel around her.  "This made me look like a crazy person."  Says that she was scared and so far police not usually a violent person.  Says that she had no suicidal thoughts and actually did not wrap a towel around her neck.  She denies recent mania or psychosis.  Eating well sleeping well.  Taking her medications.  At home her water has been cut off and her air conditioner is not working. She was showering at her local gym.   Principal Problem: Bipolar 1 disorder (HCC) Diagnosis: Principal Problem:   Bipolar 1 disorder  (HCC) Active Problems:   BOM (bilateral otitis media)  Total Time spent with patient: 28 mins  Past Psychiatric History: Past history of bipolar disorder or schizoaffective disorder.  Past Medical History:  Past Medical History:  Diagnosis Date   Anxiety    Bipolar 1 disorder (HCC)    Depression    Hypertension     Past Surgical History:  Procedure Laterality Date   DILATION AND CURETTAGE OF UTERUS     TONSILLECTOMY     WISDOM TOOTH EXTRACTION     Family History:  Family History  Problem Relation Age of Onset   Depression Father    Anxiety disorder Father    ADD / ADHD Brother    Family Psychiatric  History: See previous Social History:  Social History   Substance and Sexual Activity  Alcohol Use Yes   Comment: Pt denies substance use     Social History   Substance and Sexual Activity  Drug Use Yes   Types: Marijuana   Comment: Pt denies substance use    Social History   Socioeconomic History   Marital status: Single    Spouse name: Not on file   Number of children: Not on file   Years of education: Not on file   Highest education level: Not on file  Occupational History   Not on file  Tobacco Use   Smoking status: Former  Packs/day: 0.50    Years: 10.00    Additional pack years: 0.00    Total pack years: 5.00    Types: Cigarettes    Quit date: 03/08/2014    Years since quitting: 9.1   Smokeless tobacco: Never  Vaping Use   Vaping Use: Never used  Substance and Sexual Activity   Alcohol use: Yes    Comment: Pt denies substance use   Drug use: Yes    Types: Marijuana    Comment: Pt denies substance use   Sexual activity: Yes    Partners: Male    Comment: number of sex partners in the last 12 months  1  Other Topics Concern   Not on file  Social History Narrative   ** Merged History Encounter **       Social Determinants of Health   Financial Resource Strain: Not on file  Food Insecurity: Food Insecurity Present (05/11/2023)   Hunger  Vital Sign    Worried About Running Out of Food in the Last Year: Often true    Ran Out of Food in the Last Year: Often true  Transportation Needs: No Transportation Needs (05/11/2023)   PRAPARE - Administrator, Civil Service (Medical): No    Lack of Transportation (Non-Medical): No  Physical Activity: Not on file  Stress: Not on file  Social Connections: Not on file   Additional Social History:                         Sleep: Fair  Appetite:  Fair  Current Medications: Current Facility-Administered Medications  Medication Dose Route Frequency Provider Last Rate Last Admin   acetaminophen (TYLENOL) tablet 650 mg  650 mg Oral Q6H PRN Motley-Mangrum, Jadeka A, PMHNP   650 mg at 05/13/23 1516   alum & mag hydroxide-simeth (MAALOX/MYLANTA) 200-200-20 MG/5ML suspension 30 mL  30 mL Oral Q4H PRN Motley-Mangrum, Jadeka A, PMHNP       amoxicillin-clavulanate (AUGMENTIN) 875-125 MG per tablet 1 tablet  1 tablet Oral Q12H Floydene Flock, MD   1 tablet at 05/15/23 0825   bacitracin ointment   Topical BID Motley-Mangrum, Jadeka A, PMHNP   1 Application at 05/15/23 0826   diphenhydrAMINE (BENADRYL) capsule 50 mg  50 mg Oral TID PRN Motley-Mangrum, Jadeka A, PMHNP   50 mg at 05/14/23 1902   Or   diphenhydrAMINE (BENADRYL) injection 50 mg  50 mg Intramuscular TID PRN Motley-Mangrum, Jadeka A, PMHNP       divalproex (DEPAKOTE) DR tablet 500 mg  500 mg Oral Q12H Clapacs, John T, MD   500 mg at 05/15/23 0825   haloperidol (HALDOL) tablet 5 mg  5 mg Oral TID PRN Motley-Mangrum, Jadeka A, PMHNP   5 mg at 05/14/23 1902   Or   haloperidol lactate (HALDOL) injection 5 mg  5 mg Intramuscular TID PRN Motley-Mangrum, Jadeka A, PMHNP       hydrOXYzine (ATARAX) tablet 50 mg  50 mg Oral TID PRN Motley-Mangrum, Jadeka A, PMHNP   50 mg at 05/12/23 1509   LORazepam (ATIVAN) tablet 2 mg  2 mg Oral TID PRN Motley-Mangrum, Jadeka A, PMHNP   2 mg at 05/14/23 1902   Or   LORazepam (ATIVAN)  injection 2 mg  2 mg Intramuscular TID PRN Motley-Mangrum, Jadeka A, PMHNP       magnesium hydroxide (MILK OF MAGNESIA) suspension 30 mL  30 mL Oral Daily PRN Motley-Mangrum, Ezra Sites, PMHNP  neomycin-polymyxin-hydrocortisone (CORTISPORIN) OTIC (EAR) solution 4 drop  4 drop Both EARS Q6H Floydene Flock, MD   4 drop at 05/15/23 1205   nicotine (NICODERM CQ - dosed in mg/24 hours) patch 21 mg  21 mg Transdermal Daily Clapacs, Jackquline Denmark, MD   21 mg at 05/15/23 0825   nicotine polacrilex (NICORETTE) gum 2 mg  2 mg Oral PRN Clapacs, Jackquline Denmark, MD   2 mg at 05/15/23 1205   risperiDONE (RISPERDAL) tablet 2 mg  2 mg Oral QHS Clapacs, Jackquline Denmark, MD   2 mg at 05/14/23 2111    Lab Results:  No results found for this or any previous visit (from the past 48 hour(s)).   Blood Alcohol level:  Lab Results  Component Value Date   ETH <10 05/06/2023   ETH <10 09/20/2022    Metabolic Disorder Labs: Lab Results  Component Value Date   HGBA1C 4.9 05/12/2023   MPG 93.93 05/12/2023   MPG 88.19 09/24/2022   No results found for: "PROLACTIN" Lab Results  Component Value Date   CHOL 156 05/12/2023   TRIG 132 05/12/2023   HDL 41 05/12/2023   CHOLHDL 3.8 05/12/2023   VLDL 26 05/12/2023   LDLCALC 89 05/12/2023   LDLCALC 94 09/24/2022    Physical Findings: AIMS:  , ,  ,  ,    CIWA:    COWS:     Musculoskeletal: Strength & Muscle Tone: within normal limits Gait & Station: normal Patient leans: N/A  Psychiatric Specialty Exam:  Presentation  General Appearance:  Appropriate for Environment  Eye Contact: Good  Speech: Clear and Coherent  Speech Volume: Normal  Handedness: Right   Mood and Affect  Mood: anxious  Affect: Appropriate   Thought Process  Thought Processes: Coherent  Descriptions of Associations:Intact  Orientation:Full (Time, Place and Person)  Thought Content:Logical  History of Schizophrenia/Schizoaffective disorder:No  Duration of Psychotic  Symptoms:N/A  Hallucinations:No data recorded Ideas of Reference:None  Suicidal Thoughts:No data recorded Homicidal Thoughts:No data recorded  Sensorium  Memory: Immediate Good  Judgment: Fair  Insight: Lacking   Executive Functions  Concentration: Fair  Attention Span: Fair  Recall: [employer] of Knowledge: Fair  Language: Fair   Psychomotor Activity  Psychomotor Activity:No data recorded  Assets  Assets: Communication Skills; Social Support  Physical Exam: Physical Exam Vitals and nursing note reviewed.  Constitutional:      Appearance: Normal appearance.  HENT:     Head: Normocephalic and atraumatic.     Mouth/Throat:     Pharynx: Oropharynx is clear.  Eyes:     Pupils: Pupils are equal, round, and reactive to light.  Cardiovascular:     Rate and Rhythm: Normal rate and regular rhythm.  Pulmonary:     Effort: Pulmonary effort is normal.     Breath sounds: Normal breath sounds.  Abdominal:     General: Abdomen is flat.     Palpations: Abdomen is soft.  Musculoskeletal:        General: Normal range of motion.  Skin:    General: Skin is warm and dry.  Neurological:     General: No focal deficit present.     Mental Status: She is alert. Mental status is at baseline.  Psychiatric:        Attention and Perception: Attention normal.        Mood and Affect: Mood is elated.        Speech: Speech normal.  Behavior: Behavior normal.        Thought Content: Thought content normal.        Cognition and Memory: Cognition normal.    Review of Systems  Constitutional: Negative.   HENT: Negative.    Eyes: Negative.   Respiratory: Negative.    Cardiovascular: Negative.   Gastrointestinal: Negative.   Musculoskeletal: Negative.   Skin: Negative.   Neurological: Negative.   Psychiatric/Behavioral: Negative.     Blood pressure 120/75, pulse 83, temperature 98.7 F (37.1 C), temperature source Oral, resp. rate 16, height 5\' 7"  (1.702 m),  weight 68 kg, last menstrual period 04/24/2023, SpO2 100 %. Body mass index is 23.49 kg/m.   Treatment Plan Summary: Medication management and Plan patient is on Depakote and Risperdal.  Tolerating medicine fine.  No active behavior problems.  Consultation requested from the hospitalist service to take a look at her ears.  Blood pressure is on the low side but the patient is not dizzy or complaining of any symptoms from it.  No change otherwise for treatment.  Pending the results of the consult to see if we need to do anything different for her ears.  6/24 Risks of Depakote reviewed in reproductive females. Discharge on Wednesday Likely will not discharge patient on Depakote now that we have stabilized her on this in addition to Risperdal due to her not wanting to be on oral contraceptive.  6/23 Depakote level Wednesday morning Likely discharge on Wednesday  6/22 No changes  Reggie Pile, MD 05/15/2023, 12:15 PM

## 2023-05-15 NOTE — Group Note (Signed)
Recreation Therapy Group Note   Group Topic:Coping Skills  Group Date: 05/15/2023 Start Time: 1000 End Time: 1045 Facilitators: Rosina Lowenstein, LRT, CTRS Location:  C[facility]  Group Description: Mind Map.  Patient was provided a blank template of a diagram with 32 blank boxes in a tiered system, branching from the center (similar to a bubble chart). LRT directed patients to label the middle of the diagram "Coping Skills". LRT and patients then came up with 8 different coping skills as examples. Pt were directed to record their coping skills in the 2nd tier boxes closest to the center.  Patients would then share their coping skills with the group as LRT wrote them out. LRT gave a handout of 100 different coping skills at the end of group.   Goal Area(s) Addressed: Patients will be able to define "coping skills". Patient will identify new coping skills.  Patient will identify new possible leisure interests.   Affect/Mood: Appropriate   Participation Level: Active and Engaged   Participation Quality: Independent   Behavior: Appropriate, Calm, and Cooperative   Speech/Thought Process: Coherent   Insight: Good   Judgement: Good   Modes of Intervention: Worksheet   Patient Response to Interventions:  Attentive, Engaged, Interested , and Receptive   Education Outcome:  Acknowledges education   Clinical Observations/Individualized Feedback: Ana Best was active in their participation of session activities and group discussion. Pt identified "talk to someone, journal, or exercise" as coping kills. Pt spontaneously contributed to group discussion on more than once occasion while interacting well with LRT and peers duration of session.   Plan: Continue to engage patient in RT group sessions 2-3x/week.   Rosina Lowenstein, LRT, CTRS 05/15/2023 11:21 AM

## 2023-05-15 NOTE — Group Note (Signed)
Date:  05/15/2023 Time:  10:11 AM  Group Topic/Focus:  Goals Group:   The focus of this group is to help patients establish daily goals to achieve during treatment and discuss how the patient can incorporate goal setting into their daily lives to aide in recovery.    Participation Level:  Active  Participation Quality:  Appropriate  Affect:  Appropriate  Cognitive:  Appropriate  Insight: Appropriate  Engagement in Group:  Engaged  Modes of Intervention:  Activity and Socialization  Additional Comments:    Wilford Corner 05/15/2023, 10:11 AM

## 2023-05-15 NOTE — Plan of Care (Signed)
Patient pleasant and cooperative on approach. Appropriate with staff and peers. Denies SI,HI and AVH. Compliant with medications. Appetite and energy level good. ADLs maintained. Support and encouragement given. 

## 2023-05-16 DIAGNOSIS — F319 Bipolar disorder, unspecified: Secondary | ICD-10-CM | POA: Diagnosis not present

## 2023-05-16 MED ORDER — RISPERIDONE 1 MG PO TABS
3.0000 mg | ORAL_TABLET | Freq: Every day | ORAL | Status: DC
  Administered 2023-05-16: 3 mg via ORAL
  Filled 2023-05-16: qty 3

## 2023-05-16 NOTE — Progress Notes (Signed)
The patient's mother "[name]" called from [phone] and stated she would like to talk to the psychiatrist.  She stated "I am escalating" because I am concerned about you releasing my daughter when she is homeless and still delusional.  The caller stated that her daughter pretends to be fine and is not.  This Clinical research associate informed the caller of confidentiality procedures and caller stated "I know about HIPAA" and "I need to talk with someone about my daughter's situation."  The caller did not have the Patient's security code and this Clinical research associate informed that caller that information could not be released without a signed consent or legal guardianship / POA.  This Clinical research associate informed the caller that information would be passed along to the provider via the chart.

## 2023-05-16 NOTE — Group Note (Signed)
Date:  05/16/2023 Time:  9:20 PM  Group Topic/Focus:  Wrap-Up Group:   The focus of this group is to help patients review their daily goal of treatment and discuss progress on daily workbooks.    Participation Level:  Minimal  Participation Quality:  Appropriate  Affect:  Appropriate  Cognitive:  Appropriate  Insight: Appropriate  Engagement in Group:  None  Modes of Intervention:  Clarification  Additional Comments:     Maglione,Earl Zellmer E 05/16/2023, 9:20 PM  

## 2023-05-16 NOTE — Group Note (Signed)
Date:  05/16/2023 Time:  3:27 PM  Group Topic/Focus:  Community Group:   The goal of this group is to inform the patients of the rules and policies of the unit, who to notify and talk to when there is a concern, remind them of the availability of the staff when there is a request or issue.  We also utilize the beach ball which has different ways to express their feelings and moods they are currently experiencing.   Participation Level:  Active  Participation Quality:  Appropriate  Affect:  Appropriate  Cognitive:  Appropriate  Insight: Appropriate  Engagement in Group:  Engaged  Modes of Intervention:  Activity  Additional Comments:    Mary Sella Farley Crooker 05/16/2023, 3:27 PM

## 2023-05-16 NOTE — Progress Notes (Signed)
The patient rested/slept in bed throughout the shift.  This Clinical research associate informed the patient that her medications were due and the patient said that she did not want to take them.

## 2023-05-16 NOTE — Progress Notes (Signed)
[name] called voicing her concerns, stating that patient needs a 30-day facility once leaving the hospital. She also stated that patient needs a case worker because patient has nowhere to go upon discharge. [name] is concerned that patient's mental health is declining and that patient can't be her own advocate.

## 2023-05-16 NOTE — Group Note (Signed)
[facility] LCSW Group Therapy Note   Group Date: 05/16/2023 Start Time: 1310 End Time: 1400   Type of Therapy/Topic:  Group Therapy:  Balance in Life  Participation Level:  None   Description of Group:    This group will address the concept of balance and how it feels and looks when one is unbalanced. Patients will be encouraged to process areas in their lives that are out of balance, and identify reasons for remaining unbalanced. Facilitators will guide patients utilizing problem- solving interventions to address and correct the stressor making their life unbalanced. Understanding and applying boundaries will be explored and addressed for obtaining  and maintaining a balanced life. Patients will be encouraged to explore ways to assertively make their unbalanced needs known to significant others in their lives, using other group members and facilitator for support and feedback.  Therapeutic Goals: Patient will identify two or more emotions or situations they have that consume much of in their lives. Patient will identify signs/triggers that life has become out of balance:  Patient will identify two ways to set boundaries in order to achieve balance in their lives:  Patient will demonstrate ability to communicate their needs through discussion and/or role plays  Summary of Patient Progress: Patient came into group late and then left after approximately five minutes. She did not participate in the discussion.    Therapeutic Modalities:   Cognitive Behavioral Therapy Solution-Focused Therapy Assertiveness Training   Glenis Smoker, LCSW

## 2023-05-16 NOTE — Plan of Care (Signed)
D- Patient alert and oriented. Patient presents in a pleasant mood on assessment stating that she slept "like a baby" last night and had no complaints to voice to this Clinical research associate. Patient denies SI, HI, AVH, and pain at this time. Patient also denies any signs/symptoms of depression and anxiety, stating "not in the mornings, it's all in the evenings". Patient had no stated goals for today.  A- Scheduled medications administered to patient, per MD orders. Support and encouragement provided.  Routine safety checks conducted every 15 minutes.  Patient informed to notify staff with problems or concerns.  R- No adverse drug reactions noted. Patient contracts for safety at this time. Patient compliant with medications and treatment plan. Patient receptive, calm, and cooperative. Patient interacts well with others on the unit. Patient remains safe at this time.  Problem: Education: Goal: Knowledge of General Education information will improve Description: Including pain rating scale, medication(s)/side effects and non-pharmacologic comfort measures Outcome: Progressing   Problem: Health Behavior/Discharge Planning: Goal: Ability to manage health-related needs will improve Outcome: Progressing   Problem: Clinical Measurements: Goal: Ability to maintain clinical measurements within normal limits will improve Outcome: Progressing Goal: Will remain free from infection Outcome: Progressing Goal: Diagnostic test results will improve Outcome: Progressing Goal: Respiratory complications will improve Outcome: Progressing Goal: Cardiovascular complication will be avoided Outcome: Progressing   Problem: Activity: Goal: Risk for activity intolerance will decrease Outcome: Progressing   Problem: Nutrition: Goal: Adequate nutrition will be maintained Outcome: Progressing   Problem: Coping: Goal: Level of anxiety will decrease Outcome: Progressing   Problem: Elimination: Goal: Will not experience  complications related to bowel motility Outcome: Progressing Goal: Will not experience complications related to urinary retention Outcome: Progressing   Problem: Pain Managment: Goal: General experience of comfort will improve Outcome: Progressing   Problem: Safety: Goal: Ability to remain free from injury will improve Outcome: Progressing   Problem: Skin Integrity: Goal: Risk for impaired skin integrity will decrease Outcome: Progressing   Problem: Education: Goal: Knowledge of [place] [facility] Education information/materials will improve Outcome: Progressing Goal: Emotional status will improve Outcome: Progressing Goal: Mental status will improve Outcome: Progressing Goal: Verbalization of understanding the information provided will improve Outcome: Progressing   Problem: Safety: Goal: Periods of time without injury will increase Outcome: Progressing   Problem: Self-Concept: Goal: Ability to identify factors that promote anxiety will improve Outcome: Progressing Goal: Level of anxiety will decrease Outcome: Progressing Goal: Ability to modify response to factors that promote anxiety will improve Outcome: Progressing

## 2023-05-16 NOTE — Progress Notes (Signed)
[facility] MD Progress Note  05/16/2023 7:41 AM Ana Best  MRN:  [id number] Subjective:.    6/25 Patient refused her nighttime medications due to feeling very tired.  Overall she felt extremely tired yesterday and felt it was secondary to her antibiotics for her here ears.  Today she feels well rested, slept very well last night.  He has been calm and appropriate on the unit.  We did inform her that her mother had called but patient does not want [place] to communicate with her mother.  Indicates that her mother lies.  States that she never destroyed her apartment as her mother alleges.  She does agree that she took a hammer to the door and had dented--"by the way, this was a long time ago, anyway."  Reports that her ears are feeling much better.  Her energy is also better.  She likes the Risperdal and that it helps her sleep much better at night as she does have a longstanding history of this problem.  Patient denies abnormal movements tics or tremors on the medication.  She denies any stressors.  No conflicts on the unit.  No new medical issues.  She denies hallucinations or paranoia.  6/24 Patient reports no mood fluctuations.  No crying spells or outburst.  No inward anger.  Denies racing thoughts or psychosis.  No signs or symptoms of hypomania or mania.  She slept well last night.  We discussed the risks of Depakote in a reproductive female.  Says that she does not want her contraceptives especially the Depo shot.  Indicates that she is in no relationship and is not sexually active.  She is agreeable eventually just to be on Risperdal in that case however we will still get a Depakote level on Wednesday.  She has been calm and cooperative.  Reports having no ear pain now, just feels more tired.  6/23 Patient reports having a relatively rough day yesterday due to having a panic attack was brought on by something that she saw on TV but does not recall at this time.  Also reports that that would  noises trigger her as well.  States that her ears were also hurting.  Hopeful to attend more groups today to distract from negative thoughts.  She tolerated medication well.  No racing thoughts hallucinations or paranoia.  Eating well.  No conflicts on the unit.  6/22 Patient is being seen for the first time today.  She recalls the events that had transpired prior to her admission.  Says that her dog ran outside [place] the patient showered and had a towel around her.  "This made me look like a crazy person."  Says that she was scared and so far police not usually a violent person.  Says that she had no suicidal thoughts and actually did not wrap a towel around her neck.  She denies recent mania or psychosis.  Eating well sleeping well.  Taking her medications.  At home her water has been cut off and her air conditioner is not working. She was showering at her local gym.   Principal Problem: Bipolar 1 disorder (HCC) Diagnosis: Principal Problem:   Bipolar 1 disorder (HCC) Active Problems:   BOM (bilateral otitis media)  Total Time spent with patient: 28 mins  Past Psychiatric History: Past history of bipolar disorder or schizoaffective disorder.  Past Medical History:  Past Medical History:  Diagnosis Date   Anxiety    Bipolar 1 disorder (HCC)  Depression    Hypertension     Past Surgical History:  Procedure Laterality Date   DILATION AND CURETTAGE OF UTERUS     TONSILLECTOMY     WISDOM TOOTH EXTRACTION     Family History:  Family History  Problem Relation Age of Onset   Depression Father    Anxiety disorder Father    ADD / ADHD Brother    Family Psychiatric  History: See previous Social History:  Social History   Substance and Sexual Activity  Alcohol Use Yes   Comment: Pt denies substance use     Social History   Substance and Sexual Activity  Drug Use Yes   Types: Marijuana   Comment: Pt denies substance use    Social History    Socioeconomic History   Marital status: Single    Spouse name: Not on file   Number of children: Not on file   Years of education: Not on file   Highest education level: Not on file  Occupational History   Not on file  Tobacco Use   Smoking status: Former    Packs/day: 0.50    Years: 10.00    Additional pack years: 0.00    Total pack years: 5.00    Types: Cigarettes    Quit date: 03/08/2014    Years since quitting: 9.1   Smokeless tobacco: Never  Vaping Use   Vaping Use: Never used  Substance and Sexual Activity   Alcohol use: Yes    Comment: Pt denies substance use   Drug use: Yes    Types: Marijuana    Comment: Pt denies substance use   Sexual activity: Yes    Partners: Male    Comment: number of sex partners in the last 12 months  1  Other Topics Concern   Not on file  Social History Narrative   ** Merged History Encounter **       Social Determinants of Health   Financial Resource Strain: Not on file  Food Insecurity: Food Insecurity Present (05/11/2023)   Hunger Vital Sign    Worried About Running Out of Food in the Last Year: Often true    Ran Out of Food in the Last Year: Often true  Transportation Needs: No Transportation Needs (05/11/2023)   PRAPARE - Administrator, Civil Service (Medical): No    Lack of Transportation (Non-Medical): No  Physical Activity: Not on file  Stress: Not on file  Social Connections: Not on file   Additional Social History:                         Sleep: Fair  Appetite:  Fair  Current Medications: Current Facility-Administered Medications  Medication Dose Route Frequency Provider Last Rate Last Admin   acetaminophen (TYLENOL) tablet 650 mg  650 mg Oral Q6H PRN Motley-Mangrum, Jadeka A, PMHNP   650 mg at 05/13/23 1516   alum & mag hydroxide-simeth (MAALOX/MYLANTA) 200-200-20 MG/5ML suspension 30 mL  30 mL Oral Q4H PRN Motley-Mangrum, Jadeka A, PMHNP       amoxicillin-clavulanate (AUGMENTIN) 875-125  MG per tablet 1 tablet  1 tablet Oral Q12H Floydene Flock, MD   1 tablet at 05/15/23 0825   bacitracin ointment   Topical BID Motley-Mangrum, Jadeka A, PMHNP   1 Application at 05/15/23 0826   diphenhydrAMINE (BENADRYL) capsule 50 mg  50 mg Oral TID PRN Motley-Mangrum, Jadeka A, PMHNP   50 mg at 05/14/23 1902  Or   diphenhydrAMINE (BENADRYL) injection 50 mg  50 mg Intramuscular TID PRN Motley-Mangrum, Jadeka A, PMHNP       divalproex (DEPAKOTE) DR tablet 500 mg  500 mg Oral Q12H Clapacs, John T, MD   500 mg at 05/15/23 0825   haloperidol (HALDOL) tablet 5 mg  5 mg Oral TID PRN Motley-Mangrum, Ezra Sites, PMHNP   5 mg at 05/14/23 1902   Or   haloperidol lactate (HALDOL) injection 5 mg  5 mg Intramuscular TID PRN Motley-Mangrum, Geralynn Ochs A, PMHNP       hydrOXYzine (ATARAX) tablet 50 mg  50 mg Oral TID PRN Motley-Mangrum, Jadeka A, PMHNP   50 mg at 05/12/23 1509   LORazepam (ATIVAN) tablet 2 mg  2 mg Oral TID PRN Motley-Mangrum, Jadeka A, PMHNP   2 mg at 05/15/23 1540   Or   LORazepam (ATIVAN) injection 2 mg  2 mg Intramuscular TID PRN Motley-Mangrum, Jadeka A, PMHNP       magnesium hydroxide (MILK OF MAGNESIA) suspension 30 mL  30 mL Oral Daily PRN Motley-Mangrum, Jadeka A, PMHNP       neomycin-polymyxin-hydrocortisone (CORTISPORIN) OTIC (EAR) solution 4 drop  4 drop Both EARS Q6H Floydene Flock, MD   4 drop at 05/15/23 1205   nicotine (NICODERM CQ - dosed in mg/24 hours) patch 21 mg  21 mg Transdermal Daily Clapacs, Jackquline Denmark, MD   21 mg at 05/15/23 0825   nicotine polacrilex (NICORETTE) gum 2 mg  2 mg Oral PRN Clapacs, Jackquline Denmark, MD   2 mg at 05/15/23 1345   risperiDONE (RISPERDAL) tablet 3 mg  3 mg Oral QHS Reggie Pile, MD        Lab Results:  No results found for this or any previous visit (from the past 48 hour(s)).   Blood Alcohol level:  Lab Results  Component Value Date   ETH <10 05/06/2023   ETH <10 09/20/2022    Metabolic Disorder Labs: Lab Results  Component Value Date   HGBA1C  4.9 05/12/2023   MPG 93.93 05/12/2023   MPG 88.19 09/24/2022   No results found for: "PROLACTIN" Lab Results  Component Value Date   CHOL 156 05/12/2023   TRIG 132 05/12/2023   HDL 41 05/12/2023   CHOLHDL 3.8 05/12/2023   VLDL 26 05/12/2023   LDLCALC 89 05/12/2023   LDLCALC 94 09/24/2022    Physical Findings: AIMS:  , ,  ,  ,    CIWA:    COWS:     Musculoskeletal: Strength & Muscle Tone: within normal limits Gait & Station: normal Patient leans: N/A  Psychiatric Specialty Exam:  Presentation  General Appearance:  Appropriate for Environment  Eye Contact: Good  Speech: Clear and Coherent  Speech Volume: Normal  Handedness: Right   Mood and Affect  Mood: anxious  Affect: Appropriate   Thought Process  Thought Processes: Coherent  Descriptions of Associations:Intact  Orientation:Full (Time, Place and Person)  Thought Content:Logical  History of Schizophrenia/Schizoaffective disorder: Hallucinations:No data recorded Ideas of Reference:None  Suicidal Thoughts:none  Sensorium  Memory: Immediate Good  Judgment: Fair  Insight: fair   Executive Functions  Concentration: Fair  Attention Span: Fair  Recall: [employer] of Knowledge: Fair  Language: Fair   Psychomotor Activity  Psychomotor Activity:No data recorded  Assets  Assets: Communication Skills; Social Support  Physical Exam: Physical Exam Vitals and nursing note reviewed.  Constitutional:      Appearance: Normal appearance.  HENT:     Head:  Normocephalic and atraumatic.     Mouth/Throat:     Pharynx: Oropharynx is clear.  Eyes:     Pupils: Pupils are equal, round, and reactive to light.  Cardiovascular:     Rate and Rhythm: Normal rate and regular rhythm.  Pulmonary:     Effort: Pulmonary effort is normal.     Breath sounds: Normal breath sounds.  Abdominal:     General: Abdomen is flat.     Palpations: Abdomen is soft.  Musculoskeletal:         General: Normal range of motion.  Skin:    General: Skin is warm and dry.  Neurological:     General: No focal deficit present.     Mental Status: She is alert. Mental status is at baseline.  Psychiatric:        Attention and Perception: Attention normal.        Mood and Affect: Mood is elated.        Speech: Speech normal.        Behavior: Behavior normal.        Thought Content: Thought content normal.        Cognition and Memory: Cognition normal.    Review of Systems  Constitutional: Negative.   HENT: Negative.    Eyes: Negative.   Respiratory: Negative.    Cardiovascular: Negative.   Gastrointestinal: Negative.   Musculoskeletal: Negative.   Skin: Negative.   Neurological: Negative.   Psychiatric/Behavioral: Negative.     Blood pressure 122/76, pulse 91, temperature 98.2 F (36.8 C), temperature source Oral, resp. rate 16, height 5\' 7"  (1.702 m), weight 68 kg, last menstrual period 04/24/2023, SpO2 100 %. Body mass index is 23.49 kg/m.   Treatment Plan Summary: Medication management and Plan patient is on Depakote and Risperdal.  Tolerating medicine fine.  No active behavior problems.  Consultation requested from the hospitalist service to take a look at her ears.  Blood pressure is on the low side but the patient is not dizzy or complaining of any symptoms from it.  No change otherwise for treatment.  Pending the results of the consult to see if we need to do anything different for her ears.  6/25 Depakote level tomorrow Possible discharge in 1 to 2 days Increase Risperdal to 3 mg nightly Patient makes it clear that she does not want [place] to speak to her mother.  She does not provide consent.  6/24 Risks of Depakote reviewed in reproductive females. Discharge on Wednesday Likely will not discharge patient on Depakote now that we have stabilized her on this in addition to Risperdal due to her not wanting to be on oral contraceptive.  6/23 Depakote level Wednesday  morning Likely discharge on Wednesday  6/22 No changes  Reggie Pile, MD 05/16/2023, 7:41 AM

## 2023-05-16 NOTE — Progress Notes (Signed)
Patient calm and pleasant during assessment denying SI/HI/AVH. Pt observed interacting appropriately with staff and peers on the unit. Pt compliant with medication administration per MD orders. Pt given education, support, and encouragement to be active in her treatment plan. Pt being monitored Q 15 minutes for safety per unit protocol, remains safe on the unit   

## 2023-05-16 NOTE — Group Note (Signed)
Recreation Therapy Group Note   Group Topic:General Recreation  Group Date: 05/16/2023 Start Time: 1000 End Time: 1050 Facilitators: Rosina Lowenstein, LRT, CTRS Location: Courtyard  Group Description: Outdoor Recreation. Patients had the option to play basketball, draw with sidewalk chalk, play with a deck of cards while outside in the courtyard getting fresh air and sunlight. LRT played music in the background through a speaker. LRT and pts discussed things that they enjoy doing in their free time outside of the hospital. Goal Area(s) Addressed: Patient will identify leisure interests.  Patient will practice healthy decision making. Patient will engage in recreation activity.  Affect/Mood: Appropriate and Happy   Participation Level: Active and Engaged   Participation Quality: Independent   Behavior: Appropriate and Eager   Speech/Thought Process: Coherent   Insight: Good   Judgement: Good   Modes of Intervention: Activity   Patient Response to Interventions:  Attentive, Engaged, Interested , and Receptive   Education Outcome:  Acknowledges education   Clinical Observations/Individualized Feedback: [place] was active in their participation of session activities and group discussion. Pt played basketball and talked with peers while outside in the courtyard. Pt interacted well with LRT and peers duration of session.   Plan: Continue to engage patient in RT group sessions 2-3x/week.   Rosina Lowenstein, LRT, CTRS 05/16/2023 11:37 AM

## 2023-05-16 NOTE — Progress Notes (Signed)
   05/16/23 0000  Psych Admission Type (Psych Patients Only)  Admission Status Involuntary  Psychosocial Assessment  Patient Complaints None  Eye Contact None  Facial Expression Other (Comment) (asleep)  Speech UTA  Interaction Isolative  Appearance/Hygiene Unremarkable  Mood Other (Comment) (asleep)  Thought Process  Coherency Unable to assess  Content UTA  Delusions UTA  Perception UTA  Hallucination UTA  Judgment UTA  Confusion UTA  Danger to Others  Danger to Others None reported or observed

## 2023-05-17 DIAGNOSIS — F319 Bipolar disorder, unspecified: Secondary | ICD-10-CM | POA: Diagnosis not present

## 2023-05-17 LAB — VALPROIC ACID LEVEL: Valproic Acid Lvl: 54 ug/mL (ref 50.0–100.0)

## 2023-05-17 MED ORDER — DIVALPROEX SODIUM 500 MG PO DR TAB: 500.0000 mg | DELAYED_RELEASE_TABLET | Freq: Two times a day (BID) | ORAL | 0 refills | Status: DC

## 2023-05-17 MED ORDER — HYDROXYZINE HCL 50 MG PO TABS: 50.0000 mg | ORAL_TABLET | Freq: Three times a day (TID) | ORAL | 0 refills | Status: AC | PRN

## 2023-05-17 MED ORDER — AMOXICILLIN-POT CLAVULANATE 875-125 MG PO TABS: 1.0000 | ORAL_TABLET | Freq: Two times a day (BID) | ORAL | 0 refills | Status: AC

## 2023-05-17 MED ORDER — RISPERIDONE 3 MG PO TABS: 3.0000 mg | ORAL_TABLET | Freq: Every day | ORAL | 0 refills | Status: AC

## 2023-05-17 NOTE — Discharge Summary (Signed)
Physician Discharge Summary Note  Patient:  Ana Best is an 37 y.o., female MRN:  [id number] DOB:  1986-07-14 Patient phone:  [phone] (home)  Patient address:   [address] [place] [address],  Total Time spent with patient: 30 minutes  History of Present Illness:patient seen and chart reviewed this is a 37 year old woman who was brought to [facility] on the 15th after being picked up by law enforcement behaving erratically.  Running down the road with a sheet wrapped around her.  Patient was described as being hostile and belligerent and erratic when she was in the emergency room at [facility].  At 1 point it is alleged that she wrapped a sheet around her throat and what was interpreted as being a suicide threat.  Patient was documented to have calmed down as of today and was transferred to our hospital.  Patient tells me this is all a misunderstanding.  She says that her dog had escaped and she was afraid it would get hit by a car and so she was running down the street.  She was naked at the time and so she only had a sheet wrapped around her.  Patient says the reason that she was hostile was that she was startled.  There were some mentions of psychotic symptoms in the chart the patient denies those.  She denies that she wrapped a sheet around her neck or tried to harm herself.  She currently denies any symptoms denying depression anger or mood instability.  Denies hallucinations.  Denies delusions.  Denies suicidal or homicidal ideation.  She does seem a little distant and peculiar at times.  She is focused on the idea that she has a bilateral ear infection that is causing her to feel off balance and so has stuffed wads of paper into her ears.  She does not however make any obviously delusional statements.  She says she smokes marijuana heavily at home but denies any other recent drug use.  She has not been going for any follow-up outpatient mental health care.   Hospital  course During the patient's admission, she was started on Depakote 500 mg twice daily. Her Depakote level today, May 17, 2023 is 54. She is noted to be on Risperdal 3 mg which was recently increased. She declined being on long-acting injectable though it was recommended. She also declined being on any birth control. The reproductive effects of Depakote in a woman her age were reviewed and the need for contraception. Since she has declined, we will discontinue the Depakote as she has been stabilized on this medication. She will continue Risperdal. Hyperprolactinemia and galactorrhea EPS NMS weight gain and sedation were reviewed if she is tolerating this medication well. Encouraged medication compliance in the absence of drugs and alcohol. She is not manic or psychotic at this time. She is alert and oriented x 3 and does have capacity to make her own decisions. If the patient develops suicidal homicidal uses, or severe decline in functioning please call 911 or go to the local emergency department. Patient voices agreement and understanding.     Date of Admission:  05/11/2023    Principal Problem: Bipolar 1 disorder [facility]) Discharge Diagnoses: Principal Problem:   Bipolar 1 disorder (HCC) Active Problems:   BOM (bilateral otitis media)   Past Medical History:  Past Medical History:  Diagnosis Date   Anxiety    Bipolar 1 disorder (HCC)    Depression    Hypertension  Past Surgical History:  Procedure Laterality Date   DILATION AND CURETTAGE OF UTERUS     TONSILLECTOMY     WISDOM TOOTH EXTRACTION     Family History:  Family History  Problem Relation Age of Onset   Depression Father    Anxiety disorder Father    ADD / ADHD Brother     Social History:  Social History   Substance and Sexual Activity  Alcohol Use Yes   Comment: Pt denies substance use     Social History   Substance and Sexual Activity  Drug Use Yes   Types: Marijuana   Comment: Pt denies substance use     Social History   Socioeconomic History   Marital status: Single    Spouse name: Not on file   Number of children: Not on file   Years of education: Not on file   Highest education level: Not on file  Occupational History   Not on file  Tobacco Use   Smoking status: Former    Packs/day: 0.50    Years: 10.00    Additional pack years: 0.00    Total pack years: 5.00    Types: Cigarettes    Quit date: 03/08/2014    Years since quitting: 9.1   Smokeless tobacco: Never  Vaping Use   Vaping Use: Never used  Substance and Sexual Activity   Alcohol use: Yes    Comment: Pt denies substance use   Drug use: Yes    Types: Marijuana    Comment: Pt denies substance use   Sexual activity: Yes    Partners: Male    Comment: number of sex partners in the last 12 months  1  Other Topics Concern   Not on file  Social History Narrative   ** Merged History Encounter **       Social Determinants of Health   Financial Resource Strain: Not on file  Food Insecurity: Food Insecurity Present (05/11/2023)   Hunger Vital Sign    Worried About Running Out of Food in the Last Year: Often true    Ran Out of Food in the Last Year: Often true  Transportation Needs: No Transportation Needs (05/11/2023)   PRAPARE - Administrator, Civil Service (Medical): No    Lack of Transportation (Non-Medical): No  Physical Activity: Not on file  Stress: Not on file  Social Connections: Not on file     Physical Findings: AIMS:  , ,  ,  ,    CIWA:    COWS:     Musculoskeletal: Strength & Muscle Tone: within normal limits Gait & Station: normal Patient leans: Right   Psychiatric Specialty Exam:  Presentation  General Appearance:  Appropriate for Environment  Eye Contact: Good  Speech: Clear and Coherent  Speech Volume: Normal  Handedness: Right   Mood and Affect  Mood: Euthymic  Affect: Appropriate   Thought Process  Thought Processes: Coherent  Descriptions of  Associations:Intact  Orientation:Full (Time, Place and Person)  Thought Content:Logical  History of Schizophrenia/Schizoaffective disorder:No  Duration of Psychotic Symptoms:N/A  Hallucinations:No data recorded Ideas of Reference:None  Suicidal Thoughts:No data recorded Homicidal Thoughts:No data recorded  Sensorium  Memory: Immediate Good  Judgment: Fair  Insight: Lacking   Executive Functions  Concentration: Fair  Attention Span: Fair  Recall: [employer] of Knowledge: Fair  Language: Fair   Psychomotor Activity  Psychomotor Activity:No data recorded  Assets  Assets: Communication Skills; Social Support   Sleep  Sleep:No  data recorded   Physical Exam: Physical Exam ROS Blood pressure (!) 95/57, pulse 79, temperature 98.9 F (37.2 C), resp. rate 20, height 5\' 7"  (1.702 m), weight 68 kg, last menstrual period 04/24/2023, SpO2 97 %. Body mass index is 23.49 kg/m.   Social History   Tobacco Use  Smoking Status Former   Packs/day: 0.50   Years: 10.00   Additional pack years: 0.00   Total pack years: 5.00   Types: Cigarettes   Quit date: 03/08/2014   Years since quitting: 9.1  Smokeless Tobacco Never   Tobacco Cessation:  N/A, patient does not currently use tobacco products   Blood Alcohol level:  Lab Results  Component Value Date   ETH <10 05/06/2023   ETH <10 09/20/2022    Metabolic Disorder Labs:  Lab Results  Component Value Date   HGBA1C 4.9 05/12/2023   MPG 93.93 05/12/2023   MPG 88.19 09/24/2022   No results found for: "PROLACTIN" Lab Results  Component Value Date   CHOL 156 05/12/2023   TRIG 132 05/12/2023   HDL 41 05/12/2023   CHOLHDL 3.8 05/12/2023   VLDL 26 05/12/2023   LDLCALC 89 05/12/2023   LDLCALC 94 09/24/2022    See Psychiatric Specialty Exam and Suicide Risk Assessment completed by Attending Physician prior to discharge.  Discharge destination:  Home  Is patient on multiple antipsychotic  therapies at discharge:  No   Has Patient had three or more failed trials of antipsychotic monotherapy by history:  No  Recommended Plan for Multiple Antipsychotic Therapies: NA  Discharge Instructions     Diet - low sodium heart healthy   Complete by: As directed    Increase activity slowly   Complete by: As directed       Allergies as of 05/17/2023   Not on File      Medication List     STOP taking these medications    bacitracin ointment   benztropine 1 MG tablet Commonly known as: COGENTIN   cariprazine 1.5 MG capsule Commonly known as: VRAYLAR   traZODone 50 MG tablet Commonly known as: DESYREL       TAKE these medications      Indication  amoxicillin-clavulanate 875-125 MG tablet Commonly known as: AUGMENTIN Take 1 tablet by mouth every 12 (twelve) hours for 2 days. What changed: when to take this  Indication: Ear infection   hydrOXYzine 50 MG tablet Commonly known as: ATARAX Take 1 tablet (50 mg total) by mouth 3 (three) times daily as needed for anxiety. What changed:  medication strength how much to take  Indication: Feeling Anxious   risperiDONE 3 MG tablet Commonly known as: RISPERDAL Take 1 tablet (3 mg total) by mouth at bedtime. What changed: Another medication with the same name was removed. Continue taking this medication, and follow the directions you see here.  Indication: Hypomanic Episode of Bipolar Disorder        Follow-up Information     [facility] Follow up.   Specialty: Behavioral Health Why: Although you plan to schedule your own appointment with your outpatient provider, here is a resource in the community that has walk-in hours on Mondays, Wednesdays, and Thursdays from 8am. Clients are seen on a first come, first served basis. It is recommended that you arrive prior to 7:30am, sign in at the front desk, and they will begin seeing clients at 8am. Thanks! Contact information: [address] [place] [address] [phone]  Signed: Reggie Pile, MD 05/17/2023, 12:12 PM

## 2023-05-17 NOTE — Progress Notes (Signed)
  [facility] Adult Case Management Discharge Plan :  Will you be returning to the same living situation after discharge:  Yes,  pt plans to return home upon discharge. At discharge, do you have transportation home?: Yes,  CSW will assist with transportation arrangements. Do you have the ability to pay for your medications: Yes,  [facility].  Release of information consent forms completed and in the chart;  Patient's signature needed at discharge.  Patient to Follow up at:  Follow-up Information     [facility] Follow up.   Specialty: Behavioral Health Why: Although you plan to schedule your own appointment with your outpatient provider, here is a resource in the community that has walk-in hours on Mondays, Wednesdays, and Thursdays from 8am. Clients are seen on a first come, first served basis. It is recommended that you arrive prior to 7:30am, sign in at the front desk, and they will begin seeing clients at 8am. Thanks! Contact information: [phone] [place] [address] [phone]                Next level of care provider has access to [facility] Link:no  Safety Planning and Suicide Prevention discussed: Yes,  SPE completed with pt.     Has patient been referred to the Quitline?: Patient refused referral for treatment  Patient has been referred for addiction treatment: Patient refused referral for treatment.  Glenis Smoker, LCSW 05/17/2023, 11:03 AM

## 2023-05-17 NOTE — BHH Suicide Risk Assessment (Signed)
[facility] Discharge Suicide Risk Assessment   Principal Problem: Bipolar 1 disorder [facility]) Discharge Diagnoses: Principal Problem:   Bipolar 1 disorder (HCC) Active Problems:   BOM (bilateral otitis media)   Total Time spent with patient: 30 minutes  Hospital course During the patient's admission, she was started on Depakote 500 mg twice daily. Her Depakote level today, May 17, 2023 is 54. She is noted to be on Risperdal 3 mg which was recently increased. She declined being on long-acting injectable though it was recommended. She also declined being on any birth control. The reproductive effects of Depakote in a woman her age were reviewed and the need for contraception. Since she has declined, we will discontinue the Depakote as she has been stabilized on this medication. She will continue Risperdal. Hyperprolactinemia and galactorrhea EPS NMS weight gain and sedation were reviewed if she is tolerating this medication well. Encouraged medication compliance in the absence of drugs and alcohol. She is not manic or psychotic at this time. She is alert and oriented x 3 and does have capacity to make her own decisions. If the patient develops suicidal homicidal uses, or severe decline in functioning please call 911 or go to the local emergency department. Patient voices agreement and understanding.    Musculoskeletal: Strength & Muscle Tone: within normal limits Gait & Station: normal Patient leans: Right  Psychiatric Specialty Exam  Presentation  General Appearance:  Appropriate for Environment  Eye Contact: Good  Speech: Clear and Coherent  Speech Volume: Normal  Handedness: Right   Mood and Affect  Mood: Euthymic  Duration of Depression Symptoms: No data recorded Affect: Appropriate   Thought Process  Thought Processes: Coherent  Descriptions of Associations:Intact  Orientation:Full (Time, Place and Person)  Thought Content:Logical  History of  Schizophrenia/Schizoaffective disorder:No  Duration of Psychotic Symptoms:N/A  Hallucinations:No data recorded Ideas of Reference:None  Suicidal Thoughts:No data recorded Homicidal Thoughts:No data recorded  Sensorium  Memory: Immediate Good  Judgment: Fair  Insight: Lacking   Executive Functions  Concentration: Fair  Attention Span: Fair  Recall: [employer] of Knowledge: Fair  Language: Fair   Psychomotor Activity  Psychomotor Activity:No data recorded  Assets  Assets: Communication Skills; Social Support   Sleep  Sleep:No data recorded  Physical Exam: Physical Exam ROS Blood pressure (!) 95/57, pulse 79, temperature 98.9 F (37.2 C), resp. rate 20, height 5\' 7"  (1.702 m), weight 68 kg, last menstrual period 04/24/2023, SpO2 97 %. Body mass index is 23.49 kg/m.  Mental Status Per Nursing Assessment::   On Admission:  NA    Cognitive Features That Contribute To Risk:  None    Suicide Risk:  Minimal: No identifiable suicidal ideation.  Patients presenting with no risk factors but with morbid ruminations; may be classified as minimal risk based on the severity of the depressive symptoms   Follow-up Information     [facility] Follow up.   Specialty: Behavioral Health Why: Although you plan to schedule your own appointment with your outpatient provider, here is a resource in the community that has walk-in hours on Mondays, Wednesdays, and Thursdays from 8am. Clients are seen on a first come, first served basis. It is recommended that you arrive prior to 7:30am, sign in at the front desk, and they will begin seeing clients at 8am. Thanks! Contact information: [address] [place] [address] [phone]                 Reggie Pile, MD 05/17/2023,  12:09 PM

## 2023-05-17 NOTE — Progress Notes (Signed)
Patient discharged to home via public taxi. All discharge instructions given to patient and read with understanding. All belongings given to patient and acknowledges receipt. Tolerated al medications and meals prior to discharge.

## 2023-05-17 NOTE — BHH Suicide Risk Assessment (Deleted)
[facility] Admission Suicide Risk Assessment   Nursing information obtained from:  Patient Demographic factors:  Caucasian, Low socioeconomic status, Living alone    Total Time spent with patient: 22 minutes Principal Problem: Bipolar 1 disorder (HCC) Diagnosis:  Principal Problem:   Bipolar 1 disorder (HCC) Active Problems:   BOM (bilateral otitis media)  Subjective Data:  During the patient's admission, she was started on Depakote 500 mg twice daily.  Her Depakote level today, May 17, 2023 is 54.  She is noted to be on Risperdal 3 mg which was recently increased.  She declined being on long-acting injectable though it was recommended.  She also declined being on any birth control.  The reproductive effects of Depakote in a woman her age were reviewed and the need for contraception.  Since she has declined, we will discontinue the Depakote as she has been stabilized on this medication.  She will continue Risperdal.  Hyperprolactinemia and galactorrhea EPS NMS weight gain and sedation were reviewed if she is tolerating this medication well.  Encouraged medication compliance in the absence of drugs and alcohol.  She is not manic or psychotic at this time.  She is alert and oriented x 3 and does have capacity to make her own decisions.  If the patient develops suicidal homicidal uses, or severe decline in functioning please call 911 or go to the local emergency department.  Patient voices agreement and understanding.   Continued Clinical Symptoms:  Alcohol Use Disorder Identification Test Final Score (AUDIT): 2 The "Alcohol Use Disorders Identification Test", Guidelines for Use in Primary Care, Second Edition.  World Science writer [facility]). Score between 0-7:  no or low risk or alcohol related problems. Score between 8-15:  moderate risk of alcohol related problems. Score between 16-19:  high risk of alcohol related problems. Score 20 or above:  warrants further diagnostic evaluation for alcohol  dependence and treatment.   CLINICAL FACTORS:   Bipolar Disorder:   Mixed State   Musculoskeletal: Strength & Muscle Tone: within normal limits Gait & Station: normal Patient leans: Right  Psychiatric Specialty Exam:  Presentation  General Appearance:  Appropriate for Environment  Eye Contact: Good  Speech: Clear and Coherent  Speech Volume: Normal  Handedness: Right   Mood and Affect  Mood: Euthymic  Affect: Appropriate   Thought Process  Thought Processes: Coherent  Descriptions of Associations:Intact  Orientation:Full (Time, Place and Person)  Thought Content:Logical  History of Schizophrenia/Schizoaffective disorder:No  Duration of Psychotic Symptoms:N/A  Hallucinations:No data recorded Ideas of Reference:None  Suicidal Thoughts:No data recorded Homicidal Thoughts:No data recorded  Sensorium  Memory: Immediate Good  Judgment: Fair  Insight: Lacking   Executive Functions  Concentration: Fair  Attention Span: Fair  Recall: [employer] of Knowledge: Fair  Language: Fair   Psychomotor Activity  Psychomotor Activity:No data recorded  Assets  Assets: Communication Skills; Social Support   Sleep  Sleep:No data recorded   Physical Exam: Physical Exam ROS Blood pressure (!) 95/57, pulse 79, temperature 98.9 F (37.2 C), resp. rate 20, height 5\' 7"  (1.702 m), weight 68 kg, last menstrual period 04/24/2023, SpO2 97 %. Body mass index is 23.49 kg/m.   COGNITIVE FEATURES THAT CONTRIBUTE TO RISK:  None    SUICIDE RISK:   Minimal: No identifiable suicidal ideation.  Patients presenting with no risk factors but with morbid ruminations; may be classified as minimal risk based on the severity of the depressive symptoms  I certify that inpatient services furnished can reasonably be expected to  improve the patient's condition.   Reggie Pile, MD 05/17/2023, 12:04 PM

## 2023-05-17 NOTE — Group Note (Signed)
Recreation Therapy Group Note   Group Topic:Problem Solving  Group Date: 05/17/2023 Start Time: 1000 End Time: 1050 Facilitators: Rosina Lowenstein, LRT, CTRS Location:  Craft Room  Group Description: Life Boat. Patients were given the scenario that they are on a boat that is about to become shipwrecked, leaving them stranded on an [place]. They are asked to make a list of 15 different items that they want to take with them when they are stranded on the [place]. Patients are asked to rank their items from most important to least important, #1 being the most important and #15 being the least. Patients will work individually for the first round to come up with 15 items and then pair up with a peer(s) to condense their list and come up with one list of 15 items between the two of them. Patients or LRT will read aloud the 15 different items to the group after each round. LRT facilitated post-activity processing to discuss how this activity can be used in daily life post discharge.   Goal Area(s) Addressed:  Patient will identify priorities, wants and needs. Patient will communicate with LRT and peers. Patient will work collectively as a Administrator, Civil Service. Patient will work on Product manager.   Affect/Mood: Appropriate   Participation Level: Active and Engaged   Participation Quality: Independent   Behavior: Calm and Cooperative   Speech/Thought Process: Coherent   Insight: Good   Judgement: Good   Modes of Intervention: Activity   Patient Response to Interventions:  Attentive, Engaged, Interested , and Receptive   Education Outcome:  Acknowledges education   Clinical Observations/Individualized Feedback: [place] was active in their participation of session activities and group discussion. Pt identified "bible, knife, my dog, dog food, zip ties" as some of the items she will bring with her on [address] Pt left early and did not return. Pt interacted well with LRT and peers duration  of session.   Plan: Continue to engage patient in RT group sessions 2-3x/week.   Rosina Lowenstein, LRT, CTRS 05/17/2023 11:27 AM
